# Patient Record
Sex: Female | Born: 1955 | Race: Black or African American | Hispanic: No | Marital: Single | Smoking: Current every day smoker
Health system: Southern US, Community
[De-identification: ages and names within clinical notes are randomized; demographics above are authoritative.]

## PROBLEM LIST (undated history)

## (undated) DIAGNOSIS — E669 Obesity, unspecified: Secondary | ICD-10-CM

## (undated) DIAGNOSIS — I10 Essential (primary) hypertension: Secondary | ICD-10-CM

## (undated) DIAGNOSIS — M109 Gout, unspecified: Secondary | ICD-10-CM

## (undated) DIAGNOSIS — E785 Hyperlipidemia, unspecified: Secondary | ICD-10-CM

---

## 1998-04-03 ENCOUNTER — Other Ambulatory Visit: Admission: RE | Admit: 1998-04-03 | Discharge: 1998-04-03 | Payer: Self-pay | Admitting: Family Medicine

## 2001-06-26 ENCOUNTER — Emergency Department (HOSPITAL_COMMUNITY): Admission: EM | Admit: 2001-06-26 | Discharge: 2001-06-26 | Payer: Self-pay | Admitting: Emergency Medicine

## 2001-06-26 ENCOUNTER — Encounter: Payer: Self-pay | Admitting: Emergency Medicine

## 2004-05-17 ENCOUNTER — Ambulatory Visit: Payer: Self-pay | Admitting: Family Medicine

## 2004-05-28 ENCOUNTER — Ambulatory Visit: Payer: Self-pay | Admitting: Family Medicine

## 2004-10-29 ENCOUNTER — Ambulatory Visit: Payer: Self-pay | Admitting: Nurse Practitioner

## 2004-12-30 ENCOUNTER — Ambulatory Visit: Payer: Self-pay | Admitting: Internal Medicine

## 2005-02-13 ENCOUNTER — Ambulatory Visit: Payer: Self-pay | Admitting: Family Medicine

## 2005-02-14 ENCOUNTER — Ambulatory Visit: Payer: Self-pay | Admitting: *Deleted

## 2005-02-24 ENCOUNTER — Ambulatory Visit: Payer: Self-pay | Admitting: Family Medicine

## 2005-05-21 ENCOUNTER — Ambulatory Visit: Payer: Self-pay | Admitting: Family Medicine

## 2005-09-16 ENCOUNTER — Ambulatory Visit: Payer: Self-pay | Admitting: Family Medicine

## 2006-03-23 ENCOUNTER — Ambulatory Visit: Payer: Self-pay | Admitting: Family Medicine

## 2006-07-27 ENCOUNTER — Ambulatory Visit: Payer: Self-pay | Admitting: Family Medicine

## 2006-08-18 ENCOUNTER — Ambulatory Visit: Payer: Self-pay | Admitting: Family Medicine

## 2006-09-25 ENCOUNTER — Emergency Department (HOSPITAL_COMMUNITY): Admission: EM | Admit: 2006-09-25 | Discharge: 2006-09-25 | Payer: Self-pay | Admitting: Emergency Medicine

## 2006-09-25 ENCOUNTER — Ambulatory Visit: Payer: Self-pay | Admitting: Family Medicine

## 2006-10-20 ENCOUNTER — Ambulatory Visit: Payer: Self-pay | Admitting: Family Medicine

## 2006-12-23 ENCOUNTER — Ambulatory Visit: Payer: Self-pay | Admitting: Family Medicine

## 2007-01-18 ENCOUNTER — Ambulatory Visit: Payer: Self-pay | Admitting: Family Medicine

## 2007-03-22 ENCOUNTER — Ambulatory Visit: Payer: Self-pay | Admitting: Internal Medicine

## 2007-07-14 ENCOUNTER — Ambulatory Visit: Payer: Self-pay | Admitting: Internal Medicine

## 2007-09-20 ENCOUNTER — Encounter (INDEPENDENT_AMBULATORY_CARE_PROVIDER_SITE_OTHER): Payer: Self-pay | Admitting: Family Medicine

## 2007-09-20 ENCOUNTER — Ambulatory Visit: Payer: Self-pay | Admitting: Internal Medicine

## 2007-09-20 LAB — CONVERTED CEMR LAB
ALT: 17 units/L (ref 0–35)
AST: 27 units/L (ref 0–37)
Albumin: 4 g/dL (ref 3.5–5.2)
Alkaline Phosphatase: 97 units/L (ref 39–117)
BUN: 34 mg/dL — ABNORMAL HIGH (ref 6–23)
CO2: 22 meq/L (ref 19–32)
Calcium: 9.5 mg/dL (ref 8.4–10.5)
Chloride: 106 meq/L (ref 96–112)
Cholesterol: 170 mg/dL (ref 0–200)
Creatinine, Ser: 0.98 mg/dL (ref 0.40–1.20)
Glucose, Bld: 214 mg/dL — ABNORMAL HIGH (ref 70–99)
HDL: 68 mg/dL (ref >39)
LDL Cholesterol: 75 mg/dL (ref 0–99)
Potassium: 5.2 meq/L (ref 3.5–5.3)
Sodium: 141 meq/L (ref 135–145)
Total Bilirubin: 0.4 mg/dL (ref 0.3–1.2)
Total CHOL/HDL Ratio: 2.5
Total Protein: 6.9 g/dL (ref 6.0–8.3)
Triglycerides: 136 mg/dL (ref <150)
VLDL: 27 mg/dL (ref 0–40)

## 2008-02-14 ENCOUNTER — Encounter (INDEPENDENT_AMBULATORY_CARE_PROVIDER_SITE_OTHER): Payer: Self-pay | Admitting: Family Medicine

## 2008-02-14 ENCOUNTER — Ambulatory Visit: Payer: Self-pay | Admitting: Internal Medicine

## 2008-02-14 LAB — CONVERTED CEMR LAB: Microalb, Ur: 21.7 mg/dL — ABNORMAL HIGH (ref 0.00–1.89)

## 2008-03-21 ENCOUNTER — Ambulatory Visit: Payer: Self-pay | Admitting: Internal Medicine

## 2008-04-17 ENCOUNTER — Ambulatory Visit: Payer: Self-pay | Admitting: Internal Medicine

## 2008-05-18 ENCOUNTER — Ambulatory Visit: Payer: Self-pay | Admitting: Internal Medicine

## 2008-10-16 ENCOUNTER — Emergency Department (HOSPITAL_COMMUNITY): Admission: EM | Admit: 2008-10-16 | Discharge: 2008-10-16 | Payer: Self-pay | Admitting: Emergency Medicine

## 2008-11-20 ENCOUNTER — Ambulatory Visit: Payer: Self-pay | Admitting: Internal Medicine

## 2010-10-06 ENCOUNTER — Encounter: Payer: Self-pay | Admitting: Internal Medicine

## 2010-10-07 ENCOUNTER — Encounter: Payer: Self-pay | Admitting: Internal Medicine

## 2010-10-08 ENCOUNTER — Ambulatory Visit (HOSPITAL_COMMUNITY)
Admission: RE | Admit: 2010-10-08 | Discharge: 2010-10-08 | Payer: Self-pay | Source: Home / Self Care | Attending: Internal Medicine | Admitting: Internal Medicine

## 2010-12-31 LAB — GLUCOSE, CAPILLARY
Glucose-Capillary: 194 mg/dL — ABNORMAL HIGH (ref 70–99)
Glucose-Capillary: 82 mg/dL (ref 70–99)

## 2011-08-22 ENCOUNTER — Encounter: Payer: Self-pay | Admitting: *Deleted

## 2011-08-22 ENCOUNTER — Emergency Department (HOSPITAL_COMMUNITY): Payer: Self-pay

## 2011-08-22 ENCOUNTER — Emergency Department (HOSPITAL_COMMUNITY)
Admission: EM | Admit: 2011-08-22 | Discharge: 2011-08-22 | Disposition: A | Discharge status: Home / Self Care | Payer: Self-pay | Attending: Emergency Medicine | Admitting: Emergency Medicine

## 2011-08-22 DIAGNOSIS — E785 Hyperlipidemia, unspecified: Secondary | ICD-10-CM | POA: Insufficient documentation

## 2011-08-22 DIAGNOSIS — Z794 Long term (current) use of insulin: Secondary | ICD-10-CM | POA: Insufficient documentation

## 2011-08-22 DIAGNOSIS — M25461 Effusion, right knee: Secondary | ICD-10-CM

## 2011-08-22 DIAGNOSIS — M25469 Effusion, unspecified knee: Secondary | ICD-10-CM | POA: Insufficient documentation

## 2011-08-22 DIAGNOSIS — E119 Type 2 diabetes mellitus without complications: Secondary | ICD-10-CM | POA: Insufficient documentation

## 2011-08-22 DIAGNOSIS — M25569 Pain in unspecified knee: Secondary | ICD-10-CM | POA: Insufficient documentation

## 2011-08-22 DIAGNOSIS — I10 Essential (primary) hypertension: Secondary | ICD-10-CM | POA: Insufficient documentation

## 2011-08-22 DIAGNOSIS — M7989 Other specified soft tissue disorders: Secondary | ICD-10-CM | POA: Insufficient documentation

## 2011-08-22 HISTORY — DX: Essential (primary) hypertension: I10

## 2011-08-22 HISTORY — DX: Hyperlipidemia, unspecified: E78.5

## 2011-08-22 MED ORDER — HYDROCODONE-ACETAMINOPHEN 5-325 MG PO TABS: 1.0000 | ORAL_TABLET | ORAL | Status: AC | PRN

## 2011-08-22 NOTE — ED Notes (Signed)
Patient reports she has fluid on her right knee for the past 1 mth

## 2011-08-22 NOTE — ED Provider Notes (Signed)
History     CSN: [id number] Arrival date & time: 08/22/2011 11:01 AM   First MD Initiated Contact with Patient 08/22/11 1150      Chief Complaint  Patient presents with  . Knee Pain    (Consider location/radiation/quality/duration/timing/severity/associated sxs/prior treatment) Patient is a 55 y.o. female presenting with knee pain. The history is provided by the patient.  Knee Pain This is a recurrent problem. The current episode started 1 to 4 weeks ago. The problem has been gradually worsening. Associated symptoms include joint swelling. The symptoms are aggravated by walking.   Chronic right knee swelling, no recent injury. Past Medical History  Diagnosis Date  . Diabetes mellitus   . Hypertension   . Hyperlipemia     History reviewed. No pertinent past surgical history.  No family history on file.  History  Substance Use Topics  . Smoking status: Current Everyday Smoker  . Smokeless tobacco: Not on file  . Alcohol Use: Yes    OB History    Grav Para Term Preterm Abortions TAB SAB Ect Mult Living                  Review of Systems  Musculoskeletal: Positive for joint swelling.  All other systems reviewed and are negative.    Allergies  Review of patient's allergies indicates no known allergies.  Home Medications   Current Outpatient Rx  Name Route Sig Dispense Refill  . GLIMEPIRIDE 2 MG PO TABS Oral Take 2 mg by mouth 2 (two) times daily.      . IBUPROFEN 600 MG PO TABS Oral Take 600 mg by mouth every 6 (six) hours as needed. Swelling, pain     . INSULIN GLARGINE 100 UNIT/ML [place] SOLN Subcutaneous Inject 25-30 Units into the skin at bedtime. Will use 30 units if sugar has been high all day.     Marland Kitchen NAPROXEN 500 MG PO TABS Oral Take 500 mg by mouth 2 (two) times daily as needed. For pain      . NEBIVOLOL HCL 5 MG PO TABS Oral Take 5 mg by mouth daily.      Marland Kitchen PIOGLITAZONE HCL-METFORMIN HCL 15-500 MG PO TABS Oral Take 1 tablet by mouth 2 (two) times daily  with a meal.        BP 155/86  Pulse 80  Temp(Src) 98.1 F (36.7 C) (Oral)  Resp 18  SpO2 96%  Physical Exam  Nursing note and vitals reviewed. Constitutional: She is oriented to person, place, and time. She appears well-developed and well-nourished.  HENT:  Head: Normocephalic and atraumatic.  Eyes: Conjunctivae are normal. Pupils are equal, round, and reactive to light.  Neck: Normal range of motion. Neck supple.  Pulmonary/Chest: Effort normal and breath sounds normal.  Abdominal: Soft. Bowel sounds are normal.  Musculoskeletal: Normal range of motion. She exhibits edema.  Neurological: She is alert and oriented to person, place, and time.  Skin: Skin is warm and dry.  Psychiatric: She has a normal mood and affect.    ED Course  Procedures (including critical care time)  Labs Reviewed - No data to display Dg Knee Complete 4 Views Right  08/22/2011  *RADIOLOGY REPORT*  Clinical Data: Recent fall.  Knee pain and swelling.  RIGHT KNEE - COMPLETE 4+ VIEW  Comparison: None.  Findings: No evidence of fracture or dislocation.  A large knee joint effusion is seen.  Mild degenerative spurring and joint space narrowing is seen involving the medial compartment. No other significant bone  abnormality identified.  Generalized osteopenia noted.  IMPRESSION:  1.  Large knee joint effusion. 2.  Mild medial compartment osteoarthritis. 3.  Osteopenia.  Original Report Authenticated By: Danae Orleans, M.D.     No diagnosis found.  Radiology results reviewed and discussed with patient and with Dr. Jeraldine Loots.  Referral made to orthopedics. Patient may also follow-up with her PCP as needed.  MDM          Jimmye Norman, NP 08/22/11 2351

## 2011-08-22 NOTE — ED Notes (Signed)
States she has fluid on her knee and it aches. Was given ibuprofen. States she has taken all of meds but the fluid is still there and it aches. History of rheumatoid arthritis

## 2011-08-25 NOTE — ED Provider Notes (Signed)
Medical screening examination/treatment/procedure(s) were performed by non-physician practitioner and as supervising physician I was immediately available for consultation/collaboration.   Saloma Cadena, MD 08/25/11 0749 

## 2011-10-26 ENCOUNTER — Observation Stay (HOSPITAL_COMMUNITY)
Admission: EM | Admit: 2011-10-26 | Discharge: 2011-10-27 | Disposition: A | Discharge status: Home / Self Care | Payer: PRIVATE HEALTH INSURANCE | Attending: Internal Medicine | Admitting: Internal Medicine

## 2011-10-26 ENCOUNTER — Other Ambulatory Visit: Payer: Self-pay

## 2011-10-26 ENCOUNTER — Emergency Department (HOSPITAL_COMMUNITY): Payer: PRIVATE HEALTH INSURANCE

## 2011-10-26 DIAGNOSIS — R51 Headache: Secondary | ICD-10-CM | POA: Insufficient documentation

## 2011-10-26 DIAGNOSIS — M6281 Muscle weakness (generalized): Secondary | ICD-10-CM | POA: Insufficient documentation

## 2011-10-26 DIAGNOSIS — I10 Essential (primary) hypertension: Secondary | ICD-10-CM | POA: Diagnosis present

## 2011-10-26 DIAGNOSIS — E162 Hypoglycemia, unspecified: Secondary | ICD-10-CM | POA: Diagnosis present

## 2011-10-26 DIAGNOSIS — Z794 Long term (current) use of insulin: Secondary | ICD-10-CM | POA: Insufficient documentation

## 2011-10-26 DIAGNOSIS — E875 Hyperkalemia: Secondary | ICD-10-CM | POA: Diagnosis present

## 2011-10-26 DIAGNOSIS — E119 Type 2 diabetes mellitus without complications: Principal | ICD-10-CM | POA: Diagnosis present

## 2011-10-26 LAB — URINALYSIS, ROUTINE W REFLEX MICROSCOPIC
Bilirubin Urine: NEGATIVE
Glucose, UA: NEGATIVE mg/dL
Ketones, ur: NEGATIVE mg/dL
Nitrite: NEGATIVE
Protein, ur: 30 mg/dL — AB
Specific Gravity, Urine: 1.014 (ref 1.005–1.030)
Urobilinogen, UA: 0.2 mg/dL (ref 0.0–1.0)
pH: 6 (ref 5.0–8.0)

## 2011-10-26 LAB — BASIC METABOLIC PANEL
Chloride: 105 mEq/L (ref 96–112)
GFR calc Af Amer: 68 mL/min — ABNORMAL LOW (ref >90)
Potassium: 5.6 mEq/L — ABNORMAL HIGH (ref 3.5–5.1)

## 2011-10-26 LAB — CBC
HCT: 34.9 % — ABNORMAL LOW (ref 36.0–46.0)
HCT: 33.2 % — ABNORMAL LOW (ref 36.0–46.0)
Hemoglobin: 11.5 g/dL — ABNORMAL LOW (ref 12.0–15.0)
Hemoglobin: 10.9 g/dL — ABNORMAL LOW (ref 12.0–15.0)
MCH: 30.7 pg (ref 26.0–34.0)
MCHC: 33 g/dL (ref 30.0–36.0)
MCV: 93.3 fL (ref 78.0–100.0)
Platelets: 218 10*3/uL (ref 150–400)
RBC: 3.74 MIL/uL — ABNORMAL LOW (ref 3.87–5.11)
RBC: 3.6 MIL/uL — ABNORMAL LOW (ref 3.87–5.11)
RDW: 14 % (ref 11.5–15.5)
WBC: 5.9 10*3/uL (ref 4.0–10.5)
WBC: 5.5 10*3/uL (ref 4.0–10.5)

## 2011-10-26 LAB — GLUCOSE, CAPILLARY
Glucose-Capillary: 82 mg/dL (ref 70–99)
Glucose-Capillary: 31 mg/dL — CL (ref 70–99)
Glucose-Capillary: 173 mg/dL — ABNORMAL HIGH (ref 70–99)

## 2011-10-26 LAB — DIFFERENTIAL
Basophils Absolute: 0 10*3/uL (ref 0.0–0.1)
Basophils Relative: 0 % (ref 0–1)
Eosinophils Absolute: 0.1 10*3/uL (ref 0.0–0.7)
Eosinophils Relative: 1 % (ref 0–5)
Lymphocytes Relative: 26 % (ref 12–46)
Lymphs Abs: 1.5 10*3/uL (ref 0.7–4.0)
Monocytes Absolute: 0.4 10*3/uL (ref 0.1–1.0)
Monocytes Relative: 7 % (ref 3–12)
Neutro Abs: 3.9 10*3/uL (ref 1.7–7.7)
Neutrophils Relative %: 66 % (ref 43–77)

## 2011-10-26 LAB — LIPID PANEL
Cholesterol: 205 mg/dL — ABNORMAL HIGH (ref 0–200)
HDL: 82 mg/dL (ref >39)
Total CHOL/HDL Ratio: 2.5 RATIO
VLDL: 12 mg/dL (ref 0–40)

## 2011-10-26 LAB — CREATININE, SERUM: GFR calc non Af Amer: 59 mL/min — ABNORMAL LOW (ref >90)

## 2011-10-26 LAB — URINE MICROSCOPIC-ADD ON

## 2011-10-26 MED ORDER — ACETAMINOPHEN 325 MG PO TABS: 650.0000 mg | ORAL_TABLET | Freq: Four times a day (QID) | ORAL | Status: DC | PRN

## 2011-10-26 MED ORDER — ONDANSETRON HCL 4 MG PO TABS: 4.0000 mg | ORAL_TABLET | Freq: Four times a day (QID) | ORAL | Status: DC | PRN

## 2011-10-26 MED ORDER — DEXTROSE 50 % IV SOLN
INTRAVENOUS | Status: AC
  Filled 2011-10-26: qty 50

## 2011-10-26 MED ORDER — ONDANSETRON HCL 4 MG/2ML IJ SOLN: 4.0000 mg | Freq: Three times a day (TID) | INTRAMUSCULAR | Status: AC | PRN

## 2011-10-26 MED ORDER — HYDRALAZINE HCL 20 MG/ML IJ SOLN
10.0000 mg | Freq: Four times a day (QID) | INTRAMUSCULAR | Status: DC | PRN
  Filled 2011-10-26: qty 0.5

## 2011-10-26 MED ORDER — DEXTROSE-NACL 5-0.9 % IV SOLN
INTRAVENOUS | Status: DC
  Administered 2011-10-26: 60 mL/h via INTRAVENOUS
  Administered 2011-10-27: 08:00:00 via INTRAVENOUS

## 2011-10-26 MED ORDER — ONDANSETRON HCL 4 MG/2ML IJ SOLN: 4.0000 mg | Freq: Four times a day (QID) | INTRAMUSCULAR | Status: DC | PRN

## 2011-10-26 MED ORDER — ALBUTEROL SULFATE (5 MG/ML) 0.5% IN NEBU: 2.5000 mg | INHALATION_SOLUTION | RESPIRATORY_TRACT | Status: DC | PRN

## 2011-10-26 MED ORDER — DEXTROSE 50 % IV SOLN
INTRAVENOUS | Status: AC
  Administered 2011-10-26: 16:00:00
  Filled 2011-10-26: qty 50

## 2011-10-26 MED ORDER — DEXTROSE-NACL 5-0.45 % IV SOLN: INTRAVENOUS | Status: DC

## 2011-10-26 MED ORDER — HYDROCODONE-ACETAMINOPHEN 5-325 MG PO TABS: 1.0000 | ORAL_TABLET | ORAL | Status: DC | PRN

## 2011-10-26 MED ORDER — SODIUM POLYSTYRENE SULFONATE 15 GM/60ML PO SUSP
30.0000 g | Freq: Once | ORAL | Status: AC
  Administered 2011-10-26: 30 g via ORAL
  Filled 2011-10-26: qty 120

## 2011-10-26 MED ORDER — KCL IN DEXTROSE-NACL 20-5-0.45 MEQ/L-%-% IV SOLN: Freq: Once | INTRAVENOUS | Status: DC

## 2011-10-26 MED ORDER — DEXTROSE 50 % IV SOLN: 25.0000 g | Freq: Once | INTRAVENOUS | Status: DC

## 2011-10-26 MED ORDER — ASPIRIN EC 81 MG PO TBEC
81.0000 mg | DELAYED_RELEASE_TABLET | Freq: Every day | ORAL | Status: DC
  Administered 2011-10-26 – 2011-10-27 (×2): 81 mg via ORAL
  Filled 2011-10-26 (×2): qty 1

## 2011-10-26 MED ORDER — ACETAMINOPHEN 650 MG RE SUPP: 650.0000 mg | Freq: Four times a day (QID) | RECTAL | Status: DC | PRN

## 2011-10-26 MED ORDER — ENOXAPARIN SODIUM 40 MG/0.4ML ~~LOC~~ SOLN
40.0000 mg | SUBCUTANEOUS | Status: DC
  Administered 2011-10-26: 40 mg via SUBCUTANEOUS
  Filled 2011-10-26 (×2): qty 0.4

## 2011-10-26 MED ORDER — NEBIVOLOL HCL 5 MG PO TABS
5.0000 mg | ORAL_TABLET | Freq: Every day | ORAL | Status: DC
  Administered 2011-10-26 – 2011-10-27 (×2): 5 mg via ORAL
  Filled 2011-10-26 (×2): qty 1

## 2011-10-26 NOTE — ED Notes (Signed)
Pt's CBG was 173. 4:45pm JG.

## 2011-10-26 NOTE — ED Provider Notes (Signed)
Medical screening examination/treatment/procedure(s) were conducted as a shared visit with non-physician practitioner(s) and myself.  I personally evaluated the patient during the encounter  Difficult to obtain a history. On sure the exact reason why she is in emergency department however states that her sugar was low this morning. She has a history of diabetes. She takes Lantus, glyburide, pioglitazone - all of which are long-acting agents. Her last weakness at home. EMS gave her half amp of D50 for a blood glucose of 53. On arrival to the emergency department her blood sugar was 82.  Patient exhibited some odd behavior otherwise her examination was relatively unremarkable  For study show patient was hypoglycemic. Her blood glucose continued to trend down in the emergency department. Reached a low of 30 which time she was given an amp of D50. Given the fact alterations are long-acting sure or admission to the hospital for further observation. She is placed on a D5 half-normal saline IV solution. She'll be fed. Discussed with the [facility] hospitalist  Dayton Bailiff, MD 10/26/11 1627

## 2011-10-26 NOTE — ED Notes (Signed)
According to family, "slept 2 hrs. More than normally." she is diabetic - 53 cbg. EMS gave 1/2 amp D50. ?slight, slight lt. Side weakness, tardive dyskinesia, inc. Alertness. Last seen normal @ bedtime.

## 2011-10-26 NOTE — ED Notes (Signed)
Pt's CBG was 31 I notified the RN Ed and the PA [place]. I also gave the pt 2 small containers of orange juice to drink in front of me.4:05 pm JG.

## 2011-10-26 NOTE — ED Notes (Signed)
Admitting Dr. At bedside.  Report called to [facility], RN

## 2011-10-26 NOTE — H&P (Signed)
PATIENT DETAILS Name: Gina Cox Age: 56 y.o. Sex: female Date of Birth: 1956/09/03 Admit Date: 10/26/2011 ZOX:WRUEAVW,UJWJX A, MD, MD   CHIEF COMPLAINT:  "Feeling woozy"  HPI: Patient is a 57 year old African American female with a past medical history of diabetes, hypertension, dyslipidemia, obesity who was brought to the emergency room by EMS for the above-noted complaints. Patient is a poor historian and does not exactly recollect all her symptoms this morning, however she does seem to recall that ever since he woke up she was feeling "woozy". She claims that since she was just not acting right abortion called EMS and she was brought to the emergency room for further evaluation and treatment. Patient denies any syncopal episodes. She claims that she did not eat any breakfast nor lunch today. When EMS arrived her initial blood sugar was 53 and was given one half amp of D50, upon arrival in the ED her sugar was 82. Unfortunately given other supportive care her sugar still went back down to 31, the hospitalist service was then asked to admit this patient for further continued observation and monitoring. Patient claims she has been compliant to her medications, and and claims that she does not think she mistakenly or accidentally took double dosing. Denies any complaints such as headache chest pain shortness of breath abdominal pain or nausea vomiting or diarrhea   ALLERGIES:  No Known Allergies  PAST MEDICAL HISTORY: Past Medical History  Diagnosis Date  . Diabetes mellitus   . Hypertension   . Hyperlipemia     PAST SURGICAL HISTORY: No past surgical history on file.  MEDICATIONS AT HOME: Prior to Admission medications   Medication Sig Start Date End Date Taking? Authorizing Provider  glimepiride (AMARYL) 2 MG tablet Take 2 mg by mouth 2 (two) times daily.     Yes Historical Provider, MD  ibuprofen (ADVIL,MOTRIN) 600 MG tablet Take 600 mg by mouth every 6 (six) hours as needed.  Swelling, pain    Yes Historical Provider, MD  insulin glargine (LANTUS) 100 UNIT/ML injection Inject 25-30 Units into the skin at bedtime. Will use 30 units if sugar has been high all day.    Yes Historical Provider, MD  naproxen (NAPROSYN) 500 MG tablet Take 500 mg by mouth 2 (two) times daily as needed. For pain     Yes Historical Provider, MD  nebivolol (BYSTOLIC) 5 MG tablet Take 5 mg by mouth daily.   Yes Historical Provider, MD  pioglitazone-metformin (ACTOPLUS MET) 15-500 MG per tablet Take 1 tablet by mouth 2 (two) times daily with a meal.     Yes Historical Provider, MD    FAMILY HISTORY: No family history on file.  SOCIAL HISTORY:  reports that she has been smoking.  She does not have any smokeless tobacco history on file. She reports that she drinks alcohol. She reports that she does not use illicit drugs.  REVIEW OF SYSTEMS:  Constitutional:   No  weight loss, night sweats,  Fevers, chills, fatigue.  HEENT:    No headaches, Difficulty swallowing,Tooth/dental problems,Sore throat,  No sneezing, itching, ear ache, nasal congestion, post nasal drip,   Cardio-vascular: No chest pain,  Orthopnea, PND, swelling in lower extremities, anasarca,  dizziness, palpitations  GI:  No heartburn, indigestion, abdominal pain, nausea, vomiting, diarrhea, change in  bowel habits, loss of appetite  Resp: No shortness of breath with exertion or at rest.  No excess mucus, no productive cough, No non-productive cough,  No coughing up of blood.No change in  color of mucus.No wheezing.No chest wall deformity  Skin:  no rash or lesions.  GU:  no dysuria, change in color of urine, no urgency or frequency.  No flank pain.  Musculoskeletal: No joint pain or swelling.  No decreased range of motion.  No back pain.  Psych: No change in mood or affect. No depression or anxiety.  No memory loss.   PHYSICAL EXAM: Blood pressure 171/90, pulse 76, temperature 97.4 F (36.3 C), temperature source  Oral, resp. rate 15, SpO2 99.00%.  General appearance :Awake, alert, not in any distress. Speech Clear. Not toxic Looking HEENT: Atraumatic and Normocephalic, pupils equally reactive to light and accomodation Neck: supple, no JVD. No cervical lymphadenopathy.  Chest:Good air entry bilaterally, no added sounds  CVS: S1 S2 regular, no murmurs.  Abdomen: Bowel sounds present, Non tender and not distended with no gaurding, rigidity or rebound. Extremities: B/L Lower Ext shows no edema, both legs are warm to touch, with  dorsalis pedis pulses palpable. Neurology: Awake alert, and oriented X 3, CN II-XII intact, Non focal Skin:No Rash Wounds:N/A  LABS ON ADMISSION:   Basename 10/26/11 1405  NA 137  K 5.6*  CL 105  CO2 29  GLUCOSE 69*  BUN 25*  CREATININE 1.04  CALCIUM 9.6  MG --  PHOS --   No results found for this basename: AST:2,ALT:2,ALKPHOS:2,BILITOT:2,PROT:2,ALBUMIN:2 in the last 72 hours No results found for this basename: LIPASE:2,AMYLASE:2 in the last 72 hours  Basename 10/26/11 1405  WBC 5.9  NEUTROABS 3.9  HGB 11.5*  HCT 34.9*  MCV 93.3  PLT 218   No results found for this basename: CKTOTAL:3,CKMB:3,CKMBINDEX:3,TROPONINI:3 in the last 72 hours No results found for this basename: DDIMER:2 in the last 72 hours No components found with this basename: POCBNP:3   RADIOLOGIC STUDIES ON ADMISSION: Ct Head Wo Contrast  10/26/2011  *RADIOLOGY REPORT*  Clinical Data: Weakness.  Rule out CVA  CT HEAD WITHOUT CONTRAST  Technique:  Contiguous axial images were obtained from the base of the skull through the vertex without contrast.  Comparison: None  Findings: Mild to moderate atrophy for age.  Chronic microvascular ischemic change in the white matter bilaterally.  No acute infarct. Negative for hemorrhage or mass.  Calvarium is intact.  IMPRESSION: Atrophy and chronic microvascular ischemia.  No acute abnormality.  Original Report Authenticated By: Camelia Phenes, M.D.     ASSESSMENT AND PLAN: Present on Admission:  .Hypoglycemia -This is due to the fact that the patient is on Lantus and sulfonylureas, and she apparently likes to skip breakfast and other meals. Her daughter at bedside claims she does not eat a good meal and only tends to nibble. -For now will place her on D5 normal saline and hold all her Lantus insulin and oral diabetic medication -Check CBGs q. 4 hours  -If her CBGs state persistently above 200 then perhaps we can start low-dose sliding scale insulin   .DM (diabetes mellitus) -Check HbA1c  -As above   .HTN (hypertension) -Resume bisoprolol  -As needed intravenous hydralazine   .Hyperkalemia -This is mild -Will order one dosing off Kayexalate -Recheck be made later this evening  Further plan will depend as patient's clinical course evolves and further radiologic and laboratory data become available. Patient will be monitored closely.  DVT Prophylaxis: Lovenox  Code Status: Full code  Total time spent for admission equals 45 minutes.  Jeoffrey Massed 10/26/2011, 5:16 PM

## 2011-10-26 NOTE — ED Notes (Signed)
Completed CBG it was 84. 1:08 pm JG

## 2011-10-26 NOTE — ED Notes (Signed)
Gave old and new ECG to Dr. Brooke Dare after I performed. 1:18pm JG

## 2011-10-26 NOTE — ED Provider Notes (Addendum)
History     CSN: [id number]  Arrival date & time 10/26/11  1246   First MD Initiated Contact with Patient 10/26/11 1248      Chief Complaint  Patient presents with  . Weakness    (Consider location/radiation/quality/duration/timing/severity/associated sxs/prior treatment) HPI Comments: Patient reports that she is unsure why she is here in the ED today.  Patient difficult to obtain a good history from.  PMH significant for IDDM.  She is currently on insulin, but is unsure what dose of insulin she takes or what type of insulin she is on.  She reports that she does monitor her blood sugar at home, but is unsure what the numbers have been.  She denies any symptoms at this time aside from generalized weakness and slight headache.  Review of the chart shows that according to EMS her blood sugar when they arrived at the home was 53 and was given one half amp D50.  Upon arrival in the ED her blood sugar was 82.  Patient is a 56 y.o. female presenting with weakness. The history is provided by the patient.  Weakness Primary symptoms do not include headaches, syncope, dizziness, focal weakness, fever, nausea or vomiting.  Additional symptoms include weakness. Medical issues also include diabetes.    Past Medical History  Diagnosis Date  . Diabetes mellitus   . Hypertension   . Hyperlipemia     No past surgical history on file.  No family history on file.  History  Substance Use Topics  . Smoking status: Current Everyday Smoker  . Smokeless tobacco: Not on file  . Alcohol Use: Yes    OB History    Grav Para Term Preterm Abortions TAB SAB Ect Mult Living                  Review of Systems  Constitutional: Negative for fever and chills.  Eyes: Negative for visual disturbance.  Respiratory: Negative for chest tightness and shortness of breath.   Cardiovascular: Negative for chest pain and syncope.  Gastrointestinal: Negative for nausea, vomiting and abdominal pain.  Skin:  Negative for rash.  Neurological: Positive for weakness. Negative for dizziness, focal weakness, syncope, light-headedness and headaches.    Allergies  Review of patient's allergies indicates no known allergies.  Home Medications   Current Outpatient Rx  Name Route Sig Dispense Refill  . GLIMEPIRIDE 2 MG PO TABS Oral Take 2 mg by mouth 2 (two) times daily.      . IBUPROFEN 600 MG PO TABS Oral Take 600 mg by mouth every 6 (six) hours as needed. Swelling, pain     . INSULIN GLARGINE 100 UNIT/ML [place] SOLN Subcutaneous Inject 25-30 Units into the skin at bedtime. Will use 30 units if sugar has been high all day.     Marland Kitchen NAPROXEN 500 MG PO TABS Oral Take 500 mg by mouth 2 (two) times daily as needed. For pain      . NEBIVOLOL HCL 5 MG PO TABS Oral Take 5 mg by mouth daily.      Marland Kitchen PIOGLITAZONE HCL-METFORMIN HCL 15-500 MG PO TABS Oral Take 1 tablet by mouth 2 (two) times daily with a meal.        BP 171/90  Pulse 76  Temp(Src) 97.4 F (36.3 C) (Oral)  Resp 15  SpO2 99%  Physical Exam  Nursing note and vitals reviewed. Constitutional: She is oriented to person, place, and time. She appears well-developed and well-nourished. No distress.  Neck: Normal range  of motion. Neck supple.  Cardiovascular: Normal rate, regular rhythm and normal heart sounds.   Pulmonary/Chest: Effort normal and breath sounds normal.  Abdominal: Soft. Bowel sounds are normal. She exhibits no distension and no mass. There is no tenderness. There is no rebound and no guarding.  Neurological: She is alert and oriented to person, place, and time. She has normal strength and normal reflexes. No cranial nerve deficit or sensory deficit. Coordination and gait normal.       Normal finger to nose testing. Normal Rapid alternating movements.  Skin: Skin is warm and dry. She is not diaphoretic.  Psychiatric: She has a normal mood and affect.    ED Course  Procedures (including critical care time)   Labs Reviewed    GLUCOSE, CAPILLARY  CBC  DIFFERENTIAL  BASIC METABOLIC PANEL  URINALYSIS, ROUTINE W REFLEX MICROSCOPIC   Ct Head Wo Contrast  10/26/2011  *RADIOLOGY REPORT*  Clinical Data: Weakness.  Rule out CVA  CT HEAD WITHOUT CONTRAST  Technique:  Contiguous axial images were obtained from the base of the skull through the vertex without contrast.  Comparison: None  Findings: Mild to moderate atrophy for age.  Chronic microvascular ischemic change in the white matter bilaterally.  No acute infarct. Negative for hemorrhage or mass.  Calvarium is intact.  IMPRESSION: Atrophy and chronic microvascular ischemia.  No acute abnormality.  Original Report Authenticated By: Camelia Phenes, M.D.     No diagnosis found.   Date: 10/26/2011  Rate: 75  Rhythm: normal sinus rhythm  QRS Axis: normal  Intervals: normal  ST/T Wave abnormalities: normal  Conduction Disutrbances:none  Narrative Interpretation:   Old EKG Reviewed: unchanged  3:02 PM Reassessed patient.  She is now more alert and is talking and laughing with her friend.  She denies any symptoms at this time. 4:00PM Repeat blood sugar showed a blood sugar of 32.  Patient was given juice and an AMP of D50.   Patient discussed with [facility] who will admit to Team 1. MDM  Patient was found by EMS to have a blood sugar of 53.  She was given a half AMP of D50.  Her blood sugar than rose to 82.  While in the ED her blood sugar decreased back down to 69.  She is unsure of her diabetes medication.  However, it is listed that she is on Glimepivide, Insulin Glargine, and Pioglitazone HCl-Metformin HCl daily.  Will admit patient for hypoglycemia.        Pascal Lux [employer], PA-C 10/26/11 1624  Pascal Lux [place], PA-C 10/26/11 [phone]

## 2011-10-27 LAB — HEMOGLOBIN A1C: Mean Plasma Glucose: 183 mg/dL — ABNORMAL HIGH (ref <117)

## 2011-10-27 LAB — BASIC METABOLIC PANEL
BUN: 20 mg/dL (ref 6–23)
BUN: 18 mg/dL (ref 6–23)
CO2: 24 mEq/L (ref 19–32)
Calcium: 9.1 mg/dL (ref 8.4–10.5)
Chloride: 106 mEq/L (ref 96–112)
GFR calc Af Amer: 76 mL/min — ABNORMAL LOW (ref >90)
GFR calc non Af Amer: 65 mL/min — ABNORMAL LOW (ref >90)
Glucose, Bld: 101 mg/dL — ABNORMAL HIGH (ref 70–99)
Potassium: 4.3 mEq/L (ref 3.5–5.1)
Sodium: 136 mEq/L (ref 135–145)
Sodium: 143 mEq/L (ref 135–145)

## 2011-10-27 LAB — CBC
HCT: 34.8 % — ABNORMAL LOW (ref 36.0–46.0)
Hemoglobin: 11.3 g/dL — ABNORMAL LOW (ref 12.0–15.0)
MCH: 30.4 pg (ref 26.0–34.0)
MCHC: 32.5 g/dL (ref 30.0–36.0)
RBC: 3.72 MIL/uL — ABNORMAL LOW (ref 3.87–5.11)

## 2011-10-27 LAB — GLUCOSE, CAPILLARY: Glucose-Capillary: 148 mg/dL — ABNORMAL HIGH (ref 70–99)

## 2011-10-27 NOTE — Progress Notes (Signed)
   CARE MANAGEMENT NOTE 10/27/2011  Patient:  Gina Cox, Gina Cox   Account Number:  [id number]  Date Initiated:  10/27/2011  Documentation initiated by:  Donn Pierini  Subjective/Objective Assessment:   Pt admitted with hypoglycemia     Action/Plan:   PTA pt lived at home alone, was independnet with ADLs   Anticipated DC Date:  10/27/2011   Anticipated DC Plan:  HOME/SELF CARE      DC Planning Services  CM consult      Choice offered to / List presented to:             Status of service:  Completed, signed off Medicare Important Message given?   (If response is "NO", the following Medicare IM given date fields will be blank) Date Medicare IM given:   Date Additional Medicare IM given:    Discharge Disposition:  HOME/SELF CARE  Per UR Regulation:    Comments:  PCP- [place]  10/27/11- 1125- Donn Pierini RN, BSN [phone] Spoke with pt at bedside- per conversation pt states that she is independent at home and goes to [facility]  on [address] for her PCP - Gina Cox. She uses [facility] for her medications and states that she doesnot have any difficulty getting her medications. No anticipated d/c needs. CM to follow.

## 2011-10-27 NOTE — Discharge Summary (Signed)
Discharge Summary  Gina Cox MR#: [id number]  DOB:09/24/1955  Date of Admission: 10/26/2011 Date of Discharge: 10/27/2011  Patient's PCP: Dorrene German, MD, MD  Attending Physician:Jayland Null    Discharge Diagnoses:   *Hypoglycemia  DM (diabetes mellitus)  HTN (hypertension)  Hyperkalemia   Brief Admitting History and Physical Patient is a 56 year old African American female with a past medical history of diabetes, hypertension, dyslipidemia, obesity who was brought to the emergency room by EMS for the above-noted complaints. Patient is a poor historian and does not exactly recollect all her symptoms this morning, however she does seem to recall that ever since he woke up she was feeling "woozy". She claims that since she was just not acting right abortion called EMS and she was brought to the emergency room for further evaluation and treatment. Patient denies any syncopal episodes. She claims that she did not eat any breakfast nor lunch today. When EMS arrived her initial blood sugar was 53 and was given one half amp of D50, upon arrival in the ED her sugar was 82. Unfortunately given other supportive care her sugar still went back down to 31, the hospitalist service was then asked to admit this patient for further continued observation and monitoring.  Patient claims she has been compliant to her medications, and and claims that she does not think she mistakenly or accidentally took double dosing.  Denies any complaints such as headache chest pain shortness of breath abdominal pain or nausea vomiting or diarrhea   Discharge Medications Medication List  As of 10/27/2011 12:18 PM   STOP taking these medications         glimepiride 2 MG tablet      insulin glargine 100 UNIT/ML injection         TAKE these medications         ibuprofen 600 MG tablet   Commonly known as: ADVIL,MOTRIN   Take 600 mg by mouth every 6 (six) hours as needed. Swelling, pain      naproxen 500  MG tablet   Commonly known as: NAPROSYN   Take 500 mg by mouth 2 (two) times daily as needed. For pain        nebivolol 5 MG tablet   Commonly known as: BYSTOLIC   Take 5 mg by mouth daily.      pioglitazone-metformin 15-500 MG per tablet   Commonly known as: ACTOPLUS MET   Take 1 tablet by mouth 2 (two) times daily with a meal.            Hospital Course: Hypoglycemia- patient relays that she skipped meal and thinks this is why her BS went low, I have held some of her mediciations til she returns to her PCP- I have also referred her to out patient diabetic eduation .DM (diabetes mellitus)- resume metformin combo and hold others til seen by PCP .HTN (hypertension)- stable .Hyperkalemia- resolved with K exelate   Day of Discharge BP 124/66  Pulse 76  Temp(Src) 98.1 F (36.7 C) (Oral)  Resp 16  SpO2 99%  Results for orders placed during the hospital encounter of 10/26/11 (from the past 48 hour(s))  GLUCOSE, CAPILLARY     Status: Normal   Collection Time   10/26/11  1:05 PM      Component Value Range Comment   Glucose-Capillary 82  70 - 99 (mg/dL)   CBC     Status: Abnormal   Collection Time   10/26/11  2:05 PM  Component Value Range Comment   WBC 5.9  4.0 - 10.5 (K/uL)    RBC 3.74 (*) 3.87 - 5.11 (MIL/uL)    Hemoglobin 11.5 (*) 12.0 - 15.0 (g/dL)    HCT 16.1 (*) 09.6 - 46.0 (%)    MCV 93.3  78.0 - 100.0 (fL)    MCH 30.7  26.0 - 34.0 (pg)    MCHC 33.0  30.0 - 36.0 (g/dL)    RDW 04.5  40.9 - 81.1 (%)    Platelets 218  150 - 400 (K/uL)   DIFFERENTIAL     Status: Normal   Collection Time   10/26/11  2:05 PM      Component Value Range Comment   Neutrophils Relative 66  43 - 77 (%)    Neutro Abs 3.9  1.7 - 7.7 (K/uL)    Lymphocytes Relative 26  12 - 46 (%)    Lymphs Abs 1.5  0.7 - 4.0 (K/uL)    Monocytes Relative 7  3 - 12 (%)    Monocytes Absolute 0.4  0.1 - 1.0 (K/uL)    Eosinophils Relative 1  0 - 5 (%)    Eosinophils Absolute 0.1  0.0 - 0.7 (K/uL)     Basophils Relative 0  0 - 1 (%)    Basophils Absolute 0.0  0.0 - 0.1 (K/uL)   BASIC METABOLIC PANEL     Status: Abnormal   Collection Time   10/26/11  2:05 PM      Component Value Range Comment   Sodium 137  135 - 145 (mEq/L)    Potassium 5.6 (*) 3.5 - 5.1 (mEq/L)    Chloride 105  96 - 112 (mEq/L)    CO2 29  19 - 32 (mEq/L)    Glucose, Bld 69 (*) 70 - 99 (mg/dL)    BUN 25 (*) 6 - 23 (mg/dL)    Creatinine, Ser 9.14  0.50 - 1.10 (mg/dL)    Calcium 9.6  8.4 - 10.5 (mg/dL)    GFR calc non Af Amer 58 (*) >90 (mL/min)    GFR calc Af Amer 68 (*) >90 (mL/min)   URINALYSIS, ROUTINE W REFLEX MICROSCOPIC     Status: Abnormal   Collection Time   10/26/11  2:21 PM      Component Value Range Comment   Color, Urine YELLOW  YELLOW     APPearance CLEAR  CLEAR     Specific Gravity, Urine 1.014  1.005 - 1.030     pH 6.0  5.0 - 8.0     Glucose, UA NEGATIVE  NEGATIVE (mg/dL)    Hgb urine dipstick TRACE (*) NEGATIVE     Bilirubin Urine NEGATIVE  NEGATIVE     Ketones, ur NEGATIVE  NEGATIVE (mg/dL)    Protein, ur 30 (*) NEGATIVE (mg/dL)    Urobilinogen, UA 0.2  0.0 - 1.0 (mg/dL)    Nitrite NEGATIVE  NEGATIVE     Leukocytes, UA SMALL (*) NEGATIVE    URINE MICROSCOPIC-ADD ON     Status: Abnormal   Collection Time   10/26/11  2:21 PM      Component Value Range Comment   Squamous Epithelial / LPF FEW (*) RARE     WBC, UA 3-6  <3 (WBC/hpf)    RBC / HPF 0-2  <3 (RBC/hpf)    Bacteria, UA FEW (*) RARE    GLUCOSE, CAPILLARY     Status: Abnormal   Collection Time   10/26/11  4:03 PM  Component Value Range Comment   Glucose-Capillary 31 (*) 70 - 99 (mg/dL)    Comment 1 Notify RN     GLUCOSE, CAPILLARY     Status: Abnormal   Collection Time   10/26/11  4:44 PM      Component Value Range Comment   Glucose-Capillary 173 (*) 70 - 99 (mg/dL)   GLUCOSE, CAPILLARY     Status: Abnormal   Collection Time   10/26/11  5:22 PM      Component Value Range Comment   Glucose-Capillary 142 (*) 70 - 99 (mg/dL)     CBC     Status: Abnormal   Collection Time   10/26/11  7:01 PM      Component Value Range Comment   WBC 5.5  4.0 - 10.5 (K/uL)    RBC 3.60 (*) 3.87 - 5.11 (MIL/uL)    Hemoglobin 10.9 (*) 12.0 - 15.0 (g/dL)    HCT 53.6 (*) 64.4 - 46.0 (%)    MCV 92.2  78.0 - 100.0 (fL)    MCH 30.3  26.0 - 34.0 (pg)    MCHC 32.8  30.0 - 36.0 (g/dL)    RDW 03.4  74.2 - 59.5 (%)    Platelets 214  150 - 400 (K/uL)   CREATININE, SERUM     Status: Abnormal   Collection Time   10/26/11  7:01 PM      Component Value Range Comment   Creatinine, Ser 1.03  0.50 - 1.10 (mg/dL)    GFR calc non Af Amer 59 (*) >90 (mL/min)    GFR calc Af Amer 69 (*) >90 (mL/min)   HEMOGLOBIN A1C     Status: Abnormal   Collection Time   10/26/11  7:01 PM      Component Value Range Comment   Hemoglobin A1C 8.0 (*) <5.7 (%)    Mean Plasma Glucose 183 (*) <117 (mg/dL)   LIPID PANEL     Status: Abnormal   Collection Time   10/26/11  7:01 PM      Component Value Range Comment   Cholesterol 205 (*) 0 - 200 (mg/dL)    Triglycerides 61  <638 (mg/dL)    HDL 82  >75 (mg/dL)    Total CHOL/HDL Ratio 2.5      VLDL 12  0 - 40 (mg/dL)    LDL Cholesterol 643 (*) 0 - 99 (mg/dL)   GLUCOSE, CAPILLARY     Status: Abnormal   Collection Time   10/26/11  7:57 PM      Component Value Range Comment   Glucose-Capillary 100 (*) 70 - 99 (mg/dL)   BASIC METABOLIC PANEL     Status: Abnormal   Collection Time   10/26/11 11:49 PM      Component Value Range Comment   Sodium 136  135 - 145 (mEq/L)    Potassium 4.3  3.5 - 5.1 (mEq/L) DELTA CHECK NOTED   Chloride 106  96 - 112 (mEq/L)    CO2 22  19 - 32 (mEq/L)    Glucose, Bld 121 (*) 70 - 99 (mg/dL)    BUN 20  6 - 23 (mg/dL)    Creatinine, Ser 3.29  0.50 - 1.10 (mg/dL)    Calcium 9.0  8.4 - 10.5 (mg/dL)    GFR calc non Af Amer 65 (*) >90 (mL/min)    GFR calc Af Amer 76 (*) >90 (mL/min)   GLUCOSE, CAPILLARY     Status: Abnormal   Collection Time   10/27/11  12:27 AM      Component Value Range Comment    Glucose-Capillary 109 (*) 70 - 99 (mg/dL)   CBC     Status: Abnormal   Collection Time   10/27/11  5:20 AM      Component Value Range Comment   WBC 5.5  4.0 - 10.5 (K/uL)    RBC 3.72 (*) 3.87 - 5.11 (MIL/uL)    Hemoglobin 11.3 (*) 12.0 - 15.0 (g/dL)    HCT 16.1 (*) 09.6 - 46.0 (%)    MCV 93.5  78.0 - 100.0 (fL)    MCH 30.4  26.0 - 34.0 (pg)    MCHC 32.5  30.0 - 36.0 (g/dL)    RDW 04.5  40.9 - 81.1 (%)    Platelets 200  150 - 400 (K/uL)   BASIC METABOLIC PANEL     Status: Abnormal   Collection Time   10/27/11  5:20 AM      Component Value Range Comment   Sodium 143  135 - 145 (mEq/L)    Potassium 4.3  3.5 - 5.1 (mEq/L)    Chloride 108  96 - 112 (mEq/L)    CO2 24  19 - 32 (mEq/L)    Glucose, Bld 101 (*) 70 - 99 (mg/dL)    BUN 18  6 - 23 (mg/dL)    Creatinine, Ser 9.14  0.50 - 1.10 (mg/dL)    Calcium 9.1  8.4 - 10.5 (mg/dL)    GFR calc non Af Amer 63 (*) >90 (mL/min)    GFR calc Af Amer 73 (*) >90 (mL/min)   GLUCOSE, CAPILLARY     Status: Abnormal   Collection Time   10/27/11  6:32 AM      Component Value Range Comment   Glucose-Capillary 110 (*) 70 - 99 (mg/dL)   GLUCOSE, CAPILLARY     Status: Abnormal   Collection Time   10/27/11  8:22 AM      Component Value Range Comment   Glucose-Capillary 149 (*) 70 - 99 (mg/dL)   GLUCOSE, CAPILLARY     Status: Abnormal   Collection Time   10/27/11 11:35 AM      Component Value Range Comment   Glucose-Capillary 148 (*) 70 - 99 (mg/dL)     Ct Head Wo Contrast  10/26/2011  *RADIOLOGY REPORT*  Clinical Data: Weakness.  Rule out CVA  CT HEAD WITHOUT CONTRAST  Technique:  Contiguous axial images were obtained from the base of the skull through the vertex without contrast.  Comparison: None  Findings: Mild to moderate atrophy for age.  Chronic microvascular ischemic change in the white matter bilaterally.  No acute infarct. Negative for hemorrhage or mass.  Calvarium is intact.  IMPRESSION: Atrophy and chronic microvascular ischemia.  No  acute abnormality.  Original Report Authenticated By: Camelia Phenes, M.D.     Disposition: home  Diet: diabetic/cardiac  Activity: as tolerated  Follow-up Appts: PCP in 2-3 days for BS check  Discharge Orders    Future Orders Please Complete By Expires   Diet - low sodium heart healthy      Diet Carb Modified      Increase activity slowly      Discharge instructions      Comments:   Check blood sugar at home QID and bring to PCP Hold lantus and amaryl until seen by PCP Out patient referral to diabetic teaching DO NOT SKIP MEALS!!!       Time spent on discharge, talking to the  patient, and coordinating care: 35 mins.   SignedMarlin Canary, DO 10/27/2011, 12:18 PM

## 2011-10-27 NOTE — Progress Notes (Signed)
Subjective: Patient feeling better, eating breakfast  Objective: Vital signs in last 24 hours: Filed Vitals:   10/26/11 1300 10/26/11 1730 10/26/11 2200 10/27/11 0535  BP: 171/90 166/75 157/81 124/66  Pulse: 76 82 85 76  Temp: 97.4 F (36.3 C) 98.6 F (37 C) 97.9 F (36.6 C) 98.1 F (36.7 C)  TempSrc: Oral Oral    Resp: 15 20 16 16   SpO2: 99% 100% 99% 99%   Weight change:   Intake/Output Summary (Last 24 hours) at 10/27/11 0905 Last data filed at 10/27/11 0500  Gross per 24 hour  Intake    720 ml  Output      0 ml  Net    720 ml    Physical Exam: General: Awake, Oriented, No acute distress. HEENT: EOMI. Neck: Supple CV: S1 and S2, RRR, no murmur Lungs: Clear to ascultation bilaterally, no wheezing Abdomen: Soft, Nontender, Nondistended, +bowel sounds. Ext: Good pulses. Trace edema.   Lab Results:  [facility] 10/27/11 0520 10/26/11 2349  NA 143 136  K 4.3 4.3  CL 108 106  CO2 24 22  GLUCOSE 101* 121*  BUN 18 20  CREATININE 0.98 0.95  CALCIUM 9.1 9.0  MG -- --  PHOS -- --      Basename 10/27/11 0520 10/26/11 1901 10/26/11 1405  WBC 5.5 5.5 --  NEUTROABS -- -- 3.9  HGB 11.3* 10.9* --  HCT 34.8* 33.2* --  MCV 93.5 92.2 --  PLT 200 214 --       Basename 10/26/11 1901  HGBA1C 8.0*    Basename 10/26/11 1901  CHOL 205*  HDL 82  LDLCALC 111*  TRIG 61  CHOLHDL 2.5  LDLDIRECT --      Micro Results: No results found for this or any previous visit (from the past 240 hour(s)).  Studies/Results: Ct Head Wo Contrast  10/26/2011  *RADIOLOGY REPORT*  Clinical Data: Weakness.  Rule out CVA  CT HEAD WITHOUT CONTRAST  Technique:  Contiguous axial images were obtained from the base of the skull through the vertex without contrast.  Comparison: None  Findings: Mild to moderate atrophy for age.  Chronic microvascular ischemic change in the white matter bilaterally.  No acute infarct. Negative for hemorrhage or mass.  Calvarium is intact.  IMPRESSION:  Atrophy and chronic microvascular ischemia.  No acute abnormality.  Original Report Authenticated By: Camelia Phenes, M.D.    Medications: I have reviewed the patient's current medications. Scheduled Meds:   . aspirin EC  81 mg Oral Daily  . dextrose      . dextrose      . enoxaparin  40 mg Subcutaneous Q24H  . nebivolol  5 mg Oral Daily  . sodium polystyrene  30 g Oral Once  . DISCONTD: dextrose 5 % and 0.45 % NaCl with KCl 20 mEq/L   Intravenous Once  . DISCONTD: dextrose 5 % and 0.45% NaCl   Intravenous STAT  . DISCONTD: dextrose  25 g Intravenous Once   Continuous Infusions:   . DISCONTD: dextrose 5 % and 0.45% NaCl    . DISCONTD: dextrose 5 % and 0.9% NaCl 60 mL/hr at 10/27/11 0812   PRN Meds:.acetaminophen, acetaminophen, albuterol, hydrALAZINE, HYDROcodone-acetaminophen, ondansetron (ZOFRAN) IV, ondansetron (ZOFRAN) IV, ondansetron  Assessment/Plan:   Hypoglycemia- patient was not eating meals regularly but still taking her diabetic medications, will stop d5 and watch BS this afternoon, if BS stable will plan to discharge   DM (diabetes mellitus)- will need outpatient referral to diabetic education,  per HG A1C poorly controlled   HTN (hypertension)- stable   Hyperkalemia- resolved with K exelate  Plan to D/C today if BS remains stable with outpatient referral to diabetic education     LOS: 1 day  Gina Nicastro, DO 10/27/2011, 9:05 AM

## 2012-08-01 ENCOUNTER — Emergency Department (HOSPITAL_COMMUNITY)
Admission: EM | Admit: 2012-08-01 | Discharge: 2012-08-02 | Disposition: A | Discharge status: Home / Self Care | Payer: PRIVATE HEALTH INSURANCE | Attending: Emergency Medicine | Admitting: Emergency Medicine

## 2012-08-01 ENCOUNTER — Encounter (HOSPITAL_COMMUNITY): Payer: Self-pay | Admitting: Emergency Medicine

## 2012-08-01 ENCOUNTER — Emergency Department (HOSPITAL_COMMUNITY): Payer: PRIVATE HEALTH INSURANCE

## 2012-08-01 ENCOUNTER — Other Ambulatory Visit: Payer: Self-pay

## 2012-08-01 DIAGNOSIS — E785 Hyperlipidemia, unspecified: Secondary | ICD-10-CM | POA: Insufficient documentation

## 2012-08-01 DIAGNOSIS — F1410 Cocaine abuse, uncomplicated: Secondary | ICD-10-CM | POA: Insufficient documentation

## 2012-08-01 DIAGNOSIS — E162 Hypoglycemia, unspecified: Secondary | ICD-10-CM

## 2012-08-01 DIAGNOSIS — I10 Essential (primary) hypertension: Secondary | ICD-10-CM | POA: Insufficient documentation

## 2012-08-01 DIAGNOSIS — Z794 Long term (current) use of insulin: Secondary | ICD-10-CM | POA: Insufficient documentation

## 2012-08-01 DIAGNOSIS — E875 Hyperkalemia: Secondary | ICD-10-CM | POA: Insufficient documentation

## 2012-08-01 DIAGNOSIS — Z79899 Other long term (current) drug therapy: Secondary | ICD-10-CM | POA: Insufficient documentation

## 2012-08-01 DIAGNOSIS — E1169 Type 2 diabetes mellitus with other specified complication: Secondary | ICD-10-CM | POA: Insufficient documentation

## 2012-08-01 DIAGNOSIS — F17200 Nicotine dependence, unspecified, uncomplicated: Secondary | ICD-10-CM | POA: Insufficient documentation

## 2012-08-01 LAB — CBC WITH DIFFERENTIAL/PLATELET
Basophils Relative: 0 % (ref 0–1)
Eosinophils Absolute: 0 10*3/uL (ref 0.0–0.7)
HCT: 29.8 % — ABNORMAL LOW (ref 36.0–46.0)
Hemoglobin: 9.8 g/dL — ABNORMAL LOW (ref 12.0–15.0)
Lymphs Abs: 1.1 10*3/uL (ref 0.7–4.0)
MCH: 30.2 pg (ref 26.0–34.0)
MCHC: 32.9 g/dL (ref 30.0–36.0)
Monocytes Absolute: 0.2 10*3/uL (ref 0.1–1.0)
Monocytes Relative: 4 % (ref 3–12)
Neutro Abs: 3.2 10*3/uL (ref 1.7–7.7)

## 2012-08-01 LAB — URINALYSIS, ROUTINE W REFLEX MICROSCOPIC
Ketones, ur: NEGATIVE mg/dL
Leukocytes, UA: NEGATIVE
Nitrite: NEGATIVE
Protein, ur: NEGATIVE mg/dL
Urobilinogen, UA: 0.2 mg/dL (ref 0.0–1.0)
pH: 5 (ref 5.0–8.0)

## 2012-08-01 LAB — COMPREHENSIVE METABOLIC PANEL
Albumin: 3.2 g/dL — ABNORMAL LOW (ref 3.5–5.2)
BUN: 26 mg/dL — ABNORMAL HIGH (ref 6–23)
Chloride: 108 mEq/L (ref 96–112)
Creatinine, Ser: 1.36 mg/dL — ABNORMAL HIGH (ref 0.50–1.10)
GFR calc Af Amer: 49 mL/min — ABNORMAL LOW (ref >90)
Glucose, Bld: 81 mg/dL (ref 70–99)
Total Bilirubin: 0.2 mg/dL — ABNORMAL LOW (ref 0.3–1.2)

## 2012-08-01 LAB — GLUCOSE, CAPILLARY
Glucose-Capillary: 100 mg/dL — ABNORMAL HIGH (ref 70–99)
Glucose-Capillary: 137 mg/dL — ABNORMAL HIGH (ref 70–99)
Glucose-Capillary: 165 mg/dL — ABNORMAL HIGH (ref 70–99)
Glucose-Capillary: 212 mg/dL — ABNORMAL HIGH (ref 70–99)

## 2012-08-01 LAB — RAPID URINE DRUG SCREEN, HOSP PERFORMED
Amphetamines: NOT DETECTED
Barbiturates: NOT DETECTED
Benzodiazepines: NOT DETECTED
Cocaine: POSITIVE — AB
Tetrahydrocannabinol: NOT DETECTED

## 2012-08-01 LAB — ACETAMINOPHEN LEVEL: Acetaminophen (Tylenol), Serum: <15 ug/mL (ref 10–30)

## 2012-08-01 LAB — LIPASE, BLOOD: Lipase: 17 U/L (ref 11–59)

## 2012-08-01 MED ORDER — SODIUM CHLORIDE 0.9 % IV BOLUS (SEPSIS)
1000.0000 mL | Freq: Once | INTRAVENOUS | Status: AC
  Administered 2012-08-01 (×2): 500 mL via INTRAVENOUS

## 2012-08-01 MED ORDER — SODIUM POLYSTYRENE SULFONATE 15 GM/60ML PO SUSP
30.0000 g | Freq: Once | ORAL | Status: AC
  Administered 2012-08-01: 30 g via ORAL
  Filled 2012-08-01 (×2): qty 60

## 2012-08-01 MED ORDER — DEXTROSE-NACL 5-0.45 % IV SOLN
INTRAVENOUS | Status: DC
  Administered 2012-08-01: 20:00:00 via INTRAVENOUS

## 2012-08-01 NOTE — ED Notes (Signed)
Pt alert and oriented x 3. Does not recall event(s) that took place at home. States she has been under a lot of stress and worries a lot.  Skin is cool to touch.

## 2012-08-01 NOTE — ED Notes (Signed)
ZOX:[id number]<AV> Expected date:08/01/12<BR> Expected time: 6:27 PM<BR> Means of arrival:<BR> Comments:<BR> hypoglycemia

## 2012-08-01 NOTE — ED Provider Notes (Signed)
History     CSN: [id number]  Arrival date & time 08/01/12  4098   First MD Initiated Contact with Patient 08/01/12 1844      Chief Complaint  Patient presents with  . Hypoglycemia    (Consider location/radiation/quality/duration/timing/severity/associated sxs/prior treatment) HPI Comments: Patient presents with decreased mental status for the past couple days according to her family. IMS arrival the family said she was eating or drinking or taking her medications over the past few days. She is not have a blood sugar 25 which came up to 109 with dextrose. Patient is awake and alert and oriented x2. She denies any pain or recent illness. No chest pain, abdominal pain, shortness of breath. No reported fevers at home be hypothermic on arrival.  The history is provided by the patient.    Past Medical History  Diagnosis Date  . Diabetes mellitus   . Hypertension   . Hyperlipemia     History reviewed. No pertinent past surgical history.  No family history on file.  History  Substance Use Topics  . Smoking status: Current Every Day Smoker  . Smokeless tobacco: Not on file  . Alcohol Use: Yes     Comment: occasional;     OB History    Grav Para Term Preterm Abortions TAB SAB Ect Mult Living                  Review of Systems  Unable to perform ROS: Mental status change    Allergies  Review of patient's allergies indicates no known allergies.  Home Medications   Current Outpatient Rx  Name  Route  Sig  Dispense  Refill  . IBUPROFEN 600 MG PO TABS   Oral   Take 600 mg by mouth every 6 (six) hours as needed. Swelling, pain          . INSULIN GLARGINE 100 UNIT/ML [place] SOLN   Subcutaneous   Inject 40 Units into the skin at bedtime.         Marland Kitchen NAPROXEN 500 MG PO TABS   Oral   Take 500 mg by mouth 2 (two) times daily as needed. For pain           . NEBIVOLOL HCL 5 MG PO TABS   Oral   Take 5 mg by mouth daily.         Marland Kitchen PIOGLITAZONE HCL-METFORMIN HCL  15-500 MG PO TABS   Oral   Take 1 tablet by mouth 2 (two) times daily with a meal.             BP 176/90  Pulse 85  Temp 98.4 F (36.9 C) (Oral)  Resp 21  SpO2 98%  Physical Exam  Constitutional: She appears well-developed and well-nourished. No distress.  HENT:  Head: Normocephalic and atraumatic.  Mouth/Throat: Oropharynx is clear and moist. No oropharyngeal exudate.  Eyes: Conjunctivae normal and EOM are normal. Pupils are equal, round, and reactive to light.  Neck: Normal range of motion. Neck supple.       No meningismus  Cardiovascular: Normal rate, regular rhythm and normal heart sounds.   No murmur heard. Pulmonary/Chest: Effort normal and breath sounds normal. No respiratory distress.  Abdominal: Soft. There is no tenderness. There is no rebound and no guarding.  Musculoskeletal: Normal range of motion. She exhibits no edema and no tenderness.  Neurological: She is alert. No cranial nerve deficit. She exhibits normal muscle tone.       Moves all extremities, follows  commands, no appreciable deficits  Skin: Skin is warm.    ED Course  Procedures (including critical care time)  Labs Reviewed  CBC WITH DIFFERENTIAL - Abnormal; Notable for the following:    RBC 3.25 (*)     Hemoglobin 9.8 (*)     HCT 29.8 (*)     All other components within normal limits  COMPREHENSIVE METABOLIC PANEL - Abnormal; Notable for the following:    Potassium 5.6 (*)     BUN 26 (*)     Creatinine, Ser 1.36 (*)     Albumin 3.2 (*)     Total Bilirubin 0.2 (*)     GFR calc non Af Amer 42 (*)     GFR calc Af Amer 49 (*)     All other components within normal limits  URINALYSIS, ROUTINE W REFLEX MICROSCOPIC - Abnormal; Notable for the following:    APPearance CLOUDY (*)     All other components within normal limits  ETHANOL - Abnormal; Notable for the following:    Alcohol, Ethyl (B) 77 (*)     All other components within normal limits  SALICYLATE LEVEL - Abnormal; Notable for the  following:    Salicylate Lvl <2.0 (*)     All other components within normal limits  URINE RAPID DRUG SCREEN (HOSP PERFORMED) - Abnormal; Notable for the following:    Cocaine POSITIVE (*)     All other components within normal limits  GLUCOSE, CAPILLARY - Abnormal; Notable for the following:    Glucose-Capillary 100 (*)     All other components within normal limits  GLUCOSE, CAPILLARY - Abnormal; Notable for the following:    Glucose-Capillary 161 (*)     All other components within normal limits  GLUCOSE, CAPILLARY - Abnormal; Notable for the following:    Glucose-Capillary 212 (*)     All other components within normal limits  LIPASE, BLOOD  LACTIC ACID, PLASMA  ACETAMINOPHEN LEVEL  GLUCOSE, CAPILLARY  GLUCOSE, CAPILLARY  CULTURE, BLOOD (ROUTINE X 2)  CULTURE, BLOOD (ROUTINE X 2)   Dg Chest 2 View  08/01/2012  *RADIOLOGY REPORT*  Clinical Data: Altered mental status, hypertension, diabetes  CHEST - 2 VIEW  Comparison: None  Findings: Normal heart size. Mediastinal contours and pulmonary vascularity normal. Minimal peribronchial thickening. No pulmonary infiltrate, pleural effusion or pneumothorax. Old appearing anterior height loss of a lower thoracic vertebra on lateral view.  IMPRESSION: Minimal bronchitic changes.   Original Report Authenticated By: Ulyses Southward, M.D.    Ct Head Wo Contrast  08/01/2012  *RADIOLOGY REPORT*  Clinical Data: Larey Seat out of bed, history diabetes, hypertension  CT HEAD WITHOUT CONTRAST  Technique:  Contiguous axial images were obtained from the base of the skull through the vertex without contrast.  Comparison: 10/26/2011  Findings: Generalized atrophy. Normal ventricular morphology. No midline shift or mass effect. Small vessel chronic ischemic changes of deep cerebral white matter. No intracranial hemorrhage, mass lesion, or acute infarction. Small amount fluid within the sphenoid sinus. Visualized paranasal sinuses and mastoid air cells otherwise clear.  Bones unremarkable. Atherosclerotic calcification of internal carotid arteries at skull base.  IMPRESSION: Atrophy with small vessel chronic ischemic changes of deep cerebral white matter. No acute intracranial abnormalities.   Original Report Authenticated By: Ulyses Southward, M.D.      No diagnosis found.    MDM  Altered mental status without evidence of trauma. No history of fevers. Found to have hypoglycemia and hypothermia.  Head CT negative. Lab work remarkable  for mild renal insufficiency, cocaine in urine. Mild hyperkalemia without EKG changes. UA negative.CXR clear.  Temperature improved with warm blankets and fluids. No evidence of infection and workup.. Patient's mentation has improved she is alert and oriented x3. She denies any pain. She has no focal neurological deficits. She is at her baseline mentation per family. Her sugars have been consistently elevated above 100. She is tolerating by mouth and ambulatory in the ED..   Given kayexalate for mild hyperkalemia without EKG changes. Repeat chemistry pending at time of sign out to Dr. Silverio Lay, pending improvement in K and Cr.   Date: 08/01/2012  Rate: 71  Rhythm: normal sinus rhythm  QRS Axis: normal  Intervals: normal  ST/T Wave abnormalities: normal  Conduction Disutrbances:none  Narrative Interpretation:   Old EKG Reviewed: unchanged      Glynn Octave, MD 08/02/12 1133

## 2012-08-01 NOTE — ED Notes (Signed)
CBG 72  

## 2012-08-01 NOTE — ED Notes (Signed)
Pt ambulated well with standby assistance, denies dizziness.Gait was WNL. Tolerating fluid challenge with water, asking to eat sandwich.

## 2012-08-01 NOTE — ED Notes (Signed)
Per EMS pt's family states "pt hasn't been herself past couple of days", not eating and taking her meds.  They thought she was just sleeping and when she fell out of bed they realized something was wrong. Per EMS blood sugar was 25 when they arrived on scene after given her 25gm Dextrose her sugar came up to 109. Pt lethargic, confused at times.

## 2012-08-02 LAB — BASIC METABOLIC PANEL
CO2: 21 mEq/L (ref 19–32)
Chloride: 107 mEq/L (ref 96–112)
GFR calc Af Amer: 54 mL/min — ABNORMAL LOW (ref >90)
Sodium: 139 mEq/L (ref 135–145)

## 2012-08-02 NOTE — ED Notes (Signed)
Pt left AMA. Reported that she was arguing with her boyfriend and left out the hospital.

## 2012-08-02 NOTE — ED Notes (Signed)
Pt left hospital AMA.

## 2012-08-02 NOTE — ED Provider Notes (Signed)
I assumed care at sign out. Gina Cox is a 56 y.o. female hx of DM here with hypoglycemia and feeling weak. She has been observed in the ED for the last 6 hours. She was tolerating PO. FS has been stable. She has mild hyperkalemia without EKG changes and was given kayexelate. Repeat BMP stable. Will d/c home with outpatient f/u.   Results for orders placed during the hospital encounter of 08/01/12  CBC WITH DIFFERENTIAL      Component Value Range   WBC 4.5  4.0 - 10.5 K/uL   RBC 3.25 (*) 3.87 - 5.11 MIL/uL   Hemoglobin 9.8 (*) 12.0 - 15.0 g/dL   HCT 16.1 (*) 09.6 - 04.5 %   MCV 91.7  78.0 - 100.0 fL   MCH 30.2  26.0 - 34.0 pg   MCHC 32.9  30.0 - 36.0 g/dL   RDW 40.9  81.1 - 91.4 %   Platelets 213  150 - 400 K/uL   Neutrophils Relative 71  43 - 77 %   Neutro Abs 3.2  1.7 - 7.7 K/uL   Lymphocytes Relative 25  12 - 46 %   Lymphs Abs 1.1  0.7 - 4.0 K/uL   Monocytes Relative 4  3 - 12 %   Monocytes Absolute 0.2  0.1 - 1.0 K/uL   Eosinophils Relative 0  0 - 5 %   Eosinophils Absolute 0.0  0.0 - 0.7 K/uL   Basophils Relative 0  0 - 1 %   Basophils Absolute 0.0  0.0 - 0.1 K/uL  COMPREHENSIVE METABOLIC PANEL      Component Value Range   Sodium 138  135 - 145 mEq/L   Potassium 5.6 (*) 3.5 - 5.1 mEq/L   Chloride 108  96 - 112 mEq/L   CO2 20  19 - 32 mEq/L   Glucose, Bld 81  70 - 99 mg/dL   BUN 26 (*) 6 - 23 mg/dL   Creatinine, Ser 7.82 (*) 0.50 - 1.10 mg/dL   Calcium 8.8  8.4 - 95.6 mg/dL   Total Protein 7.0  6.0 - 8.3 g/dL   Albumin 3.2 (*) 3.5 - 5.2 g/dL   AST 18  0 - 37 U/L   ALT 9  0 - 35 U/L   Alkaline Phosphatase 57  39 - 117 U/L   Total Bilirubin 0.2 (*) 0.3 - 1.2 mg/dL   GFR calc non Af Amer 42 (*) >90 mL/min   GFR calc Af Amer 49 (*) >90 mL/min  LIPASE, BLOOD      Component Value Range   Lipase 17  11 - 59 U/L  URINALYSIS, ROUTINE W REFLEX MICROSCOPIC      Component Value Range   Color, Urine YELLOW  YELLOW   APPearance CLOUDY (*) CLEAR   Specific Gravity, Urine  1.010  1.005 - 1.030   pH 5.0  5.0 - 8.0   Glucose, UA NEGATIVE  NEGATIVE mg/dL   Hgb urine dipstick NEGATIVE  NEGATIVE   Bilirubin Urine NEGATIVE  NEGATIVE   Ketones, ur NEGATIVE  NEGATIVE mg/dL   Protein, ur NEGATIVE  NEGATIVE mg/dL   Urobilinogen, UA 0.2  0.0 - 1.0 mg/dL   Nitrite NEGATIVE  NEGATIVE   Leukocytes, UA NEGATIVE  NEGATIVE  LACTIC ACID, PLASMA      Component Value Range   Lactic Acid, Venous 1.7  0.5 - 2.2 mmol/L  ETHANOL      Component Value Range   Alcohol, Ethyl (B) 77 (*)  0 - 11 mg/dL  ACETAMINOPHEN LEVEL      Component Value Range   Acetaminophen (Tylenol), Serum <15.0  10 - 30 ug/mL  SALICYLATE LEVEL      Component Value Range   Salicylate Lvl <2.0 (*) 2.8 - 20.0 mg/dL  URINE RAPID DRUG SCREEN (HOSP PERFORMED)      Component Value Range   Opiates NONE DETECTED  NONE DETECTED   Cocaine POSITIVE (*) NONE DETECTED   Benzodiazepines NONE DETECTED  NONE DETECTED   Amphetamines NONE DETECTED  NONE DETECTED   Tetrahydrocannabinol NONE DETECTED  NONE DETECTED   Barbiturates NONE DETECTED  NONE DETECTED  GLUCOSE, CAPILLARY      Component Value Range   Glucose-Capillary 100 (*) 70 - 99 mg/dL   Comment 1 Documented in Chart     Comment 2 Notify RN    GLUCOSE, CAPILLARY      Component Value Range   Glucose-Capillary 80  70 - 99 mg/dL  GLUCOSE, CAPILLARY      Component Value Range   Glucose-Capillary 72  70 - 99 mg/dL  GLUCOSE, CAPILLARY      Component Value Range   Glucose-Capillary 161 (*) 70 - 99 mg/dL  GLUCOSE, CAPILLARY      Component Value Range   Glucose-Capillary 212 (*) 70 - 99 mg/dL   Comment 1 Notify RN    BASIC METABOLIC PANEL      Component Value Range   Sodium 139  135 - 145 mEq/L   Potassium 5.4 (*) 3.5 - 5.1 mEq/L   Chloride 107  96 - 112 mEq/L   CO2 21  19 - 32 mEq/L   Glucose, Bld 180 (*) 70 - 99 mg/dL   BUN 23  6 - 23 mg/dL   Creatinine, Ser 1.61 (*) 0.50 - 1.10 mg/dL   Calcium 9.2  8.4 - 09.6 mg/dL   GFR calc non Af Amer 46 (*) >90  mL/min   GFR calc Af Amer 54 (*) >90 mL/min  GLUCOSE, CAPILLARY      Component Value Range   Glucose-Capillary 165 (*) 70 - 99 mg/dL  GLUCOSE, CAPILLARY      Component Value Range   Glucose-Capillary 137 (*) 70 - 99 mg/dL   Comment 1 Documented in Chart     Comment 2 Notify RN     Dg Chest 2 View  08/01/2012  *RADIOLOGY REPORT*  Clinical Data: Altered mental status, hypertension, diabetes  CHEST - 2 VIEW  Comparison: None  Findings: Normal heart size. Mediastinal contours and pulmonary vascularity normal. Minimal peribronchial thickening. No pulmonary infiltrate, pleural effusion or pneumothorax. Old appearing anterior height loss of a lower thoracic vertebra on lateral view.  IMPRESSION: Minimal bronchitic changes.   Original Report Authenticated By: Ulyses Southward, M.D.    Ct Head Wo Contrast  08/01/2012  *RADIOLOGY REPORT*  Clinical Data: Larey Seat out of bed, history diabetes, hypertension  CT HEAD WITHOUT CONTRAST  Technique:  Contiguous axial images were obtained from the base of the skull through the vertex without contrast.  Comparison: 10/26/2011  Findings: Generalized atrophy. Normal ventricular morphology. No midline shift or mass effect. Small vessel chronic ischemic changes of deep cerebral white matter. No intracranial hemorrhage, mass lesion, or acute infarction. Small amount fluid within the sphenoid sinus. Visualized paranasal sinuses and mastoid air cells otherwise clear. Bones unremarkable. Atherosclerotic calcification of internal carotid arteries at skull base.  IMPRESSION: Atrophy with small vessel chronic ischemic changes of deep cerebral white matter. No acute intracranial abnormalities.  Original Report Authenticated By: Ulyses Southward, M.D.        Richardean Canal, MD 08/02/12 [phone]

## 2012-08-08 LAB — CULTURE, BLOOD (ROUTINE X 2)

## 2012-08-23 ENCOUNTER — Encounter (HOSPITAL_COMMUNITY): Payer: Self-pay | Admitting: Emergency Medicine

## 2012-08-23 ENCOUNTER — Emergency Department (HOSPITAL_COMMUNITY)
Admission: EM | Admit: 2012-08-23 | Discharge: 2012-08-23 | Disposition: A | Discharge status: Home / Self Care | Payer: PRIVATE HEALTH INSURANCE | Attending: Emergency Medicine | Admitting: Emergency Medicine

## 2012-08-23 ENCOUNTER — Emergency Department (HOSPITAL_COMMUNITY): Payer: PRIVATE HEALTH INSURANCE

## 2012-08-23 DIAGNOSIS — E1169 Type 2 diabetes mellitus with other specified complication: Secondary | ICD-10-CM | POA: Insufficient documentation

## 2012-08-23 DIAGNOSIS — I10 Essential (primary) hypertension: Secondary | ICD-10-CM | POA: Insufficient documentation

## 2012-08-23 DIAGNOSIS — F1210 Cannabis abuse, uncomplicated: Secondary | ICD-10-CM | POA: Insufficient documentation

## 2012-08-23 DIAGNOSIS — Z794 Long term (current) use of insulin: Secondary | ICD-10-CM | POA: Insufficient documentation

## 2012-08-23 DIAGNOSIS — F17200 Nicotine dependence, unspecified, uncomplicated: Secondary | ICD-10-CM | POA: Insufficient documentation

## 2012-08-23 DIAGNOSIS — F1410 Cocaine abuse, uncomplicated: Secondary | ICD-10-CM | POA: Insufficient documentation

## 2012-08-23 DIAGNOSIS — E162 Hypoglycemia, unspecified: Secondary | ICD-10-CM

## 2012-08-23 DIAGNOSIS — Z79899 Other long term (current) drug therapy: Secondary | ICD-10-CM | POA: Insufficient documentation

## 2012-08-23 DIAGNOSIS — E785 Hyperlipidemia, unspecified: Secondary | ICD-10-CM | POA: Insufficient documentation

## 2012-08-23 LAB — CBC WITH DIFFERENTIAL/PLATELET
Basophils Absolute: 0 10*3/uL (ref 0.0–0.1)
Basophils Relative: 0 % (ref 0–1)
Hemoglobin: 11.4 g/dL — ABNORMAL LOW (ref 12.0–15.0)
MCHC: 33.2 g/dL (ref 30.0–36.0)
Monocytes Relative: 7 % (ref 3–12)
Neutro Abs: 3.8 10*3/uL (ref 1.7–7.7)
Neutrophils Relative %: 74 % (ref 43–77)
WBC: 5.2 10*3/uL (ref 4.0–10.5)

## 2012-08-23 LAB — COMPREHENSIVE METABOLIC PANEL
AST: 20 U/L (ref 0–37)
Albumin: 3.5 g/dL (ref 3.5–5.2)
Alkaline Phosphatase: 75 U/L (ref 39–117)
BUN: 29 mg/dL — ABNORMAL HIGH (ref 6–23)
Chloride: 100 mEq/L (ref 96–112)
Potassium: 4 mEq/L (ref 3.5–5.1)
Total Bilirubin: 0.2 mg/dL — ABNORMAL LOW (ref 0.3–1.2)

## 2012-08-23 LAB — ETHANOL: Alcohol, Ethyl (B): <11 mg/dL (ref 0–11)

## 2012-08-23 LAB — URINALYSIS, ROUTINE W REFLEX MICROSCOPIC
Protein, ur: 30 mg/dL — AB
Urobilinogen, UA: 0.2 mg/dL (ref 0.0–1.0)

## 2012-08-23 LAB — URINE MICROSCOPIC-ADD ON

## 2012-08-23 LAB — RAPID URINE DRUG SCREEN, HOSP PERFORMED
Barbiturates: NOT DETECTED
Tetrahydrocannabinol: NOT DETECTED

## 2012-08-23 LAB — GLUCOSE, CAPILLARY: Glucose-Capillary: 165 mg/dL — ABNORMAL HIGH (ref 70–99)

## 2012-08-23 NOTE — ED Provider Notes (Signed)
History     CSN: [id number]  Arrival date & time 08/23/12  1519   First MD Initiated Contact with Patient 08/23/12 1549      Chief Complaint  Patient presents with  . Hypoglycemia    (Consider location/radiation/quality/duration/timing/severity/associated sxs/prior treatment) HPI 56 y.o. Female with niddm presenting with hypoglycemia.  Patient states she has not been eating like she is supposed to.   Patient states she isn't eating like she is supposed to because she is high on cocaine.  States she began using cocaine two years ago.  States she also smokes marijuana and drinks alcohol.  States she has drank alcohol all her life.  States last used cocaine Saturday.  Took medicine today but only ate a little bit.  Daughter told her she was repeating herself and rn states ems stated bs was 27 and given cranberry juice and d10.  Lives with daughter and grandson.  PMD  Primary care on [employer].   Past Medical History  Diagnosis Date  . Diabetes mellitus   . Hypertension   . Hyperlipemia     History reviewed. No pertinent past surgical history.  No family history on file.  History  Substance Use Topics  . Smoking status: Current Every Day Smoker -- 2.0 packs/day    Types: Cigarettes  . Smokeless tobacco: Not on file  . Alcohol Use: Yes     Comment: monthly    OB History    Grav Para Term Preterm Abortions TAB SAB Ect Mult Living                  Review of Systems  Allergies  Review of patient's allergies indicates no known allergies.  Home Medications   Current Outpatient Rx  Name  Route  Sig  Dispense  Refill  . IBUPROFEN 600 MG PO TABS   Oral   Take 600 mg by mouth every 6 (six) hours as needed. Swelling, pain          . INSULIN GLARGINE 100 UNIT/ML [place] SOLN   Subcutaneous   Inject 40 Units into the skin at bedtime.         Marland Kitchen NAPROXEN 500 MG PO TABS   Oral   Take 500 mg by mouth 2 (two) times daily as needed. For pain           . NEBIVOLOL HCL 5  MG PO TABS   Oral   Take 5 mg by mouth daily.         Marland Kitchen PIOGLITAZONE HCL-METFORMIN HCL 15-500 MG PO TABS   Oral   Take 1 tablet by mouth 2 (two) times daily with a meal.             BP 167/79  Pulse 79  Temp 97.1 F (36.2 C) (Oral)  Resp 18  SpO2 100%  Physical Exam  Nursing note and vitals reviewed. Constitutional: She is oriented to person, place, and time. She appears well-developed and well-nourished.  HENT:  Head: Normocephalic and atraumatic.  Right Ear: External ear normal.  Left Ear: External ear normal.  Nose: Nose normal.  Mouth/Throat: Oropharynx is clear and moist.  Eyes: Conjunctivae normal and EOM are normal. Pupils are equal, round, and reactive to light.  Neck: Normal range of motion. Neck supple.  Cardiovascular: Normal rate and regular rhythm.   Pulmonary/Chest: Effort normal and breath sounds normal.  Abdominal: Soft. Bowel sounds are normal.  Musculoskeletal: Normal range of motion.  Neurological: She is alert and oriented  to person, place, and time.  Skin: Skin is warm and dry.  Psychiatric: She has a normal mood and affect. Her behavior is normal.    ED Course  Procedures (including critical care time)  Labs Reviewed - No data to display No results found.   No diagnosis found.    MDM   Patient takes by mouth well here. Her blood sugars have remained 150-200. She apparently are advised regarding no cocaine use and ongoing oral sugar and frequent blood sugar checks.    Hilario Quarry, MD 08/23/12 [phone]

## 2012-08-23 NOTE — ED Notes (Signed)
Pt presenting to ed with c/o "my blood sugar dropped" pt states she hasn't had anything to eat because she wasn't hungry. Pt is alert and oriented at this time

## 2012-08-23 NOTE — ED Notes (Signed)
Pt. Was given a [place] sandwich and a diet sprite.  Nurse was notified.

## 2012-08-26 LAB — URINE CULTURE: Colony Count: >=100000

## 2012-08-27 NOTE — ED Notes (Signed)
+   urine Chart sent to EDP office for review. 

## 2012-09-04 ENCOUNTER — Telehealth (HOSPITAL_COMMUNITY): Payer: Self-pay | Admitting: Emergency Medicine

## 2012-09-05 NOTE — ED Notes (Signed)
Unable to contact patient via phone. Sent letter. °

## 2012-09-05 NOTE — ED Notes (Signed)
Chart returned from EDP office. Prescribed Bactrim DS 1 tab daily x 7 days. Prescribed by Roxy Horseman PA-C.

## 2012-10-24 ENCOUNTER — Telehealth (HOSPITAL_COMMUNITY): Payer: Self-pay | Admitting: Emergency Medicine

## 2012-10-24 NOTE — ED Notes (Signed)
No response to letter sent after 30 days. Chart sent to Medical Records. °

## 2014-07-08 IMAGING — CT CT HEAD W/O CM
2 series · 17 of 30 positions shown, 20 images · non-contrast
Comparison: 10/26/2011

CLINICAL DATA: Fell out of bed, history diabetes, hypertension

CT HEAD WITHOUT CONTRAST
TECHNIQUE: Contiguous axial images were obtained from the base of
the skull through the vertex without contrast.

[Series 2: head w/o · axial · non-contrast · 0.51mm/px · z∈[-142,-27]mm · 9 of 29 slices shown, 12 images]
[im 3/29  brain]
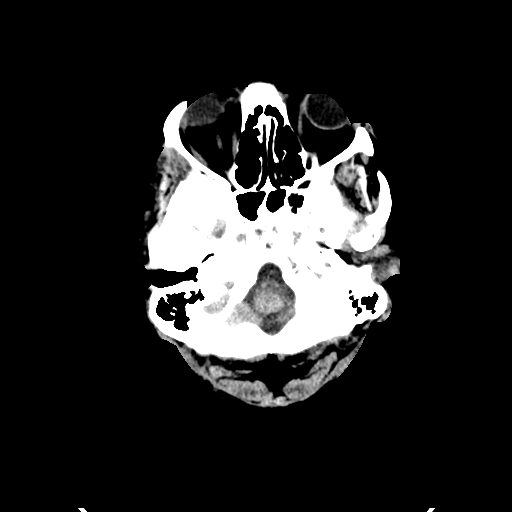
[im 3/29  bone]
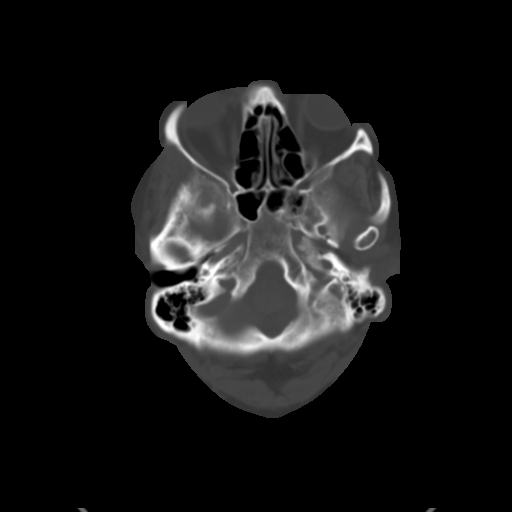
[im 6/29  brain]
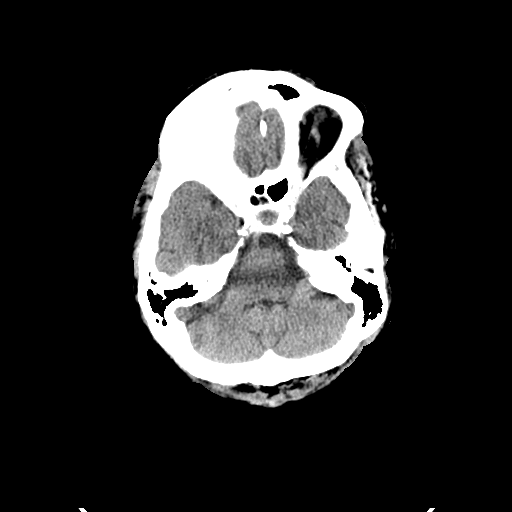
[im 9/29  brain]
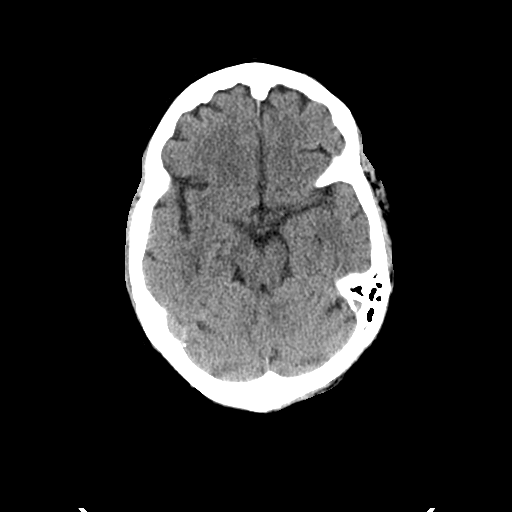
[im 12/29  brain]
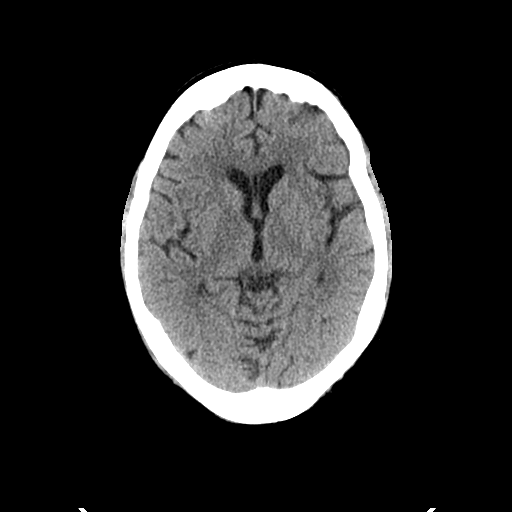
[im 15/29  brain]
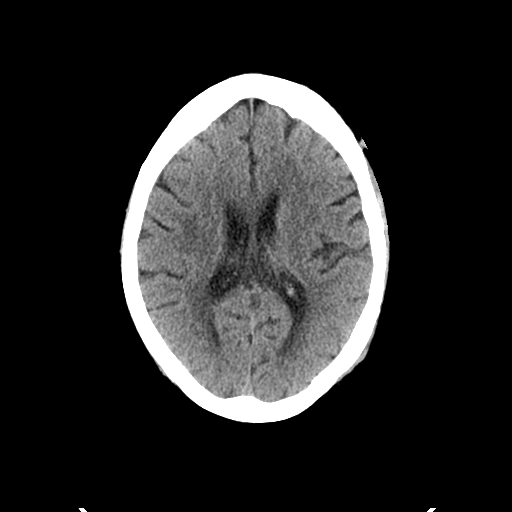
[im 15/29  bone]
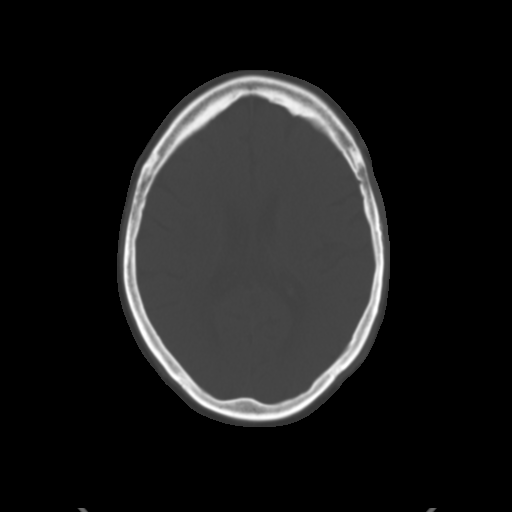
[im 17/29  brain]
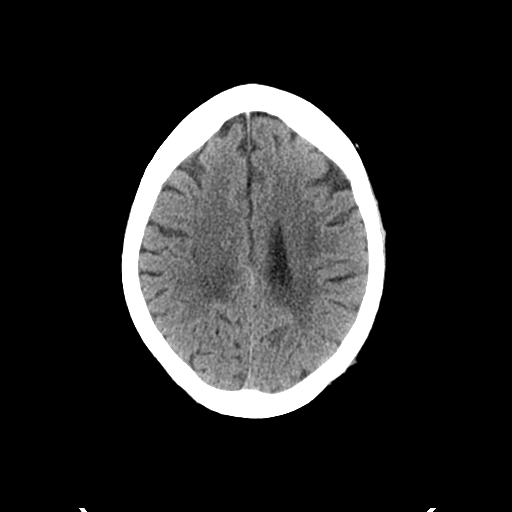
[im 20/29  brain]
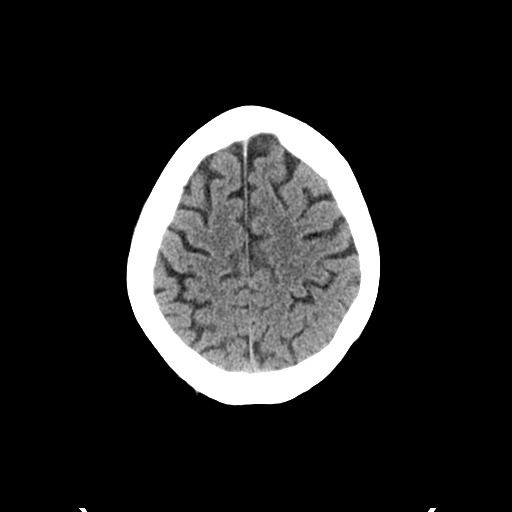
[im 23/29  brain]
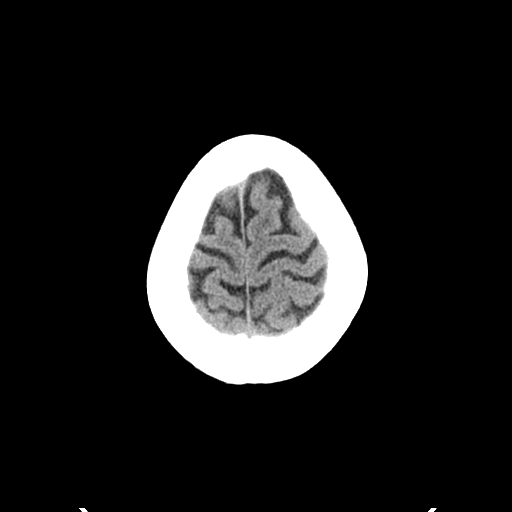
[im 26/29  brain]
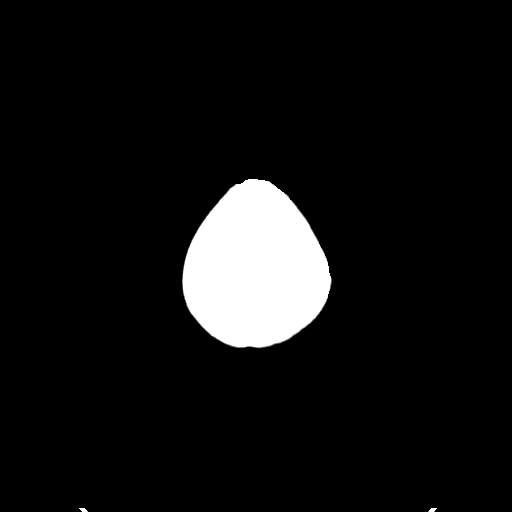
[im 26/29  bone]
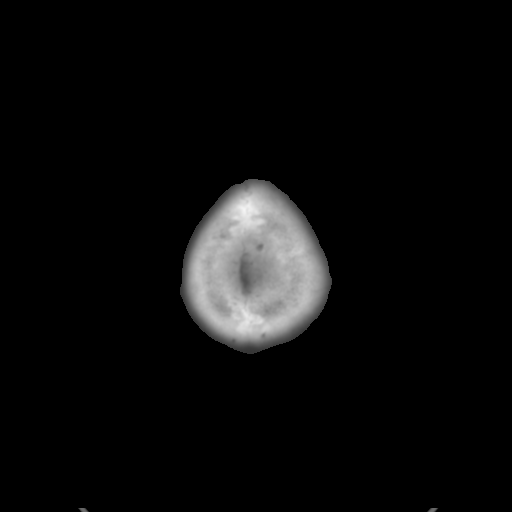

[Series 3: bone windows · axial · 0.51mm/px · z∈[-137,-29]mm · 8 of 48 slices shown]
[im 6/48  bone]
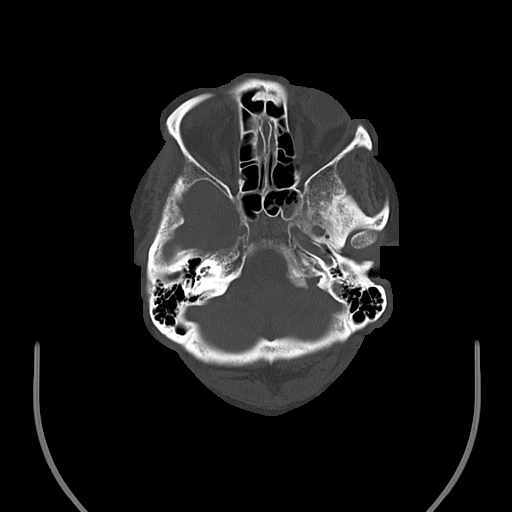
[im 11/48  bone]
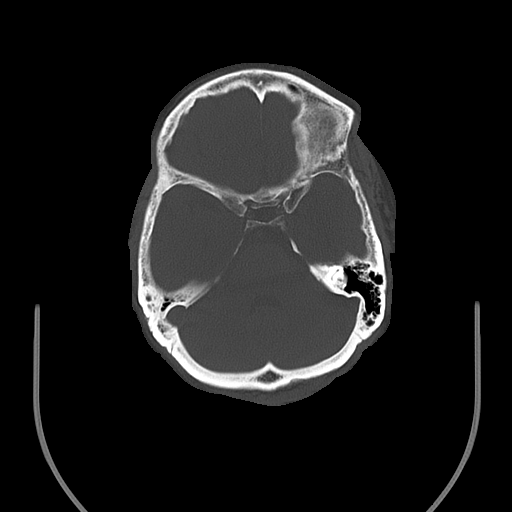
[im 16/48  bone]
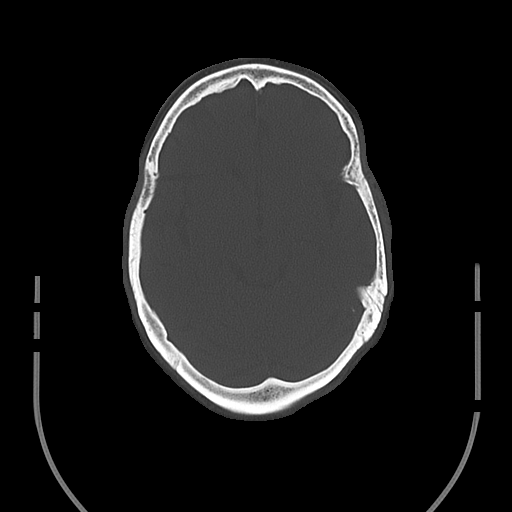
[im 21/48  bone]
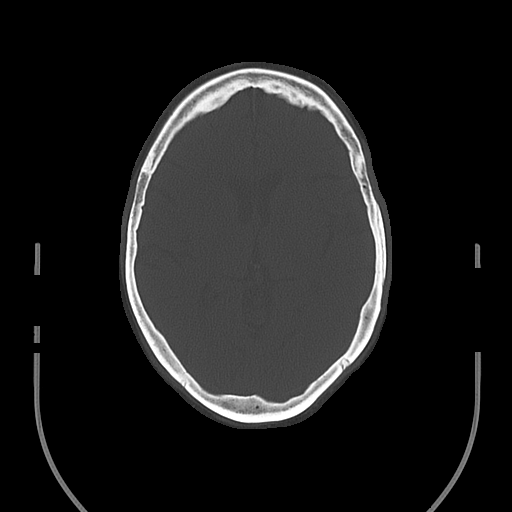
[im 27/48  bone]
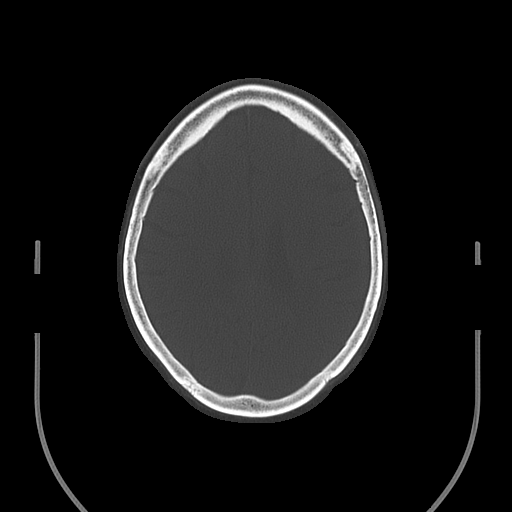
[im 32/48  bone]
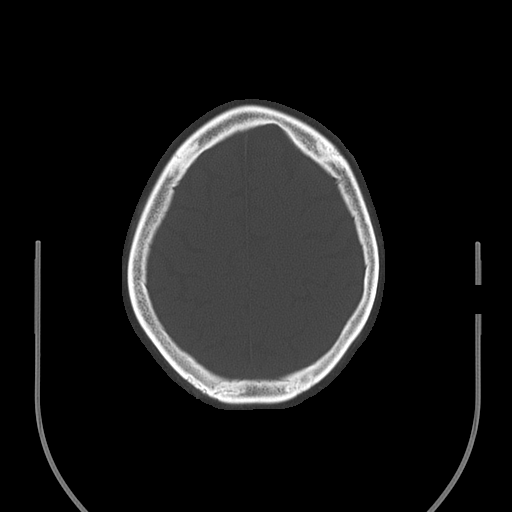
[im 37/48  bone]
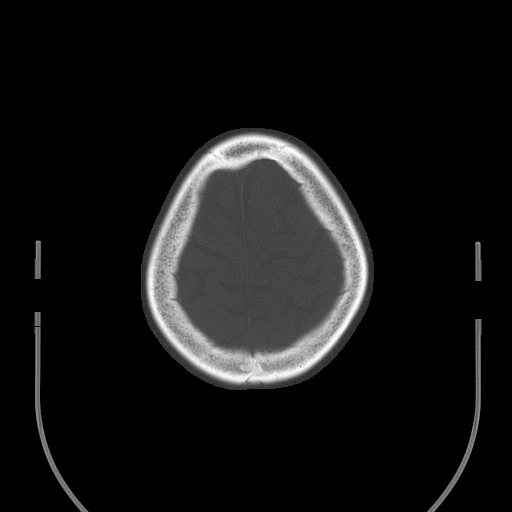
[im 42/48  bone]
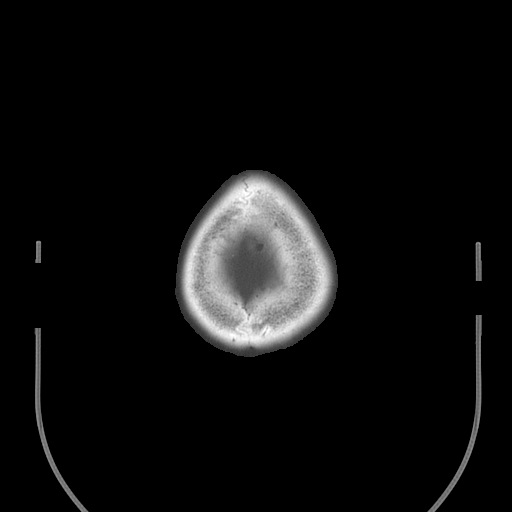

[17 of 30 positions shown; findings below may reference images not displayed]

FINDINGS: Generalized atrophy.
Normal ventricular morphology.
No midline shift or mass effect.
Small vessel chronic ischemic changes of deep cerebral white
matter.
No intracranial hemorrhage, mass lesion, or acute infarction.
Small amount fluid within the sphenoid sinus.
Visualized paranasal sinuses and mastoid air cells otherwise clear.
Bones unremarkable.
Atherosclerotic calcification of internal carotid arteries at skull
base.
IMPRESSION: Atrophy with small vessel chronic ischemic changes of deep cerebral
white matter.
No acute intracranial abnormalities.

## 2014-12-04 ENCOUNTER — Encounter (HOSPITAL_COMMUNITY): Payer: Self-pay | Admitting: Emergency Medicine

## 2014-12-04 ENCOUNTER — Inpatient Hospital Stay (HOSPITAL_COMMUNITY)
Admission: EM | Admit: 2014-12-04 | Discharge: 2014-12-27 | DRG: 004 | Disposition: A | Discharge status: Skilled Nursing Facility | Payer: Medicare Other | Attending: Internal Medicine | Admitting: Internal Medicine

## 2014-12-04 ENCOUNTER — Emergency Department (HOSPITAL_COMMUNITY): Payer: Medicare Other

## 2014-12-04 DIAGNOSIS — M109 Gout, unspecified: Secondary | ICD-10-CM | POA: Diagnosis present

## 2014-12-04 DIAGNOSIS — I129 Hypertensive chronic kidney disease with stage 1 through stage 4 chronic kidney disease, or unspecified chronic kidney disease: Secondary | ICD-10-CM | POA: Diagnosis present

## 2014-12-04 DIAGNOSIS — J9621 Acute and chronic respiratory failure with hypoxia: Secondary | ICD-10-CM | POA: Diagnosis present

## 2014-12-04 DIAGNOSIS — R1319 Other dysphagia: Secondary | ICD-10-CM | POA: Diagnosis present

## 2014-12-04 DIAGNOSIS — J9620 Acute and chronic respiratory failure, unspecified whether with hypoxia or hypercapnia: Secondary | ICD-10-CM | POA: Diagnosis not present

## 2014-12-04 DIAGNOSIS — E785 Hyperlipidemia, unspecified: Secondary | ICD-10-CM | POA: Diagnosis present

## 2014-12-04 DIAGNOSIS — Z6836 Body mass index (BMI) 36.0-36.9, adult: Secondary | ICD-10-CM | POA: Diagnosis not present

## 2014-12-04 DIAGNOSIS — D649 Anemia, unspecified: Secondary | ICD-10-CM | POA: Diagnosis present

## 2014-12-04 DIAGNOSIS — N189 Chronic kidney disease, unspecified: Secondary | ICD-10-CM | POA: Diagnosis present

## 2014-12-04 DIAGNOSIS — F17210 Nicotine dependence, cigarettes, uncomplicated: Secondary | ICD-10-CM | POA: Diagnosis present

## 2014-12-04 DIAGNOSIS — E1122 Type 2 diabetes mellitus with diabetic chronic kidney disease: Secondary | ICD-10-CM | POA: Diagnosis present

## 2014-12-04 DIAGNOSIS — G0490 Encephalitis and encephalomyelitis, unspecified: Secondary | ICD-10-CM | POA: Diagnosis present

## 2014-12-04 DIAGNOSIS — Y848 Other medical procedures as the cause of abnormal reaction of the patient, or of later complication, without mention of misadventure at the time of the procedure: Secondary | ICD-10-CM | POA: Diagnosis present

## 2014-12-04 DIAGNOSIS — E875 Hyperkalemia: Secondary | ICD-10-CM

## 2014-12-04 DIAGNOSIS — T80212A Local infection due to central venous catheter, initial encounter: Secondary | ICD-10-CM

## 2014-12-04 DIAGNOSIS — Z430 Encounter for attention to tracheostomy: Secondary | ICD-10-CM | POA: Diagnosis not present

## 2014-12-04 DIAGNOSIS — E11649 Type 2 diabetes mellitus with hypoglycemia without coma: Secondary | ICD-10-CM | POA: Diagnosis present

## 2014-12-04 DIAGNOSIS — E872 Acidosis: Secondary | ICD-10-CM | POA: Diagnosis present

## 2014-12-04 DIAGNOSIS — Z79899 Other long term (current) drug therapy: Secondary | ICD-10-CM | POA: Diagnosis not present

## 2014-12-04 DIAGNOSIS — R4020 Unspecified coma: Secondary | ICD-10-CM

## 2014-12-04 DIAGNOSIS — R400 Somnolence: Secondary | ICD-10-CM | POA: Diagnosis not present

## 2014-12-04 DIAGNOSIS — E46 Unspecified protein-calorie malnutrition: Secondary | ICD-10-CM | POA: Diagnosis present

## 2014-12-04 DIAGNOSIS — N390 Urinary tract infection, site not specified: Secondary | ICD-10-CM | POA: Diagnosis present

## 2014-12-04 DIAGNOSIS — A412 Sepsis due to unspecified staphylococcus: Secondary | ICD-10-CM | POA: Diagnosis present

## 2014-12-04 DIAGNOSIS — B957 Other staphylococcus as the cause of diseases classified elsewhere: Secondary | ICD-10-CM | POA: Diagnosis present

## 2014-12-04 DIAGNOSIS — Z66 Do not resuscitate: Secondary | ICD-10-CM | POA: Diagnosis present

## 2014-12-04 DIAGNOSIS — G931 Anoxic brain damage, not elsewhere classified: Secondary | ICD-10-CM

## 2014-12-04 DIAGNOSIS — E1165 Type 2 diabetes mellitus with hyperglycemia: Secondary | ICD-10-CM | POA: Diagnosis present

## 2014-12-04 DIAGNOSIS — R403 Persistent vegetative state: Secondary | ICD-10-CM | POA: Diagnosis present

## 2014-12-04 DIAGNOSIS — G92 Toxic encephalopathy: Principal | ICD-10-CM | POA: Diagnosis present

## 2014-12-04 DIAGNOSIS — N170 Acute kidney failure with tubular necrosis: Secondary | ICD-10-CM | POA: Diagnosis present

## 2014-12-04 DIAGNOSIS — G35 Multiple sclerosis: Secondary | ICD-10-CM | POA: Diagnosis present

## 2014-12-04 DIAGNOSIS — G039 Meningitis, unspecified: Secondary | ICD-10-CM | POA: Diagnosis not present

## 2014-12-04 DIAGNOSIS — J9601 Acute respiratory failure with hypoxia: Secondary | ICD-10-CM | POA: Insufficient documentation

## 2014-12-04 DIAGNOSIS — E870 Hyperosmolality and hypernatremia: Secondary | ICD-10-CM | POA: Diagnosis not present

## 2014-12-04 DIAGNOSIS — G009 Bacterial meningitis, unspecified: Secondary | ICD-10-CM | POA: Insufficient documentation

## 2014-12-04 DIAGNOSIS — Z931 Gastrostomy status: Secondary | ICD-10-CM

## 2014-12-04 DIAGNOSIS — R4182 Altered mental status, unspecified: Secondary | ICD-10-CM | POA: Insufficient documentation

## 2014-12-04 DIAGNOSIS — E162 Hypoglycemia, unspecified: Secondary | ICD-10-CM

## 2014-12-04 DIAGNOSIS — R1310 Dysphagia, unspecified: Secondary | ICD-10-CM | POA: Insufficient documentation

## 2014-12-04 DIAGNOSIS — E876 Hypokalemia: Secondary | ICD-10-CM | POA: Diagnosis not present

## 2014-12-04 DIAGNOSIS — R4189 Other symptoms and signs involving cognitive functions and awareness: Secondary | ICD-10-CM | POA: Insufficient documentation

## 2014-12-04 DIAGNOSIS — Z781 Physical restraint status: Secondary | ICD-10-CM | POA: Diagnosis not present

## 2014-12-04 DIAGNOSIS — T80211A Bloodstream infection due to central venous catheter, initial encounter: Secondary | ICD-10-CM | POA: Diagnosis present

## 2014-12-04 DIAGNOSIS — J9690 Respiratory failure, unspecified, unspecified whether with hypoxia or hypercapnia: Secondary | ICD-10-CM | POA: Insufficient documentation

## 2014-12-04 DIAGNOSIS — J9600 Acute respiratory failure, unspecified whether with hypoxia or hypercapnia: Secondary | ICD-10-CM | POA: Diagnosis not present

## 2014-12-04 DIAGNOSIS — Z791 Long term (current) use of non-steroidal anti-inflammatories (NSAID): Secondary | ICD-10-CM | POA: Diagnosis not present

## 2014-12-04 DIAGNOSIS — Z9289 Personal history of other medical treatment: Secondary | ICD-10-CM

## 2014-12-04 DIAGNOSIS — Z794 Long term (current) use of insulin: Secondary | ICD-10-CM | POA: Diagnosis not present

## 2014-12-04 DIAGNOSIS — R404 Transient alteration of awareness: Secondary | ICD-10-CM | POA: Diagnosis not present

## 2014-12-04 DIAGNOSIS — R0600 Dyspnea, unspecified: Secondary | ICD-10-CM | POA: Diagnosis not present

## 2014-12-04 DIAGNOSIS — G9340 Encephalopathy, unspecified: Secondary | ICD-10-CM | POA: Diagnosis not present

## 2014-12-04 DIAGNOSIS — R509 Fever, unspecified: Secondary | ICD-10-CM

## 2014-12-04 DIAGNOSIS — N179 Acute kidney failure, unspecified: Secondary | ICD-10-CM | POA: Diagnosis not present

## 2014-12-04 HISTORY — DX: Obesity, unspecified: E66.9

## 2014-12-04 HISTORY — DX: Gout, unspecified: M10.9

## 2014-12-04 LAB — COMPREHENSIVE METABOLIC PANEL
ALBUMIN: 3.8 g/dL (ref 3.5–5.2)
ALT: 13 U/L (ref 0–35)
AST: 25 U/L (ref 0–37)
Alkaline Phosphatase: 73 U/L (ref 39–117)
Anion gap: 8 (ref 5–15)
BUN: 27 mg/dL — ABNORMAL HIGH (ref 6–23)
CALCIUM: 9.1 mg/dL (ref 8.4–10.5)
CO2: 21 mmol/L (ref 19–32)
CREATININE: 1.14 mg/dL — AB (ref 0.50–1.10)
Chloride: 109 mmol/L (ref 96–112)
GFR calc Af Amer: 59 mL/min — ABNORMAL LOW (ref >90)
GFR, EST NON AFRICAN AMERICAN: 51 mL/min — AB (ref >90)
Glucose, Bld: 95 mg/dL (ref 70–99)
Potassium: 5.9 mmol/L — ABNORMAL HIGH (ref 3.5–5.1)
Sodium: 138 mmol/L (ref 135–145)
Total Bilirubin: 0.4 mg/dL (ref 0.3–1.2)
Total Protein: 7.5 g/dL (ref 6.0–8.3)

## 2014-12-04 LAB — BLOOD GAS, ARTERIAL
Acid-base deficit: 4.5 mmol/L — ABNORMAL HIGH (ref 0.0–2.0)
BICARBONATE: 18.4 meq/L — AB (ref 20.0–24.0)
DRAWN BY: 112491
FIO2: 1 %
MECHVT: 500 mL
O2 Saturation: 99.9 %
PATIENT TEMPERATURE: 100.6
PCO2 ART: 28.7 mmHg — AB (ref 35.0–45.0)
PEEP: 5 cmH2O
RATE: 15 resp/min
TCO2: 17.2 mmol/L (ref 0–100)
pH, Arterial: 7.428 (ref 7.350–7.450)
pO2, Arterial: 445 mmHg — ABNORMAL HIGH (ref 80.0–100.0)

## 2014-12-04 LAB — I-STAT TROPONIN, ED: TROPONIN I, POC: 0 ng/mL (ref 0.00–0.08)

## 2014-12-04 LAB — I-STAT CHEM 8, ED
BUN: 30 mg/dL — ABNORMAL HIGH (ref 6–23)
CALCIUM ION: 1.17 mmol/L (ref 1.13–1.30)
Chloride: 107 mmol/L (ref 96–112)
Creatinine, Ser: 1 mg/dL (ref 0.50–1.10)
GLUCOSE: 92 mg/dL (ref 70–99)
HEMATOCRIT: 33 % — AB (ref 36.0–46.0)
HEMOGLOBIN: 11.2 g/dL — AB (ref 12.0–15.0)
Potassium: 5.8 mmol/L — ABNORMAL HIGH (ref 3.5–5.1)
Sodium: 139 mmol/L (ref 135–145)
TCO2: 20 mmol/L (ref 0–100)

## 2014-12-04 LAB — URINALYSIS, ROUTINE W REFLEX MICROSCOPIC
Bilirubin Urine: NEGATIVE
GLUCOSE, UA: 100 mg/dL — AB
Hgb urine dipstick: NEGATIVE
Ketones, ur: NEGATIVE mg/dL
Nitrite: NEGATIVE
PH: 6 (ref 5.0–8.0)
Protein, ur: NEGATIVE mg/dL
Specific Gravity, Urine: 1.017 (ref 1.005–1.030)
Urobilinogen, UA: 0.2 mg/dL (ref 0.0–1.0)

## 2014-12-04 LAB — CBG MONITORING, ED
GLUCOSE-CAPILLARY: 61 mg/dL — AB (ref 70–99)
Glucose-Capillary: 84 mg/dL (ref 70–99)
Glucose-Capillary: 67 mg/dL — ABNORMAL LOW (ref 70–99)

## 2014-12-04 LAB — CBC WITH DIFFERENTIAL/PLATELET
BASOS ABS: 0 10*3/uL (ref 0.0–0.1)
Basophils Relative: 0 % (ref 0–1)
Eosinophils Absolute: 0 10*3/uL (ref 0.0–0.7)
Eosinophils Relative: 0 % (ref 0–5)
HCT: 31.2 % — ABNORMAL LOW (ref 36.0–46.0)
HEMOGLOBIN: 10.2 g/dL — AB (ref 12.0–15.0)
LYMPHS PCT: 12 % (ref 12–46)
Lymphs Abs: 1 10*3/uL (ref 0.7–4.0)
MCH: 30.8 pg (ref 26.0–34.0)
MCHC: 32.7 g/dL (ref 30.0–36.0)
MCV: 94.3 fL (ref 78.0–100.0)
Monocytes Absolute: 0.6 10*3/uL (ref 0.1–1.0)
Monocytes Relative: 7 % (ref 3–12)
NEUTROS ABS: 7.1 10*3/uL (ref 1.7–7.7)
Neutrophils Relative %: 81 % — ABNORMAL HIGH (ref 43–77)
Platelets: 275 10*3/uL (ref 150–400)
RBC: 3.31 MIL/uL — AB (ref 3.87–5.11)
RDW: 14.4 % (ref 11.5–15.5)
WBC: 8.7 10*3/uL (ref 4.0–10.5)

## 2014-12-04 LAB — URINE MICROSCOPIC-ADD ON

## 2014-12-04 LAB — PROTIME-INR
INR: 0.97 (ref 0.00–1.49)
Prothrombin Time: 13 seconds (ref 11.6–15.2)

## 2014-12-04 LAB — ETHANOL

## 2014-12-04 LAB — I-STAT CG4 LACTIC ACID, ED: Lactic Acid, Venous: 1.34 mmol/L (ref 0.5–2.0)

## 2014-12-04 MED ORDER — SODIUM CHLORIDE 0.9 % IV BOLUS (SEPSIS)
1000.0000 mL | Freq: Once | INTRAVENOUS | Status: AC
  Administered 2014-12-04: 1000 mL via INTRAVENOUS

## 2014-12-04 MED ORDER — SODIUM CHLORIDE 0.9 % IV SOLN: INTRAVENOUS | Status: DC

## 2014-12-04 MED ORDER — DEXTROSE 50 % IV SOLN
50.0000 mL | Freq: Once | INTRAVENOUS | Status: DC
  Administered 2014-12-04: 50 mL via INTRAVENOUS
  Filled 2014-12-04: qty 50

## 2014-12-04 MED ORDER — LIDOCAINE HCL (CARDIAC) 20 MG/ML IV SOLN
INTRAVENOUS | Status: AC
  Filled 2014-12-04: qty 5

## 2014-12-04 MED ORDER — ROCURONIUM BROMIDE 50 MG/5ML IV SOLN
INTRAVENOUS | Status: AC
  Filled 2014-12-04: qty 2

## 2014-12-04 MED ORDER — DEXTROSE 5 % IV SOLN
2.0000 g | INTRAVENOUS | Status: DC
  Filled 2014-12-04: qty 2

## 2014-12-04 MED ORDER — ACETAMINOPHEN 650 MG RE SUPP
650.0000 mg | Freq: Once | RECTAL | Status: AC
  Administered 2014-12-04: 650 mg via RECTAL
  Filled 2014-12-04: qty 1

## 2014-12-04 MED ORDER — M.V.I. ADULT IV INJ
INJECTION | Freq: Once | INTRAVENOUS | Status: AC
  Administered 2014-12-04: 22:00:00 via INTRAVENOUS
  Filled 2014-12-04: qty 1000

## 2014-12-04 MED ORDER — SODIUM CHLORIDE 0.9 % IV SOLN
1.0000 g | Freq: Once | INTRAVENOUS | Status: AC
  Administered 2014-12-05: 1 g via INTRAVENOUS
  Filled 2014-12-04: qty 10

## 2014-12-04 MED ORDER — PROPOFOL 10 MG/ML IV EMUL
5.0000 ug/kg/min | INTRAVENOUS | Status: DC
  Administered 2014-12-04: 5 ug/kg/min via INTRAVENOUS
  Filled 2014-12-04: qty 100

## 2014-12-04 MED ORDER — DEXTROSE 5 % IV SOLN
2.0000 g | Freq: Once | INTRAVENOUS | Status: AC
  Administered 2014-12-04: 2 g via INTRAVENOUS

## 2014-12-04 MED ORDER — INSULIN ASPART 100 UNIT/ML ~~LOC~~ SOLN: 5.0000 [IU] | Freq: Once | SUBCUTANEOUS | Status: DC

## 2014-12-04 MED ORDER — ETOMIDATE 2 MG/ML IV SOLN
INTRAVENOUS | Status: AC
  Administered 2014-12-04: 15 mg
  Filled 2014-12-04: qty 20

## 2014-12-04 MED ORDER — DEXTROSE 50 % IV SOLN
25.0000 mL | Freq: Once | INTRAVENOUS | Status: AC
Start: 2014-12-04 — End: 2014-12-04
  Administered 2014-12-04: 25 mL via INTRAVENOUS
  Filled 2014-12-04: qty 50

## 2014-12-04 MED ORDER — VANCOMYCIN HCL 10 G IV SOLR
2000.0000 mg | Freq: Once | INTRAVENOUS | Status: DC
  Administered 2014-12-04: 2000 mg via INTRAVENOUS
  Filled 2014-12-04: qty 2000

## 2014-12-04 MED ORDER — SUCCINYLCHOLINE CHLORIDE 20 MG/ML IJ SOLN
INTRAMUSCULAR | Status: AC
  Administered 2014-12-04: 100 mg
  Filled 2014-12-04: qty 1

## 2014-12-04 NOTE — ED Notes (Signed)
Bed: WA20 Expected date:  Expected time:  Means of arrival:  Comments: Ems- low glucose

## 2014-12-04 NOTE — ED Notes (Signed)
Dr.Desai at bedside

## 2014-12-04 NOTE — Progress Notes (Signed)
ANTIBIOTIC CONSULT NOTE - INITIAL  Pharmacy Consult for Ceftriaxone Indication: Urosepsis  No Known Allergies  Patient Measurements: Height: 5\' 4"  (162.6 cm) Weight: 240 lb (108.863 kg) IBW/kg (Calculated) : 54.7  Vital Signs: Temp: 100.4 F (38 C) (03/21 1950) Temp Source: Rectal (03/21 1950) BP: 147/108 mmHg (03/21 1950) Pulse Rate: 121 (03/21 1950) Intake/Output from previous day:   Intake/Output from this shift:    Labs: No results for input(s): WBC, HGB, PLT, LABCREA, CREATININE in the last 72 hours. CrCl cannot be calculated (Patient has no serum creatinine result on file.). No results for input(s): VANCOTROUGH, VANCOPEAK, VANCORANDOM, GENTTROUGH, GENTPEAK, GENTRANDOM, TOBRATROUGH, TOBRAPEAK, TOBRARND, AMIKACINPEAK, AMIKACINTROU, AMIKACIN in the last 72 hours.   Microbiology: No results found for this or any previous visit (from the past 720 hour(s)).  Medical History: Past Medical History  Diagnosis Date  . Diabetes mellitus   . Hypertension   . Hyperlipemia     Assessment: 42 y/oF with PMH of DM, HTN, HLD who presents with hypoglycemia and AMS after being found unresponsive at home.  Patient arrived incontinent of urine with strong foul odor noted. UA shows large leukocytes, many bacteria. Pharmacy consulted to assist with dosing of Ceftriaxone for urosepsis.  3/21 >> Ceftriaxone >>  Tmax: 100.22F WBCs: IP Renal: IP  3/21 blood x 2: sent 3/21 urine: sent   Goal of Therapy:  Appropriate antibiotic dosing for renal function and indication Eradication of infection  Plan:   Ceftriaxone 2g IV q24h due to patient size.  No further dose adjustment needed for renal function. Pharmacy will continue to follow cultures and clinical course peripherally.   Greer Pickerel, PharmD, BCPS Pager: [phone] 12/04/2014 8:21 PM

## 2014-12-04 NOTE — ED Notes (Signed)
Pt to CT scan and returned to room on monitor with nurse, RT and tech without distress noted.

## 2014-12-04 NOTE — ED Notes (Signed)
Patient transported to CT 

## 2014-12-04 NOTE — ED Notes (Signed)
At 2100 Dr. Madilyn Hook at bedside to intubate pt. Inserted 7.5cm ET tube and 25cm tied at lips with confirmation of color changes and bil breath sounds upon auscultation via Dr. Madilyn Hook.

## 2014-12-04 NOTE — ED Provider Notes (Signed)
CSN: [id number]     Arrival date & time 12/04/14  1925 History   First MD Initiated Contact with Patient 12/04/14 1954     Chief Complaint  Patient presents with  . Hypoglycemia     Patient is a 59 y.o. female presenting with hypoglycemia. The history is provided by the EMS personnel. No language interpreter was used.  Hypoglycemia  Gina Cox presents by EMS for being found unresponsive. Initial glucose was 54. Level V caveat due to altered mental status. Patient was given dextrose by EMS and she had an improvement in her blood sugar.  Past Medical History  Diagnosis Date  . Diabetes mellitus   . Hypertension   . Hyperlipemia    History reviewed. No pertinent past surgical history. History reviewed. No pertinent family history. History  Substance Use Topics  . Smoking status: Current Every Day Smoker -- 2.00 packs/day    Types: Cigarettes  . Smokeless tobacco: Not on file  . Alcohol Use: Yes     Comment: monthly   OB History    No data available     Review of Systems  Unable to perform ROS     Allergies  Review of patient's allergies indicates no known allergies.  Home Medications   Prior to Admission medications   Medication Sig Start Date End Date Taking? Authorizing Provider  glimepiride (AMARYL) 2 MG tablet Take 4 mg by mouth daily.     Historical Provider, MD  ibuprofen (ADVIL,MOTRIN) 600 MG tablet Take 600 mg by mouth every 6 (six) hours as needed. Swelling, pain     Historical Provider, MD  insulin glargine (LANTUS) 100 UNIT/ML injection Inject 30 Units into the skin at bedtime.     Historical Provider, MD  lisinopril (PRINIVIL,ZESTRIL) 40 MG tablet Take 40 mg by mouth daily.    Historical Provider, MD  naproxen (NAPROSYN) 500 MG tablet Take 500 mg by mouth 2 (two) times daily as needed. For pain      Historical Provider, MD  nebivolol (BYSTOLIC) 5 MG tablet Take 5 mg by mouth daily.    Historical Provider, MD  pioglitazone-metformin (ACTOPLUS MET) 15-500  MG per tablet Take 2 tablets by mouth daily.     Historical Provider, MD  simvastatin (ZOCOR) 20 MG tablet Take 20 mg by mouth daily.    Historical Provider, MD   BP 147/108 mmHg  Pulse 121  Temp(Src) 100.4 F (38 C) (Rectal)  Resp 28  Ht '5\' 4"'  (1.626 m)  Wt 240 lb (108.863 kg)  BMI 41.18 kg/m2  SpO2 100% Physical Exam  Constitutional: She appears well-developed and well-nourished.  HENT:  Head: Normocephalic and atraumatic.  Eyes: Pupils are equal, round, and reactive to light.  Cardiovascular: Regular rhythm.   No murmur heard. Tachycardic  Pulmonary/Chest: Breath sounds normal.  Tachypneic, occasional rhonchi  Abdominal: Soft. There is no tenderness. There is no rebound and no guarding.  Musculoskeletal: She exhibits no edema or tenderness.  Neurological: She is alert.  Lethargic and intermittently withdraws to painful stimuli,  moans to painful stimuli   Skin: Skin is warm and dry.  Psychiatric: She has a normal mood and affect. Her behavior is normal.  Nursing note and vitals reviewed.   ED Course  Procedures (including critical care time) INTUBATION Performed by: Quintella Reichert  Required items: required blood products, implants, devices, and special equipment available Patient identity confirmed: provided demographic data and hospital-assigned identification number Time out: Immediately prior to procedure a "time out" was called to  verify the correct patient, procedure, equipment, support staff and site/side marked as required.  Indications: airway protection  Intubation method: Glidescope Laryngoscopy   Preoxygenation: BVM  Sedatives: Etomidate Paralytic: Succinylcholine  Tube Size: 7.5cuffed  Post-procedure assessment: chest rise and ETCO2 monitor Breath sounds: equal and absent over the epigastrium Tube secured with: ETT holder Chest x-ray interpreted by radiologist and me.  Chest x-ray findings: endotracheal tube in appropriate position  Patient  tolerated the procedure well with no immediate complications.  CRITICAL CARE Performed by: Quintella Reichert   Total critical care time: 30 minutes  Critical care time was exclusive of separately billable procedures and treating other patients.  Critical care was necessary to treat or prevent imminent or life-threatening deterioration.  Critical care was time spent personally by me on the following activities: development of treatment plan with patient and/or surrogate as well as nursing, discussions with consultants, evaluation of patient's response to treatment, examination of patient, obtaining history from patient or surrogate, ordering and performing treatments and interventions, ordering and review of laboratory studies, ordering and review of radiographic studies, pulse oximetry and re-evaluation of patient's condition.   Labs Review Labs Reviewed  CBC WITH DIFFERENTIAL/PLATELET - Abnormal; Notable for the following:    RBC 3.31 (*)    Hemoglobin 10.2 (*)    HCT 31.2 (*)    Neutrophils Relative % 81 (*)    All other components within normal limits  COMPREHENSIVE METABOLIC PANEL - Abnormal; Notable for the following:    Potassium 5.9 (*)    BUN 27 (*)    Creatinine, Ser 1.14 (*)    GFR calc non Af Amer 51 (*)    GFR calc Af Amer 59 (*)    All other components within normal limits  URINALYSIS, ROUTINE W REFLEX MICROSCOPIC - Abnormal; Notable for the following:    APPearance CLOUDY (*)    Glucose, UA 100 (*)    Leukocytes, UA SMALL (*)    All other components within normal limits  BLOOD GAS, ARTERIAL - Abnormal; Notable for the following:    pCO2 arterial 28.7 (*)    pO2, Arterial 445.0 (*)    Bicarbonate 18.4 (*)    Acid-base deficit 4.5 (*)    All other components within normal limits  I-STAT CHEM 8, ED - Abnormal; Notable for the following:    Potassium 5.8 (*)    BUN 30 (*)    Hemoglobin 11.2 (*)    HCT 33.0 (*)    All other components within normal limits  CBG  MONITORING, ED - Abnormal; Notable for the following:    Glucose-Capillary 67 (*)    All other components within normal limits  CULTURE, BLOOD (ROUTINE X 2)  CULTURE, BLOOD (ROUTINE X 2)  URINE CULTURE  URINE MICROSCOPIC-ADD ON  PROTIME-INR  BLOOD GAS, VENOUS  ETHANOL  URINE RAPID DRUG SCREEN (HOSP PERFORMED)  CBG MONITORING, ED  I-STAT CG4 LACTIC ACID, ED  I-STAT VENOUS BLOOD GAS, ED  I-STAT TROPOININ, ED  I-STAT CHEM 8, ED  I-STAT CG4 LACTIC ACID, ED    Imaging Review Ct Head Wo Contrast  12/04/2014   CLINICAL DATA:  Found unresponsive.  Hypoglycemia  EXAM: CT HEAD WITHOUT CONTRAST  TECHNIQUE: Contiguous axial images were obtained from the base of the skull through the vertex without intravenous contrast.  COMPARISON:  08/01/2012  FINDINGS: There is no intracranial hemorrhage, mass or evidence of acute infarction. There is mild generalized atrophy. There is mild chronic microvascular ischemic change. There is no  significant extra-axial fluid collection.  No acute intracranial findings are evident.  There is mild foamy secretions in the left sphenoid sinus.  IMPRESSION: No acute intracranial findings. There is mild generalized atrophy and chronic microvascular ischemic change.   Electronically Signed   By: Andreas Newport M.D.   On: 12/04/2014 22:22   Dg Chest Port 1 View  12/04/2014   CLINICAL DATA:  Post intubation  EXAM: PORTABLE CHEST - 1 VIEW  COMPARISON:  12/04/2014 at 20:24  FINDINGS: The endotracheal tube is 1.3 cm above the carina. The lungs are clear. There is no pneumothorax. No large effusions. Heart size is normal and unchanged.  IMPRESSION: ET tube is a short distance above the carina.   Electronically Signed   By: Andreas Newport M.D.   On: 12/04/2014 22:12   Dg Chest Port 1 View  12/04/2014   CLINICAL DATA:  Patient is unresponsive, respiratory distress.  EXAM: PORTABLE CHEST - 1 VIEW  COMPARISON:  August 23, 2012, August 01, 2012  FINDINGS: There is prominence  opacity of right suprahilar space. The heart size is normal. There is no focal infiltrate, pulmonary edema, or pleural effusion. The visualized skeletal structures are unremarkable.  IMPRESSION: Prominent opacity of the right suprahilar space. Further evaluation with chest CT is recommended to exclude underlying mass.   Electronically Signed   By: Abelardo Diesel M.D.   On: 12/04/2014 21:12     EKG Interpretation None      MDM   Final diagnoses:  Altered mental status, unspecified altered mental status type  Hypoglycemia  Hyperkalemia   Patient brought in for episode of altered mental status. After ED arrival family provides additional history that patient was found unresponsive this evening. Last seen normal was yesterday. Daughter states that she's been having decreased appetite lately and has intermittent episodes of unresponsiveness. She had an episode yesterday where she was found on the floor and would not arouse for about 30 minutes and then she has to go to the grocery. Daughter states that the patient does drugs and likes to drink alcohol.  Patient intubated for airway protection in the emergency department given depressed level of consciousness. CT scan without any acute intracranial abnormality. Patient started on empiric antibiotics for possible urinary tract infection on ED arrival. When given additional history from family patient's antibodies were escalated to cover for possible meningitis. Consulted with critical care admission for management of altered mental status. Seizure activity thought to be unlikely given the patient will withdraw to painful stimuli.    Quintella Reichert, MD 12/04/14 2308

## 2014-12-04 NOTE — H&P (Signed)
PULMONARY / CRITICAL CARE MEDICINE   Name: Gina Cox MRN: [id number] DOB: 1956/02/12    ADMISSION DATE:  12/04/2014 CONSULTATION DATE:  12/04/2014  REFERRING MD :  Ralene Bathe EDP  CHIEF COMPLAINT:  Found down  INITIAL PRESENTATION: 59 year old F brought to the [facility] ED on 12/04/2014 after being found down at home by daughter.  Last seen normal 1 day prior. Required intubation in the emergency department for airway protection.  PCCM called to admit.  NOTE:  PER EDP, PT WAS A DIFFICULT AIRWAY!!!  STUDIES:  3/21 CT head >>> no acute intracranial process  SIGNIFICANT EVENTS: 3/21 - admit  HISTORY OF PRESENT ILLNESS:  This is a 59 year old female who has a past medical history significant for hypertension, diabetes, and polysubstance abuse who was brought to the [facility] ED 3/21 after being found down unresponsive at home by her daughter.  She was last seen normal 1 day prior around 1PM.  Daughter states that this has happened before whenever pt does not eat and then goes to sleep.  Apparently this has been occuring more over the past 4 - 5 days.  When daughter went to visit her one day earlier, pt was on the ground but refused that she be brought to ED for evaluation as she was adamant that nothing was wrong with her.  Pt asked daughter to take her to the grocery store and per daughter, pt acted her normal self while shopping and on return back home.  When she left, pt was in her USOH.  On 3/21 when daughter got off work, she returned to pt's house and this is when she found her down and unresponsive.  From her description, it seems that pt was agonal at the time with very shallow respirations as well as "gurgling / snoring" per daughter. On EMS arrival, initial CG was 54 (improved to 197 after 25g D50).  Daughter reports that pt drinks alcohol and does do drugs (marijuana and crack cocaine is what she knows of).  Last UDS in 2013 positive for cocaine.  PAST MEDICAL HISTORY :   has a past medical history  of Diabetes mellitus; Hypertension; and Hyperlipemia.  has no past surgical history on file. Prior to Admission medications   Medication Sig Start Date End Date Taking? Authorizing Provider  amLODipine (NORVASC) 5 MG tablet Take 5 mg by mouth daily.   Yes Historical Provider, MD  glimepiride (AMARYL) 2 MG tablet Take 4 mg by mouth daily.     Historical Provider, MD  ibuprofen (ADVIL,MOTRIN) 600 MG tablet Take 600 mg by mouth every 6 (six) hours as needed. Swelling, pain     Historical Provider, MD  insulin glargine (LANTUS) 100 UNIT/ML injection Inject 30 Units into the skin at bedtime.     Historical Provider, MD  lisinopril (PRINIVIL,ZESTRIL) 40 MG tablet Take 40 mg by mouth daily.    Historical Provider, MD  naproxen (NAPROSYN) 500 MG tablet Take 500 mg by mouth 2 (two) times daily as needed. For pain      Historical Provider, MD  nebivolol (BYSTOLIC) 5 MG tablet Take 5 mg by mouth daily.    Historical Provider, MD  pioglitazone-metformin (ACTOPLUS MET) 15-500 MG per tablet Take 2 tablets by mouth daily.     Historical Provider, MD  simvastatin (ZOCOR) 20 MG tablet Take 20 mg by mouth daily.    Historical Provider, MD   No Known Allergies  FAMILY HISTORY:  has no family status information on file.  SOCIAL HISTORY:  reports that she has been smoking Cigarettes.  She has been smoking about 2.00 packs per day. She does not have any smokeless tobacco history on file. She reports that she drinks alcohol. She reports that she uses illicit drugs (Cocaine and Marijuana).  REVIEW OF SYSTEMS:  Cannot obtain secondary to acute encephalopathy  SUBJECTIVE:   VITAL SIGNS: Temp:  [100.4 F (38 C)-100.6 F (38.1 C)] 100.6 F (38.1 C) (03/21 2152) Pulse Rate:  [93-122] 98 (03/21 2230) Resp:  [17-31] 18 (03/21 2230) BP: (129-208)/(45-108) 149/45 mmHg (03/21 2230) SpO2:  [99 %-100 %] 100 % (03/21 2230) FiO2 (%):  [100 %] 100 % (03/21 2230) Weight:  [108.863 kg (240 lb)] 108.863 kg (240 lb) (03/21  1950) HEMODYNAMICS:   VENTILATOR SETTINGS: Vent Mode:  [-] PRVC FiO2 (%):  [100 %] 100 % Set Rate:  [15 bmp] 15 bmp Vt Set:  [500 mL] 500 mL PEEP:  [5 cmH20] 5 cmH20 INTAKE / OUTPUT: No intake or output data in the 24 hours ending 12/04/14 2237  PHYSICAL EXAMINATION: General:  AA female, appears older than stated age, in NAD. Neuro: Sedated on vent.  Does not follow any commands.  Withdraws to pain. No meningismus. HEENT: [place]/AT. PERRL, sclerae anicteric. Cardiovascular: RRR, no M/R/G.  Lungs: Respirations even and unlabored.  CTA bilaterally, No W/R/R Abdomen: Oese, BS hypoactive, soft, NT/ND.  Musculoskeletal: No gross deformities, no edema.  Skin: Intact, warm, no rashes.   LABS:  CBC  Recent Labs Lab 12/04/14 2028 12/04/14 2031  WBC  --  8.7  HGB 11.2* 10.2*  HCT 33.0* 31.2*  PLT  --  275   Coag's No results for input(s): APTT, INR in the last 168 hours. BMET  Recent Labs Lab 12/04/14 2028 12/04/14 2031  NA 139 138  K 5.8* 5.9*  CL 107 109  CO2  --  21  BUN 30* 27*  CREATININE 1.00 1.14*  GLUCOSE 92 95   Electrolytes  Recent Labs Lab 12/04/14 2031  CALCIUM 9.1   Sepsis Markers  Recent Labs Lab 12/04/14 2039  LATICACIDVEN 1.34   ABG  Recent Labs Lab 12/04/14 2200  PHART 7.428  PCO2ART 28.7*  PO2ART 445.0*   Liver Enzymes  Recent Labs Lab 12/04/14 2031  AST 25  ALT 13  ALKPHOS 73  BILITOT 0.4  ALBUMIN 3.8   Cardiac Enzymes No results for input(s): TROPONINI, PROBNP in the last 168 hours. Glucose  Recent Labs Lab 12/04/14 1955 12/04/14 2102  GLUCAP 84 67*   Imaging No results found.  ASSESSMENT / PLAN:  NEUROLOGIC A:   Acute metabolic encephalopathy ETOH use  Polysubstance abuse P:   Sedation:  All sedation off. RASS goal: 0 to -1. Daily WUA. Thiamine / Folate. UDS negative. Ammonia 60, RPR, HIV, TSH 0.773. Start lactulose. Neurology consult called. EEG ordered. MRI ordered.  PULMONARY OETT 3/21  >>> A:  Acute respiratory failure secondary to inability to protect airway Tobacco use disorder DIFFICULT AIRWAY PER EDP - very anterior with small airway P:   Full vent support, wean as able. VAP bundle. Begin PS trials but no extubation given mental status. CXR and ABG in AM. Tobacco cessation counseling if improves. Soft wrist restraints in attempt to decrease chance of self extubation.  CARDIOVASCULAR A: Hx HTN, HLD, cocaine abuse P:  Monitor hemodynamics. Repeat troponin. Hold outpatient lisinopril, nebivolol, simvastatin. Restart amlodipine for BP control.  RENAL A:  CKD Hyperkalemia P:   D5 NS '@100' , KVO once TF are at goal. BMP  in AM. Replace electrolytes as indicated.  GASTROINTESTINAL A:  GI prophylaxis Nutrition P:   SUP:  Famotidine. TF per nutrition.  HEMATOLOGIC A:  VTE prophylaxis P:  Heparin. CBC in AM.  INFECTIOUS A:   Consider meningitis - exam not impressive for meningitis, but will consider given degree of AMS  P:   BCx2 3/21 > UC 3/21 > Abx: Vanc, start date 3/21, day 1/x.  Abx: Ceftriaxone, start date 3/21, day 1/x. Abx: Ampicillin, start date 3/21, day 1/x. Abx: Acyclovir, start date, 3/21, day 1/x. Will continue treatment as meningitis, highly doubt it, if neuro feels not meningitis then will D/C. PCT <10 will likely d/c after neurology sees patient.  ENDOCRINE A:  DM P:   CBG's q4hrs. SSI. Hold outpatient glimepiride, lantus, pioglitazone-metformin.   FAMILY  - Updates:  No family bedside.  - Inter-disciplinary family meet or Palliative Care meeting due by:  12/10/14.  Note for services rendered 3/22.  Above note edited in full.  The patient is critically ill with multiple organ systems failure and requires high complexity decision making for assessment and support, frequent evaluation and titration of therapies, application of advanced monitoring technologies and extensive interpretation of multiple databases.    Critical Care Time devoted to patient care services described in this note is  35  Minutes. This time reflects time of care of this signee Dr Jennet Maduro. This critical care time does not reflect procedure time, or teaching time or supervisory time of PA/NP/Med student/Med Resident etc but could involve care discussion time.  Rush Farmer, M.D. [facility] Pulmonary/Critical Care Medicine. Pager: [phone]. After hours pager: [phone].  12/04/2014, 11:33 PM

## 2014-12-04 NOTE — ED Notes (Signed)
Dght at bedside and reported that pt does street drugs and drink alcohol. Pt has hx of HTN, DM, and gout.

## 2014-12-04 NOTE — ED Notes (Addendum)
Pt arrived via EMS with report of dght found pt unresponsive in apt. Initial CBG was 54. Pt was given 25gm of D50 IV and NS with improvement of CBG to 197. Pt arrived incontinent of urine with strong foul odor noted. Pt continues to be lethargic, snoring resp, pupils sluggish to respond to light, and occ congested cough. Auscultated ins/exp congested in bil lobes throughout. ST on monitor. CBG at triage 84.

## 2014-12-05 ENCOUNTER — Inpatient Hospital Stay (HOSPITAL_COMMUNITY): Payer: Medicare Other

## 2014-12-05 ENCOUNTER — Inpatient Hospital Stay (HOSPITAL_COMMUNITY)
Admit: 2014-12-05 | Discharge: 2014-12-05 | Disposition: A | Discharge status: Home / Self Care | Payer: Medicare Other | Attending: Pulmonary Disease | Admitting: Pulmonary Disease

## 2014-12-05 DIAGNOSIS — G9340 Encephalopathy, unspecified: Secondary | ICD-10-CM

## 2014-12-05 DIAGNOSIS — R4182 Altered mental status, unspecified: Secondary | ICD-10-CM | POA: Insufficient documentation

## 2014-12-05 DIAGNOSIS — J9601 Acute respiratory failure with hypoxia: Secondary | ICD-10-CM | POA: Insufficient documentation

## 2014-12-05 LAB — URINE MICROSCOPIC-ADD ON

## 2014-12-05 LAB — RAPID URINE DRUG SCREEN, HOSP PERFORMED
Amphetamines: NOT DETECTED
Barbiturates: NOT DETECTED
Benzodiazepines: NOT DETECTED
COCAINE: NOT DETECTED
OPIATES: NOT DETECTED
TETRAHYDROCANNABINOL: NOT DETECTED

## 2014-12-05 LAB — URINALYSIS, ROUTINE W REFLEX MICROSCOPIC
Bilirubin Urine: NEGATIVE
GLUCOSE, UA: NEGATIVE mg/dL
Ketones, ur: NEGATIVE mg/dL
LEUKOCYTES UA: NEGATIVE
Nitrite: NEGATIVE
Protein, ur: NEGATIVE mg/dL
Specific Gravity, Urine: 1.012 (ref 1.005–1.030)
Urobilinogen, UA: 0.2 mg/dL (ref 0.0–1.0)
pH: 6 (ref 5.0–8.0)

## 2014-12-05 LAB — HIV ANTIBODY (ROUTINE TESTING W REFLEX): HIV Screen 4th Generation wRfx: NONREACTIVE

## 2014-12-05 LAB — GLUCOSE, CAPILLARY
GLUCOSE-CAPILLARY: 117 mg/dL — AB (ref 70–99)
Glucose-Capillary: 128 mg/dL — ABNORMAL HIGH (ref 70–99)
Glucose-Capillary: 135 mg/dL — ABNORMAL HIGH (ref 70–99)
Glucose-Capillary: 40 mg/dL — CL (ref 70–99)
Glucose-Capillary: 47 mg/dL — ABNORMAL LOW (ref 70–99)
Glucose-Capillary: 64 mg/dL — ABNORMAL LOW (ref 70–99)
Glucose-Capillary: 68 mg/dL — ABNORMAL LOW (ref 70–99)
Glucose-Capillary: 70 mg/dL (ref 70–99)

## 2014-12-05 LAB — RPR: RPR Ser Ql: NONREACTIVE

## 2014-12-05 LAB — URINE CULTURE

## 2014-12-05 LAB — BLOOD GAS, ARTERIAL
Acid-base deficit: 5.7 mmol/L — ABNORMAL HIGH (ref 0.0–2.0)
BICARBONATE: 18.3 meq/L — AB (ref 20.0–24.0)
DRAWN BY: 235321
FIO2: 0.3 %
LHR: 14 {breaths}/min
MECHVT: 440 mL
O2 SAT: 98.8 %
PCO2 ART: 32.8 mmHg — AB (ref 35.0–45.0)
PEEP: 5 cmH2O
PO2 ART: 136 mmHg — AB (ref 80.0–100.0)
Patient temperature: 100
TCO2: 17.3 mmol/L (ref 0–100)
pH, Arterial: 7.369 (ref 7.350–7.450)

## 2014-12-05 LAB — CORTISOL: CORTISOL PLASMA: 13.6 ug/dL

## 2014-12-05 LAB — TROPONIN I: Troponin I: <0.03 ng/mL (ref <0.031)

## 2014-12-05 LAB — BASIC METABOLIC PANEL
ANION GAP: 9 (ref 5–15)
BUN: 22 mg/dL (ref 6–23)
CHLORIDE: 112 mmol/L (ref 96–112)
CO2: 19 mmol/L (ref 19–32)
CREATININE: 1.07 mg/dL (ref 0.50–1.10)
Calcium: 8.1 mg/dL — ABNORMAL LOW (ref 8.4–10.5)
GFR calc Af Amer: 64 mL/min — ABNORMAL LOW (ref >90)
GFR calc non Af Amer: 55 mL/min — ABNORMAL LOW (ref >90)
GLUCOSE: 88 mg/dL (ref 70–99)
Potassium: 4.9 mmol/L (ref 3.5–5.1)
Sodium: 140 mmol/L (ref 135–145)

## 2014-12-05 LAB — VITAMIN B12: Vitamin B-12: 455 pg/mL (ref 211–911)

## 2014-12-05 LAB — CBC
HCT: 27 % — ABNORMAL LOW (ref 36.0–46.0)
Hemoglobin: 8.8 g/dL — ABNORMAL LOW (ref 12.0–15.0)
MCH: 31.1 pg (ref 26.0–34.0)
MCHC: 32.6 g/dL (ref 30.0–36.0)
MCV: 95.4 fL (ref 78.0–100.0)
PLATELETS: 218 10*3/uL (ref 150–400)
RBC: 2.83 MIL/uL — ABNORMAL LOW (ref 3.87–5.11)
RDW: 14.6 % (ref 11.5–15.5)
WBC: 8.1 10*3/uL (ref 4.0–10.5)

## 2014-12-05 LAB — MRSA PCR SCREENING: MRSA BY PCR: NEGATIVE

## 2014-12-05 LAB — AMMONIA: Ammonia: 60 umol/L — ABNORMAL HIGH (ref 11–32)

## 2014-12-05 LAB — TSH: TSH: 0.773 u[IU]/mL (ref 0.350–4.500)

## 2014-12-05 LAB — LACTIC ACID, PLASMA: LACTIC ACID, VENOUS: 1.8 mmol/L (ref 0.5–2.0)

## 2014-12-05 LAB — PROCALCITONIN: Procalcitonin: <0.1 ng/mL

## 2014-12-05 MED ORDER — VANCOMYCIN HCL IN DEXTROSE 750-5 MG/150ML-% IV SOLN
750.0000 mg | Freq: Two times a day (BID) | INTRAVENOUS | Status: AC
  Administered 2014-12-05 – 2014-12-06 (×4): 750 mg via INTRAVENOUS
  Filled 2014-12-05 (×4): qty 150

## 2014-12-05 MED ORDER — DEXTROSE 5 % IV SOLN
550.0000 mg | Freq: Three times a day (TID) | INTRAVENOUS | Status: DC
  Administered 2014-12-05 – 2014-12-07 (×8): 550 mg via INTRAVENOUS
  Filled 2014-12-05 (×10): qty 11

## 2014-12-05 MED ORDER — LACTULOSE 10 GM/15ML PO SOLN
20.0000 g | Freq: Three times a day (TID) | ORAL | Status: DC
  Administered 2014-12-05 – 2014-12-08 (×12): 20 g
  Filled 2014-12-05 (×12): qty 30

## 2014-12-05 MED ORDER — GADOBENATE DIMEGLUMINE 529 MG/ML IV SOLN
20.0000 mL | Freq: Once | INTRAVENOUS | Status: AC | PRN
  Administered 2014-12-05: 20 mL via INTRAVENOUS

## 2014-12-05 MED ORDER — DEXTROSE-NACL 5-0.9 % IV SOLN
INTRAVENOUS | Status: DC
  Administered 2014-12-05: 03:00:00 via INTRAVENOUS

## 2014-12-05 MED ORDER — ONDANSETRON HCL 4 MG/2ML IJ SOLN: 4.0000 mg | Freq: Four times a day (QID) | INTRAMUSCULAR | Status: DC | PRN

## 2014-12-05 MED ORDER — FOLIC ACID 5 MG/ML IJ SOLN
1.0000 mg | Freq: Every day | INTRAMUSCULAR | Status: DC
  Administered 2014-12-06 – 2014-12-07 (×2): 1 mg via INTRAVENOUS
  Filled 2014-12-05 (×5): qty 0.2

## 2014-12-05 MED ORDER — SODIUM POLYSTYRENE SULFONATE 15 GM/60ML PO SUSP
45.0000 g | Freq: Once | ORAL | Status: AC
Start: 2014-12-05 — End: 2014-12-05
  Administered 2014-12-05: 45 g
  Filled 2014-12-05 (×2): qty 180

## 2014-12-05 MED ORDER — LEVETIRACETAM IN NACL 500 MG/100ML IV SOLN
500.0000 mg | Freq: Two times a day (BID) | INTRAVENOUS | Status: DC
  Administered 2014-12-05 – 2014-12-06 (×3): 500 mg via INTRAVENOUS
  Filled 2014-12-05 (×4): qty 100

## 2014-12-05 MED ORDER — AMPICILLIN SODIUM 2 G IJ SOLR
2.0000 g | INTRAMUSCULAR | Status: DC
  Administered 2014-12-05 – 2014-12-07 (×14): 2 g via INTRAVENOUS
  Filled 2014-12-05 (×17): qty 2000

## 2014-12-05 MED ORDER — CETYLPYRIDINIUM CHLORIDE 0.05 % MT LIQD
7.0000 mL | Freq: Four times a day (QID) | OROMUCOSAL | Status: DC
  Administered 2014-12-05 – 2014-12-27 (×88): 7 mL via OROMUCOSAL

## 2014-12-05 MED ORDER — AMLODIPINE BESYLATE 5 MG PO TABS
5.0000 mg | ORAL_TABLET | Freq: Every day | ORAL | Status: DC
  Administered 2014-12-05 – 2014-12-07 (×3): 5 mg
  Filled 2014-12-05 (×4): qty 1

## 2014-12-05 MED ORDER — SODIUM CHLORIDE 0.9 % IV SOLN
250.0000 mL | INTRAVENOUS | Status: DC | PRN
  Administered 2014-12-07: 500 mL via INTRAVENOUS

## 2014-12-05 MED ORDER — HEPARIN SODIUM (PORCINE) 5000 UNIT/ML IJ SOLN
5000.0000 [IU] | Freq: Three times a day (TID) | INTRAMUSCULAR | Status: DC
  Administered 2014-12-05 (×3): 5000 [IU] via SUBCUTANEOUS
  Filled 2014-12-05 (×3): qty 1

## 2014-12-05 MED ORDER — CHLORHEXIDINE GLUCONATE 0.12 % MT SOLN
15.0000 mL | Freq: Two times a day (BID) | OROMUCOSAL | Status: DC
  Administered 2014-12-05 – 2014-12-27 (×45): 15 mL via OROMUCOSAL
  Filled 2014-12-05 (×48): qty 15

## 2014-12-05 MED ORDER — DEXTROSE 50 % IV SOLN
INTRAVENOUS | Status: AC
  Filled 2014-12-05: qty 50

## 2014-12-05 MED ORDER — DEXTROSE 50 % IV SOLN
25.0000 mL | Freq: Once | INTRAVENOUS | Status: AC
  Administered 2014-12-05: 25 mL via INTRAVENOUS

## 2014-12-05 MED ORDER — PRO-STAT SUGAR FREE PO LIQD
30.0000 mL | Freq: Two times a day (BID) | ORAL | Status: DC
  Administered 2014-12-05 – 2014-12-22 (×34): 30 mL via ORAL
  Filled 2014-12-05 (×53): qty 30

## 2014-12-05 MED ORDER — DEXTROSE 50 % IV SOLN
1.0000 | Freq: Once | INTRAVENOUS | Status: DC
Start: 2014-12-05 — End: 2014-12-09
  Filled 2014-12-05: qty 50

## 2014-12-05 MED ORDER — THIAMINE HCL 100 MG/ML IJ SOLN
100.0000 mg | Freq: Every day | INTRAMUSCULAR | Status: DC
  Administered 2014-12-06 – 2014-12-08 (×3): 100 mg via INTRAVENOUS
  Filled 2014-12-05 (×3): qty 2

## 2014-12-05 MED ORDER — DEXTROSE 50 % IV SOLN
INTRAVENOUS | Status: AC
  Administered 2014-12-05: 50 mL
  Filled 2014-12-05: qty 50

## 2014-12-05 MED ORDER — DEXTROSE 50 % IV SOLN
50.0000 mL | Freq: Once | INTRAVENOUS | Status: AC
Start: 2014-12-05 — End: 2014-12-05
  Administered 2014-12-05: 50 mL via INTRAVENOUS

## 2014-12-05 MED ORDER — VITAL HIGH PROTEIN PO LIQD
1000.0000 mL | ORAL | Status: DC
  Administered 2014-12-05 – 2014-12-17 (×14): 1000 mL
  Administered 2014-12-18 (×2)
  Administered 2014-12-18: 1000 mL
  Administered 2014-12-18: 22:00:00
  Administered 2014-12-19 – 2014-12-21 (×3): 1000 mL
  Administered 2014-12-22: 05:00:00
  Administered 2014-12-22: 1000 mL
  Administered 2014-12-22: 06:00:00
  Administered 2014-12-23 – 2014-12-25 (×3): 1000 mL
  Filled 2014-12-05 (×23): qty 1000

## 2014-12-05 MED ORDER — FAMOTIDINE IN NACL 20-0.9 MG/50ML-% IV SOLN
20.0000 mg | Freq: Two times a day (BID) | INTRAVENOUS | Status: DC
  Administered 2014-12-05 – 2014-12-08 (×8): 20 mg via INTRAVENOUS
  Filled 2014-12-05 (×8): qty 50

## 2014-12-05 MED ORDER — CEFTRIAXONE SODIUM IN DEXTROSE 40 MG/ML IV SOLN
2.0000 g | Freq: Two times a day (BID) | INTRAVENOUS | Status: AC
  Administered 2014-12-05 – 2014-12-17 (×26): 2 g via INTRAVENOUS
  Filled 2014-12-05 (×27): qty 50

## 2014-12-05 NOTE — Care Management Note (Signed)
CARE MANAGEMENT NOTE 12/05/2014  Patient:  Gina Cox, Gina Cox   Account Number:  [id number]  Date Initiated:  12/05/2014  Documentation initiated by:  Stitely,Kenijah Benningfield  Subjective/Objective Assessment:   59 year old F brought to the [facility] ED on 12/04/2014 after being found down at home by daughter.  Last seen normal 1 day prior. Required intubation in the emergency department for airway protection     Action/Plan:   tbd   Anticipated DC Date:  12/08/2014   Anticipated DC Plan:  HOME/SELF CARE  In-house referral  Clinical Social Worker      DC Planning Services  CM consult      [facility] Choice  NA   Choice offered to / List presented to:  NA   DME arranged  NA           Status of service:  In process, will continue to follow Medicare Important Message given?   (If response is "NO", the following Medicare IM given date fields will be blank) Date Medicare IM given:   Medicare IM given by:   Date Additional Medicare IM given:   Additional Medicare IM given by:    Discharge Disposition:    Per UR Regulation:  Reviewed for med. necessity/level of care/duration of stay  If discussed at Long Length of Stay Meetings, dates discussed:    Comments:  December 05, 2014/Gina Brenn L. Earlene Plater, RN, BSN, CCM. Case Management [employer] [phone] No discharge needs present of time of review.

## 2014-12-05 NOTE — Progress Notes (Signed)
EEG completed, results pending. 

## 2014-12-05 NOTE — Consult Note (Signed)
Referring Physician: Dr. Nelda Marseille    Chief Complaint: unresponsiveness   HPI:                                                                                                                                         Gina Cox is an 59 y.o. female with a past medical history significant for HTN, DM, hyperlipiemia, polysubstance abuse and gout, admitted to the hospital after being found unresponsive at home. Patient remains unresponsive despite being off sedatives and thus a neurological consultation was requested to further investigate this issue. Gina Cox is intubated on the vent and thus all clinical information is obtained from the hospital records who documented that patient " was brought to the [facility] ED 3/21 after being found down unresponsive at home by her daughter. She was last seen normal 1 day prior around 1PM. Daughter states that this has happened before whenever pt does not eat and then goes to sleep. Apparently this has been occuring more over the past 4 - 5 days. When daughter went to visit her one day earlier, pt was on the ground but refused that she be brought to ED for evaluation as she was adamant that nothing was wrong with her. Pt asked daughter to take her to the grocery store and per daughter, pt acted her normal self while shopping and on return back home. When she left, pt was in her USOH. On 3/21 when daughter got off work, she returned to pt's house and this is when she found her down and unresponsive. From her description, it seems that pt was agonal at the time with very shallow respirations as well as "gurgling / snoring" per daughter.On EMS arrival, initial CG was 54 (improved to 197 after 25g D50).Daughter reports that pt drinks alcohol and does do drugs (marijuana and crack cocaine is what she knows of). Last UDS in 2013 positive for cocaine". Patient started empirically on antibiotics due to concern for meningitis. Further, Ammonia 60, started om Lactulose. ETOH  level <5, UA without infection. CT head was personally reviewed and showed no acute intracranial abnormality.  Date last known well: 3/20 Time last known well: unclear tPA Given: no, out of the window   Past Medical History  Diagnosis Date  . Diabetes mellitus   . Hypertension   . Hyperlipemia   . Gout     History reviewed. No pertinent past surgical history.  History reviewed. No pertinent family history. Social History:  reports that she has been smoking Cigarettes.  She has been smoking about 2.00 packs per day. She does not have any smokeless tobacco history on file. She reports that she drinks alcohol. She reports that she uses illicit drugs (Cocaine and Marijuana).  Allergies: No Known Allergies  Medications:  Scheduled: . acyclovir  550 mg Intravenous 3 times per day  . amLODipine  5 mg Per Tube Daily  . ampicillin (OMNIPEN) IV  2 g Intravenous 6 times per day  . antiseptic oral rinse  7 mL Mouth Rinse QID  . cefTRIAXone (ROCEPHIN)  IV  2 g Intravenous Q12H  . chlorhexidine  15 mL Mouth Rinse BID  . dextrose  1 ampule Intravenous Once  . famotidine (PEPCID) IV  20 mg Intravenous Q12H  . [START ON 5/57/3220] folic acid  1 mg Intravenous Daily  . heparin  5,000 Units Subcutaneous 3 times per day  . lactulose  20 g Per Tube TID  . [START ON 12/06/2014] thiamine  100 mg Intravenous Daily  . vancomycin  750 mg Intravenous Q12H    ROS: unable to obtain due to mental status                                                                                                                                      History obtained from chart review    Physical exam: acutely ill, intubated on the vent. Blood pressure 151/63, pulse 92, temperature 100 F (37.8 C), temperature source Core (Comment), resp. rate 16, height '5\' 4"'  (1.626 m), weight 97.6 kg (215 lb 2.7  oz), SpO2 100 %. Head: normocephalic. Neck: supple, no bruits, no JVD. Cardiac: no murmurs. Lungs: clear. Abdomen: soft, no tender, no mass. Extremities: mild LE edema. Skin: no rash Neurologic Examination:                                                                                                      Patient is off sedatives, intubated on he vent. Mental status: open eyes to sternal rub but doesn't follow commands. CN 2-12: pupils 4 mm bilaterally reactive, no gaze preference, no nystagmus. Corneal responses present, face symmetric, tongue: intubated. Motor: moves all limbs symmetrically upon painful stimuli. Sensory: reacts to pain. Plantars: right upgoing, left downgoing. Coordination and gait: unable to test. No meningeal signs.  Results for orders placed or performed during the hospital encounter of 12/04/14 (from the past 48 hour(s))  CBG monitoring, ED     Status: None   Collection Time: 12/04/14  7:55 PM  Result Value Ref Range   Glucose-Capillary 84 70 - 99 mg/dL  I-Stat Chem 8, ED     Status: Abnormal   Collection Time: 12/04/14  8:28 PM  Result Value Ref Range   Sodium 139 135 - 145 mmol/L  Potassium 5.8 (H) 3.5 - 5.1 mmol/L   Chloride 107 96 - 112 mmol/L   BUN 30 (H) 6 - 23 mg/dL   Creatinine, Ser 1.00 0.50 - 1.10 mg/dL   Glucose, Bld 92 70 - 99 mg/dL   Calcium, Ion 1.17 1.13 - 1.30 mmol/L   TCO2 20 0 - 100 mmol/L   Hemoglobin 11.2 (L) 12.0 - 15.0 g/dL   HCT 33.0 (L) 36.0 - 46.0 %  CBC WITH DIFFERENTIAL     Status: Abnormal   Collection Time: 12/04/14  8:31 PM  Result Value Ref Range   WBC 8.7 4.0 - 10.5 K/uL   RBC 3.31 (L) 3.87 - 5.11 MIL/uL   Hemoglobin 10.2 (L) 12.0 - 15.0 g/dL   HCT 31.2 (L) 36.0 - 46.0 %   MCV 94.3 78.0 - 100.0 fL   MCH 30.8 26.0 - 34.0 pg   MCHC 32.7 30.0 - 36.0 g/dL   RDW 14.4 11.5 - 15.5 %   Platelets 275 150 - 400 K/uL   Neutrophils Relative % 81 (H) 43 - 77 %   Neutro Abs 7.1 1.7 - 7.7 K/uL   Lymphocytes Relative 12 12 -  46 %   Lymphs Abs 1.0 0.7 - 4.0 K/uL   Monocytes Relative 7 3 - 12 %   Monocytes Absolute 0.6 0.1 - 1.0 K/uL   Eosinophils Relative 0 0 - 5 %   Eosinophils Absolute 0.0 0.0 - 0.7 K/uL   Basophils Relative 0 0 - 1 %   Basophils Absolute 0.0 0.0 - 0.1 K/uL  Comprehensive metabolic panel     Status: Abnormal   Collection Time: 12/04/14  8:31 PM  Result Value Ref Range   Sodium 138 135 - 145 mmol/L   Potassium 5.9 (H) 3.5 - 5.1 mmol/L   Chloride 109 96 - 112 mmol/L   CO2 21 19 - 32 mmol/L   Glucose, Bld 95 70 - 99 mg/dL   BUN 27 (H) 6 - 23 mg/dL   Creatinine, Ser 1.14 (H) 0.50 - 1.10 mg/dL   Calcium 9.1 8.4 - 10.5 mg/dL   Total Protein 7.5 6.0 - 8.3 g/dL   Albumin 3.8 3.5 - 5.2 g/dL   AST 25 0 - 37 U/L   ALT 13 0 - 35 U/L   Alkaline Phosphatase 73 39 - 117 U/L   Total Bilirubin 0.4 0.3 - 1.2 mg/dL   GFR calc non Af Amer 51 (L) >90 mL/min   GFR calc Af Amer 59 (L) >90 mL/min    Comment: (NOTE) The eGFR has been calculated using the CKD EPI equation. This calculation has not been validated in all clinical situations. eGFR's persistently <90 mL/min signify possible Chronic Kidney Disease.    Anion gap 8 5 - 15  Protime-INR     Status: None   Collection Time: 12/04/14  8:31 PM  Result Value Ref Range   Prothrombin Time 13.0 11.6 - 15.2 seconds   INR 0.97 0.00 - 1.49    Comment: Performed at [facility]  I-stat troponin, ED (not at [facility])     Status: None   Collection Time: 12/04/14  8:36 PM  Result Value Ref Range   Troponin i, poc 0.00 0.00 - 0.08 ng/mL   Comment 3            Comment: Due to the release kinetics of cTnI, a negative result within the first hours of the onset of symptoms does not rule out myocardial infarction with certainty.  If myocardial infarction is still suspected, repeat the test at appropriate intervals.   I-Stat CG4 Lactic Acid, ED (not at [facility])     Status: None   Collection Time: 12/04/14  8:39 PM  Result Value Ref Range   Lactic Acid,  Venous 1.34 0.5 - 2.0 mmol/L  CBG monitoring, ED     Status: Abnormal   Collection Time: 12/04/14  9:02 PM  Result Value Ref Range   Glucose-Capillary 67 (L) 70 - 99 mg/dL  Urinalysis, Routine w reflex microscopic     Status: Abnormal   Collection Time: 12/04/14  9:30 PM  Result Value Ref Range   Color, Urine YELLOW YELLOW   APPearance CLOUDY (A) CLEAR   Specific Gravity, Urine 1.017 1.005 - 1.030   pH 6.0 5.0 - 8.0   Glucose, UA 100 (A) NEGATIVE mg/dL   Hgb urine dipstick NEGATIVE NEGATIVE   Bilirubin Urine NEGATIVE NEGATIVE   Ketones, ur NEGATIVE NEGATIVE mg/dL   Protein, ur NEGATIVE NEGATIVE mg/dL   Urobilinogen, UA 0.2 0.0 - 1.0 mg/dL   Nitrite NEGATIVE NEGATIVE   Leukocytes, UA SMALL (A) NEGATIVE  Urine microscopic-add on     Status: None   Collection Time: 12/04/14  9:30 PM  Result Value Ref Range   Squamous Epithelial / LPF RARE RARE   WBC, UA 3-6 <3 WBC/hpf   RBC / HPF 0-2 <3 RBC/hpf   Bacteria, UA RARE RARE   Urine-Other LESS THAN 10 mL OF URINE SUBMITTED   Blood gas, arterial     Status: Abnormal   Collection Time: 12/04/14 10:00 PM  Result Value Ref Range   FIO2 1.00 %   Delivery systems VENTILATOR    Mode PRESSURE REGULATED VOLUME CONTROL    VT 500 mL   Rate 15 resp/min   Peep/cpap 5.0 cm H20   pH, Arterial 7.428 7.350 - 7.450   pCO2 arterial 28.7 (L) 35.0 - 45.0 mmHg   pO2, Arterial 445.0 (H) 80.0 - 100.0 mmHg   Bicarbonate 18.4 (L) 20.0 - 24.0 mEq/L   TCO2 17.2 0 - 100 mmol/L   Acid-base deficit 4.5 (H) 0.0 - 2.0 mmol/L   O2 Saturation 99.9 %   Patient temperature 100.6    Collection site RIGHT RADIAL    Drawn by [id number]    Sample type ARTERIAL DRAW    Allens test (pass/fail) PASS PASS  Ethanol     Status: None   Collection Time: 12/04/14 10:13 PM  Result Value Ref Range   Alcohol, Ethyl (B) <5 0 - 9 mg/dL    Comment:        LOWEST DETECTABLE LIMIT FOR SERUM ALCOHOL IS 11 mg/dL FOR MEDICAL PURPOSES ONLY   POC CBG, ED     Status: Abnormal    Collection Time: 12/04/14 11:32 PM  Result Value Ref Range   Glucose-Capillary 61 (L) 70 - 99 mg/dL  Urine rapid drug screen (hosp performed)     Status: None   Collection Time: 12/04/14 11:39 PM  Result Value Ref Range   Opiates NONE DETECTED NONE DETECTED   Cocaine NONE DETECTED NONE DETECTED   Benzodiazepines NONE DETECTED NONE DETECTED   Amphetamines NONE DETECTED NONE DETECTED   Tetrahydrocannabinol NONE DETECTED NONE DETECTED   Barbiturates NONE DETECTED NONE DETECTED    Comment:        DRUG SCREEN FOR MEDICAL PURPOSES ONLY.  IF CONFIRMATION IS NEEDED FOR ANY PURPOSE, NOTIFY LAB WITHIN 5 DAYS.        LOWEST DETECTABLE  LIMITS FOR URINE DRUG SCREEN Drug Class       Cutoff (ng/mL) Amphetamine      1000 Barbiturate      200 Benzodiazepine   549 Tricyclics       826 Opiates          300 Cocaine          300 THC              50   Urinalysis, Routine w reflex microscopic     Status: Abnormal   Collection Time: 12/04/14 11:39 PM  Result Value Ref Range   Color, Urine YELLOW YELLOW   APPearance CLEAR CLEAR   Specific Gravity, Urine 1.012 1.005 - 1.030   pH 6.0 5.0 - 8.0   Glucose, UA NEGATIVE NEGATIVE mg/dL   Hgb urine dipstick TRACE (A) NEGATIVE   Bilirubin Urine NEGATIVE NEGATIVE   Ketones, ur NEGATIVE NEGATIVE mg/dL   Protein, ur NEGATIVE NEGATIVE mg/dL   Urobilinogen, UA 0.2 0.0 - 1.0 mg/dL   Nitrite NEGATIVE NEGATIVE   Leukocytes, UA NEGATIVE NEGATIVE  Urine microscopic-add on     Status: None   Collection Time: 12/04/14 11:39 PM  Result Value Ref Range   Squamous Epithelial / LPF RARE RARE   RBC / HPF 0-2 <3 RBC/hpf   Bacteria, UA RARE RARE  Glucose, capillary     Status: Abnormal   Collection Time: 12/05/14 12:10 AM  Result Value Ref Range   Glucose-Capillary 47 (L) 70 - 99 mg/dL  Glucose, capillary     Status: Abnormal   Collection Time: 12/05/14 12:45 AM  Result Value Ref Range   Glucose-Capillary 135 (H) 70 - 99 mg/dL   Comment 1 Notify RN    Comment  2 Document in Chart   Ammonia     Status: Abnormal   Collection Time: 12/05/14 12:52 AM  Result Value Ref Range   Ammonia 60 (H) 11 - 32 umol/L  Troponin I     Status: None   Collection Time: 12/05/14  1:45 AM  Result Value Ref Range   Troponin I <0.03 <0.031 ng/mL    Comment:        NO INDICATION OF MYOCARDIAL INJURY.   Lactic acid, plasma     Status: None   Collection Time: 12/05/14  1:45 AM  Result Value Ref Range   Lactic Acid, Venous 1.8 0.5 - 2.0 mmol/L  TSH     Status: None   Collection Time: 12/05/14  1:45 AM  Result Value Ref Range   TSH 0.773 0.350 - 4.500 uIU/mL  Basic metabolic panel     Status: Abnormal   Collection Time: 12/05/14  1:45 AM  Result Value Ref Range   Sodium 140 135 - 145 mmol/L   Potassium 4.9 3.5 - 5.1 mmol/L    Comment: DELTA CHECK NOTED REPEATED TO VERIFY    Chloride 112 96 - 112 mmol/L   CO2 19 19 - 32 mmol/L   Glucose, Bld 88 70 - 99 mg/dL   BUN 22 6 - 23 mg/dL   Creatinine, Ser 1.07 0.50 - 1.10 mg/dL   Calcium 8.1 (L) 8.4 - 10.5 mg/dL   GFR calc non Af Amer 55 (L) >90 mL/min   GFR calc Af Amer 64 (L) >90 mL/min    Comment: (NOTE) The eGFR has been calculated using the CKD EPI equation. This calculation has not been validated in all clinical situations. eGFR's persistently <90 mL/min signify possible Chronic Kidney Disease.  Anion gap 9 5 - 15  CBC     Status: Abnormal   Collection Time: 12/05/14  1:45 AM  Result Value Ref Range   WBC 8.1 4.0 - 10.5 K/uL   RBC 2.83 (L) 3.87 - 5.11 MIL/uL   Hemoglobin 8.8 (L) 12.0 - 15.0 g/dL   HCT 27.0 (L) 36.0 - 46.0 %   MCV 95.4 78.0 - 100.0 fL   MCH 31.1 26.0 - 34.0 pg   MCHC 32.6 30.0 - 36.0 g/dL   RDW 14.6 11.5 - 15.5 %   Platelets 218 150 - 400 K/uL  Glucose, capillary     Status: None   Collection Time: 12/05/14  1:50 AM  Result Value Ref Range   Glucose-Capillary 70 70 - 99 mg/dL   Comment 1 Notify RN    Comment 2 Document in Chart   Procalcitonin     Status: None   Collection  Time: 12/05/14  2:16 AM  Result Value Ref Range   Procalcitonin <0.10 ng/mL    Comment:        Interpretation: PCT (Procalcitonin) <= 0.5 ng/mL: Systemic infection (sepsis) is not likely. Local bacterial infection is possible. (NOTE)         ICU PCT Algorithm               Non ICU PCT Algorithm    ----------------------------     ------------------------------         PCT < 0.25 ng/mL                 PCT < 0.1 ng/mL     Stopping of antibiotics            Stopping of antibiotics       strongly encouraged.               strongly encouraged.    ----------------------------     ------------------------------       PCT level decrease by               PCT < 0.25 ng/mL       >= 80% from peak PCT       OR PCT 0.25 - 0.5 ng/mL          Stopping of antibiotics                                             encouraged.     Stopping of antibiotics           encouraged.    ----------------------------     ------------------------------       PCT level decrease by              PCT >= 0.25 ng/mL       < 80% from peak PCT        AND PCT >= 0.5 ng/mL            Continuin g antibiotics                                              encouraged.       Continuing antibiotics            encouraged.    ----------------------------     ------------------------------  PCT level increase compared          PCT > 0.5 ng/mL         with peak PCT AND          PCT >= 0.5 ng/mL             Escalation of antibiotics                                          strongly encouraged.      Escalation of antibiotics        strongly encouraged.   Glucose, capillary     Status: Abnormal   Collection Time: 12/05/14  3:17 AM  Result Value Ref Range   Glucose-Capillary 40 (LL) 70 - 99 mg/dL   Comment 1 Notify RN    Comment 2 Document in Chart   Glucose, capillary     Status: Abnormal   Collection Time: 12/05/14  3:55 AM  Result Value Ref Range   Glucose-Capillary 117 (H) 70 - 99 mg/dL  MRSA PCR Screening     Status:  None   Collection Time: 12/05/14  4:23 AM  Result Value Ref Range   MRSA by PCR NEGATIVE NEGATIVE    Comment:        The GeneXpert MRSA Assay (FDA approved for NASAL specimens only), is one component of a comprehensive MRSA colonization surveillance program. It is not intended to diagnose MRSA infection nor to guide or monitor treatment for MRSA infections.   Blood gas, arterial     Status: Abnormal   Collection Time: 12/05/14  4:54 AM  Result Value Ref Range   FIO2 0.30 %   Delivery systems VENTILATOR    Mode PRESSURE REGULATED VOLUME CONTROL    VT 440 mL   Rate 14 resp/min   Peep/cpap 5.0 cm H20   pH, Arterial 7.369 7.350 - 7.450   pCO2 arterial 32.8 (L) 35.0 - 45.0 mmHg   pO2, Arterial 136.0 (H) 80.0 - 100.0 mmHg   Bicarbonate 18.3 (L) 20.0 - 24.0 mEq/L   TCO2 17.3 0 - 100 mmol/L   Acid-base deficit 5.7 (H) 0.0 - 2.0 mmol/L   O2 Saturation 98.8 %   Patient temperature 100.0    Collection site RIGHT RADIAL    Drawn by [id number]    Sample type ARTERIAL DRAW    Allens test (pass/fail) PASS PASS  Glucose, capillary     Status: Abnormal   Collection Time: 12/05/14  7:53 AM  Result Value Ref Range   Glucose-Capillary 68 (L) 70 - 99 mg/dL   Comment 1 Notify RN    Comment 2 Document in Chart    Ct Head Wo Contrast  12/04/2014   CLINICAL DATA:  Found unresponsive.  Hypoglycemia  EXAM: CT HEAD WITHOUT CONTRAST  TECHNIQUE: Contiguous axial images were obtained from the base of the skull through the vertex without intravenous contrast.  COMPARISON:  08/01/2012  FINDINGS: There is no intracranial hemorrhage, mass or evidence of acute infarction. There is mild generalized atrophy. There is mild chronic microvascular ischemic change. There is no significant extra-axial fluid collection.  No acute intracranial findings are evident.  There is mild foamy secretions in the left sphenoid sinus.  IMPRESSION: No acute intracranial findings. There is mild generalized atrophy and chronic  microvascular ischemic change.   Electronically Signed   By: Andreas Newport M.D.   On:  12/04/2014 22:22   Dg Chest Port 1 View  12/04/2014   CLINICAL DATA:  Post intubation  EXAM: PORTABLE CHEST - 1 VIEW  COMPARISON:  12/04/2014 at 20:24  FINDINGS: The endotracheal tube is 1.3 cm above the carina. The lungs are clear. There is no pneumothorax. No large effusions. Heart size is normal and unchanged.  IMPRESSION: ET tube is a short distance above the carina.   Electronically Signed   By: Andreas Newport M.D.   On: 12/04/2014 22:12   Dg Chest Port 1 View  12/04/2014   CLINICAL DATA:  Patient is unresponsive, respiratory distress.  EXAM: PORTABLE CHEST - 1 VIEW  COMPARISON:  August 23, 2012, August 01, 2012  FINDINGS: There is prominence opacity of right suprahilar space. The heart size is normal. There is no focal infiltrate, pulmonary edema, or pleural effusion. The visualized skeletal structures are unremarkable.  IMPRESSION: Prominent opacity of the right suprahilar space. Further evaluation with chest CT is recommended to exclude underlying mass.   Electronically Signed   By: Abelardo Diesel M.D.   On: 12/04/2014 21:12   Assessment: 59 y.o. female admitted to the hospital after being found unresponsive at home. Initial work up revealed that CG was 54, Ammonia today 60, and CT brain without acute abnormality. She remains unresponsive off sedatives, but otherwise seems to have no lateralizing neurological signs on exam. Likely metabolic encephalopathy. EEG and MRI brain already requested. Her syndrome is not quite consistent with meningitis and thus will recommend stopping antibiotics unless MRI showed indirect findings to suggest CNS infection. Will continue to follow.  Dorian Pod, MD [facility] [phone] 12/05/2014, 10:29 AM

## 2014-12-05 NOTE — Procedures (Signed)
EEG report.  Brief clinical history: 59 y.o. female admitted to the hospital after being found unresponsive at home. Initial work up revealed that CG was 54, Ammonia today 60, and CT brain without acute abnormality. She remains unresponsive off sedatives, but otherwise seems to have no lateralizing neurological signs on exam.     Technique: this is a 17 channel routine scalp EEG performed at the bedside in the ICU, with bipolar and monopolar montages arranged in accordance to the international 10/20 system of electrode placement. One channel was dedicated to EKG recording.  The patient is off sedatives, unresponsive, intubated on the vent No activating procedures performed.  Description: as the study begins and throughout the entire recording, there is diffuse, continuous, theta activity that doesn't follow an ictal pattern at any time. No focal or generalized epileptiform discharges noted.  EKG showed sinus rhythm.  Impression: this is an abnormal EEG with findings consistent with a non specific mild to moderate encephalopathy. No electrographic seizures noted. Clinical correlation is advised.   Wyatt Portela, MD

## 2014-12-05 NOTE — Progress Notes (Signed)
Hypoglycemic Event  CBG: 62  Treatment: D50 IV 50 mL  Symptoms: None  Follow-up CBG: Time: 0820 CBG Result:159  Possible Reasons for Event: Inadequate meal intake     Gina Cox  Remember to initiate Hypoglycemia Order Set & complete

## 2014-12-05 NOTE — Progress Notes (Signed)
ANTIBIOTIC CONSULT NOTE - INITIAL  Pharmacy Consult for Vancomycin, ceftriaxone, ampicillin, acyclovir Indication: Meningitis  No Known Allergies  Patient Measurements: Height: 5\' 4"  (162.6 cm) Weight: 215 lb 2.7 oz (97.6 kg) IBW/kg (Calculated) : 54.7 Adjusted Body Weight:   Vital Signs: Temp: 99.9 F (37.7 C) (03/22 0400) Temp Source: Core (Comment) (03/22 0400) BP: 139/58 mmHg (03/22 0300) Pulse Rate: 87 (03/22 0300) Intake/Output from previous day: 03/21 0701 - 03/22 0700 In: 573.2 [I.V.:263.2; NG/GT:60; IV Piggyback:250] Out: 970 [Urine:970] Intake/Output from this shift: Total I/O In: 573.2 [I.V.:263.2; NG/GT:60; IV Piggyback:250] Out: 970 [Urine:970]  Labs:  Recent Labs  12/04/14 2028 12/04/14 2031 12/05/14 0145  WBC  --  8.7 8.1  HGB 11.2* 10.2* 8.8*  PLT  --  275 218  CREATININE 1.00 1.14* 1.07   Estimated Creatinine Clearance: 63.5 mL/min (by C-G formula based on Cr of 1.07). No results for input(s): VANCOTROUGH, VANCOPEAK, VANCORANDOM, GENTTROUGH, GENTPEAK, GENTRANDOM, TOBRATROUGH, TOBRAPEAK, TOBRARND, AMIKACINPEAK, AMIKACINTROU, AMIKACIN in the last 72 hours.   Microbiology: No results found for this or any previous visit (from the past 720 hour(s)).  Medical History: Past Medical History  Diagnosis Date  . Diabetes mellitus   . Hypertension   . Hyperlipemia   . Gout     Medications:  Anti-infectives    Start     Dose/Rate Route Frequency Ordered Stop   12/05/14 2000  cefTRIAXone (ROCEPHIN) 2 g in dextrose 5 % 50 mL IVPB  Status:  Discontinued     2 g 100 mL/hr over 30 Minutes Intravenous Every 24 hours 12/04/14 2022 12/05/14 0051   12/05/14 1000  vancomycin (VANCOCIN) IVPB 750 mg/150 ml premix     750 mg 150 mL/hr over 60 Minutes Intravenous Every 12 hours 12/05/14 0105     12/05/14 0800  cefTRIAXone (ROCEPHIN) 2 g in dextrose 5 % 50 mL IVPB - Premix     2 g 100 mL/hr over 30 Minutes Intravenous Every 12 hours 12/05/14 0105     12/05/14  0115  ampicillin (OMNIPEN) 2 g in sodium chloride 0.9 % 50 mL IVPB     2 g 150 mL/hr over 20 Minutes Intravenous 6 times per day 12/05/14 0105     12/05/14 0115  acyclovir (ZOVIRAX) 550 mg in dextrose 5 % 100 mL IVPB     550 mg 111 mL/hr over 60 Minutes Intravenous 3 times per day 12/05/14 0107     12/04/14 2300  vancomycin (VANCOCIN) 2,000 mg in sodium chloride 0.9 % 500 mL IVPB  Status:  Discontinued     2,000 mg 250 mL/hr over 120 Minutes Intravenous  Once 12/04/14 2205 12/05/14 0051   12/04/14 2015  cefTRIAXone (ROCEPHIN) 2 g in dextrose 5 % 50 mL IVPB     2 g 100 mL/hr over 30 Minutes Intravenous  Once 12/04/14 2009 12/04/14 2100     Assessment: Patient already being dosed ceftriaxone by pharmacy for urosepsis.  MD adds vancomycin, ampicillin, acyclovir for meningitis.   Goal of Therapy:  Vancomycin trough level 15-20 mcg/ml  Ceftriaxone, ampicillin, acyclovir dosed based on patient weight, dosing guidelines and renal function   Plan:  Measure antibiotic drug levels at steady state Follow up culture results  Vancomycin 750mg  iv q12hr (after 2gm load) Acyclovir 550mg  iv q8hr Ceftriaxone 2gm iv q12hr Ampicillin 2gm iv q4hr  Darlina Guys, Jacquenette Shone Crowford 12/05/2014,5:21 AM

## 2014-12-05 NOTE — Progress Notes (Signed)
INITIAL NUTRITION ASSESSMENT  DOCUMENTATION CODES Per approved criteria  -Obesity Unspecified   INTERVENTION: -Initiate Vital High Protein via OGT at 20 ml/hr and increase by 10 ml every 4 hours to goal rate of 40 ml/hr.   30 ml Pro-stat BID  Current TF regimen provides 1250 kcal (23 g/kg ideal body weight), 114 g protein and 803 ml free water  NUTRITION DIAGNOSIS: Inadequate oral intake related to inability to eat as evidenced by NPO status.   Goal: Enteral nutrition to provide 60-70% of estimated caloric needs (22-25 g/kg of ideal body weight) and 100% of estimated protein needs per ASPEN guideline of hypo caloric high protein feeding of critically ill obese individuals.   Monitor:  TF initiation/tolerance/adequacy, weight trends, labs, I/Os  Reason for Assessment: New Vent  59 y.o. female  Admitting Dx: <principal problem not specified>  ASSESSMENT: Pt found unresponsive, brought to ED and required intubation.  PMH significant for HTN, DM, polysubstance abuse.    Pt is currently intubated on ventilator support: MVe: 8.0 Temp (24hrs), Avg:100 F (37.8 C), Min:99.7 F (37.6 C), Max:100.6 F (38.1 C)   Propofol: none  No signs of fat or muscle wasting  Spoke with RN, pt having loose stools but feels comfortable advancing feeds every 4 hours, will continue to monitor  Height: Ht Readings from Last 1 Encounters:  12/05/14  (1.626 m)    Weight: Wt Readings from Last 1 Encounters:  12/05/14 215 lb 2.7 oz (97.6 kg)    Ideal Body Weight: 120 lbs (54.5 kg)  % Ideal Body Weight: 179%  Wt Readings from Last 10 Encounters:  12/05/14 215 lb 2.7 oz (97.6 kg)    Usual Body Weight: n/a  % Usual Body Weight: n/a  BMI:  Body mass index is 36.92 kg/(m^2).  Estimated Nutritional Needs: Kcal: 1731 (1210-1376 = 22-25 g/kg ideal body weight) Protein: >/= 110 g protein Fluid: >/= 1.6 L  Skin: WDL  Diet Order:    EDUCATION NEEDS: -No education needs  identified at this time   Intake/Output Summary (Last 24 hours) at 12/05/14 0929 Last data filed at 12/05/14 0900  Gross per 24 hour  Intake  793.2 ml  Output   1205 ml  Net -411.8 ml    Last BM: 3/22   Labs:   Recent Labs Lab 12/04/14 2028 12/04/14 2031 12/05/14 0145  NA 139 138 140  K 5.8* 5.9* 4.9  CL 107 109 112  CO2  --  21 19  BUN 30* 27* 22  CREATININE 1.00 1.14* 1.07  CALCIUM  --  9.1 8.1*  GLUCOSE 92 95 88    CBG (last 3)   Recent Labs  12/05/14 0317 12/05/14 0355 12/05/14 0753  GLUCAP 40* 117* 68*    Scheduled Meds: . acyclovir  550 mg Intravenous 3 times per day  . ampicillin (OMNIPEN) IV  2 g Intravenous 6 times per day  . antiseptic oral rinse  7 mL Mouth Rinse QID  . cefTRIAXone (ROCEPHIN)  IV  2 g Intravenous Q12H  . chlorhexidine  15 mL Mouth Rinse BID  . dextrose  1 ampule Intravenous Once  . famotidine (PEPCID) IV  20 mg Intravenous Q12H  . [START ON 12/06/2014] folic acid  1 mg Intravenous Daily  . heparin  5,000 Units Subcutaneous 3 times per day  . [START ON 12/06/2014] thiamine  100 mg Intravenous Daily  . vancomycin  750 mg Intravenous Q12H    Continuous Infusions: . dextrose 5 % and 0.9%  NaCl 100 mL/hr at 12/05/14 0300    Past Medical History  Diagnosis Date  . Diabetes mellitus   . Hypertension   . Hyperlipemia   . Gout     History reviewed. No pertinent past surgical history.  Magdalen Spatz MS Dietetic Intern Pager Number [phone]

## 2014-12-05 NOTE — Progress Notes (Signed)
Lumbar puncture was attempted at the bedside but unsuccessfully. Will order LP under fluoro.  Wyatt Portela, MD

## 2014-12-05 NOTE — Progress Notes (Signed)
eLink Physician-Brief Progress Note Patient Name: Gina Cox DOB: 14-Apr-1956 MRN: [id number]   Date of Service  12/05/2014  HPI/Events of Note  MRi reviewed, noted eeg neg but MRi could be c/w seziure Also of course meningitis / encpeh Already on BBB coverage meningitis ans acyclovir  eICU Interventions  Will add keppra Call neuro to consider LP     Intervention Category Major Interventions: Change in mental status - evaluation and management  FEINSTEIN,DANIEL J. 12/05/2014, 3:35 PM

## 2014-12-05 NOTE — Progress Notes (Signed)
eLink Physician-Brief Progress Note Patient Name: Gina Cox DOB: 05-25-1956 MRN: [id number]   Date of Service  12/05/2014  HPI/Events of Note  Patient with hyperkalemia K of 5.9.  Plan was to treat with calcium, dextrose/insulin but patient with hypoglycemia.  eICU Interventions  Plan: D/C insulin/dextrose Change IVFs to D5NS at 100 cc/hr Kayexalate 45 gm via tube times one Continue with calcium     Intervention Category Major Interventions: Electrolyte abnormality - evaluation and management  Bethanne Mule 12/05/2014, 12:59 AM

## 2014-12-06 ENCOUNTER — Inpatient Hospital Stay (HOSPITAL_COMMUNITY): Payer: Medicare Other

## 2014-12-06 DIAGNOSIS — G0490 Encephalitis and encephalomyelitis, unspecified: Secondary | ICD-10-CM | POA: Insufficient documentation

## 2014-12-06 LAB — BLOOD GAS, ARTERIAL
ACID-BASE DEFICIT: 1.8 mmol/L (ref 0.0–2.0)
Bicarbonate: 21.2 mEq/L (ref 20.0–24.0)
DRAWN BY: 331471
FIO2: 0.3 %
LHR: 14 {breaths}/min
MECHVT: 440 mL
O2 Saturation: 98.9 %
PCO2 ART: 33.4 mmHg — AB (ref 35.0–45.0)
PEEP: 5 cmH2O
PH ART: 7.42 (ref 7.350–7.450)
PO2 ART: 152 mmHg — AB (ref 80.0–100.0)
Patient temperature: 98.6
TCO2: 17.8 mmol/L (ref 0–100)

## 2014-12-06 LAB — BASIC METABOLIC PANEL
Anion gap: 8 (ref 5–15)
BUN: 23 mg/dL (ref 6–23)
CHLORIDE: 111 mmol/L (ref 96–112)
CO2: 24 mmol/L (ref 19–32)
Calcium: 7.9 mg/dL — ABNORMAL LOW (ref 8.4–10.5)
Creatinine, Ser: 1.02 mg/dL (ref 0.50–1.10)
GFR calc non Af Amer: 59 mL/min — ABNORMAL LOW (ref >90)
GFR, EST AFRICAN AMERICAN: 68 mL/min — AB (ref >90)
Glucose, Bld: 160 mg/dL — ABNORMAL HIGH (ref 70–99)
Potassium: 3.2 mmol/L — ABNORMAL LOW (ref 3.5–5.1)
Sodium: 143 mmol/L (ref 135–145)

## 2014-12-06 LAB — CBC
HCT: 25.3 % — ABNORMAL LOW (ref 36.0–46.0)
HEMOGLOBIN: 8.1 g/dL — AB (ref 12.0–15.0)
MCH: 30.6 pg (ref 26.0–34.0)
MCHC: 32 g/dL (ref 30.0–36.0)
MCV: 95.5 fL (ref 78.0–100.0)
Platelets: 178 10*3/uL (ref 150–400)
RBC: 2.65 MIL/uL — AB (ref 3.87–5.11)
RDW: 14.7 % (ref 11.5–15.5)
WBC: 10.6 10*3/uL — AB (ref 4.0–10.5)

## 2014-12-06 LAB — CSF CELL COUNT WITH DIFFERENTIAL
LYMPHS CSF: 4 % — AB (ref 40–80)
MONOCYTE-MACROPHAGE-SPINAL FLUID: 14 % — AB (ref 15–45)
RBC Count, CSF: 13 /mm3 — ABNORMAL HIGH
SEGMENTED NEUTROPHILS-CSF: 82 % — AB (ref 0–6)
Tube #: 4
WBC, CSF: 128 /mm3 (ref 0–5)

## 2014-12-06 LAB — GLUCOSE, CAPILLARY
GLUCOSE-CAPILLARY: 159 mg/dL — AB (ref 70–99)
GLUCOSE-CAPILLARY: 116 mg/dL — AB (ref 70–99)
GLUCOSE-CAPILLARY: 137 mg/dL — AB (ref 70–99)
GLUCOSE-CAPILLARY: 90 mg/dL (ref 70–99)
GLUCOSE-CAPILLARY: 151 mg/dL — AB (ref 70–99)
Glucose-Capillary: 107 mg/dL — ABNORMAL HIGH (ref 70–99)
Glucose-Capillary: 151 mg/dL — ABNORMAL HIGH (ref 70–99)
Glucose-Capillary: 169 mg/dL — ABNORMAL HIGH (ref 70–99)
Glucose-Capillary: 239 mg/dL — ABNORMAL HIGH (ref 70–99)
Glucose-Capillary: 265 mg/dL — ABNORMAL HIGH (ref 70–99)
Glucose-Capillary: 68 mg/dL — ABNORMAL LOW (ref 70–99)

## 2014-12-06 LAB — PHOSPHORUS: PHOSPHORUS: 3.3 mg/dL (ref 2.3–4.6)

## 2014-12-06 LAB — MAGNESIUM: Magnesium: 1.3 mg/dL — ABNORMAL LOW (ref 1.5–2.5)

## 2014-12-06 LAB — PROTEIN, CSF: Total  Protein, CSF: 42 mg/dL (ref 15–45)

## 2014-12-06 LAB — VANCOMYCIN, TROUGH: VANCOMYCIN TR: 20.9 ug/mL — AB (ref 10.0–20.0)

## 2014-12-06 LAB — GLUCOSE, CSF: Glucose, CSF: 96 mg/dL — ABNORMAL HIGH (ref 43–76)

## 2014-12-06 LAB — FOLATE RBC
FOLATE, HEMOLYSATE: 232.3 ng/mL
FOLATE, RBC: 860 ng/mL (ref >498)
HEMATOCRIT: 27 % — AB (ref 34.0–46.6)

## 2014-12-06 LAB — AMMONIA: Ammonia: 22 umol/L (ref 11–32)

## 2014-12-06 MED ORDER — VANCOMYCIN HCL 500 MG IV SOLR
500.0000 mg | Freq: Two times a day (BID) | INTRAVENOUS | Status: DC
  Administered 2014-12-07: 500 mg via INTRAVENOUS
  Filled 2014-12-06: qty 500

## 2014-12-06 MED ORDER — INSULIN ASPART 100 UNIT/ML ~~LOC~~ SOLN
0.0000 [IU] | SUBCUTANEOUS | Status: DC
  Administered 2014-12-06: 5 [IU] via SUBCUTANEOUS
  Administered 2014-12-06 (×2): 3 [IU] via SUBCUTANEOUS
  Administered 2014-12-06: 5 [IU] via SUBCUTANEOUS
  Administered 2014-12-06: 3 [IU] via SUBCUTANEOUS
  Administered 2014-12-06 – 2014-12-07 (×2): 2 [IU] via SUBCUTANEOUS
  Administered 2014-12-07: 3 [IU] via SUBCUTANEOUS
  Administered 2014-12-07: 5 [IU] via SUBCUTANEOUS
  Administered 2014-12-07 (×2): 2 [IU] via SUBCUTANEOUS
  Administered 2014-12-08: 5 [IU] via SUBCUTANEOUS
  Administered 2014-12-08: 8 [IU] via SUBCUTANEOUS
  Administered 2014-12-08 (×4): 5 [IU] via SUBCUTANEOUS

## 2014-12-06 MED ORDER — POTASSIUM CHLORIDE 20 MEQ/15ML (10%) PO SOLN
40.0000 meq | ORAL | Status: AC
  Administered 2014-12-06 (×2): 40 meq
  Filled 2014-12-06 (×2): qty 30

## 2014-12-06 MED ORDER — SODIUM CHLORIDE 0.9 % IV SOLN
INTRAVENOUS | Status: DC
  Administered 2014-12-06: 02:00:00 via INTRAVENOUS

## 2014-12-06 MED ORDER — MAGNESIUM SULFATE 50 % IJ SOLN
6.0000 g | Freq: Once | INTRAVENOUS | Status: AC
  Administered 2014-12-06: 6 g via INTRAVENOUS
  Filled 2014-12-06: qty 12

## 2014-12-06 MED ORDER — SODIUM CHLORIDE 0.9 % IV SOLN
500.0000 mg | Freq: Two times a day (BID) | INTRAVENOUS | Status: DC
  Administered 2014-12-07: 500 mg via INTRAVENOUS
  Filled 2014-12-06 (×2): qty 5

## 2014-12-06 NOTE — Progress Notes (Signed)
eLink Physician-Brief Progress Note Patient Name: Gina Cox DOB: 1955-11-18 MRN: [id number]   Date of Service  12/06/2014  HPI/Events of Note  Patient now with hyperglycemia on TFs and D5NS.  eICU Interventions  Plan: Place on q 4 hour SSI moderate scale D/C dextrose IV and change to NS at 50 cc/hr Continue to monitor blood sugars     Intervention Category Intermediate Interventions: Hyperglycemia - evaluation and treatment  DETERDING,ELIZABETH 12/06/2014, 1:35 AM

## 2014-12-06 NOTE — Progress Notes (Signed)
eLink Physician-Brief Progress Note Patient Name: Gina Cox DOB: 09-27-1955 MRN: [id number]   Date of Service  12/06/2014  HPI/Events of Note  Best Practice  eICU Interventions  SCDS for DVT propy     Intervention Category Intermediate Interventions: Best-practice therapies (e.g. DVT, beta blocker, etc.)  DETERDING,ELIZABETH 12/06/2014, 5:13 AM

## 2014-12-06 NOTE — Progress Notes (Addendum)
CRITICAL VALUE ALERT  Critical value received:  CSF WBC's 128  Date of notification:  12/06/14  Time of notification:  1245  Critical value read back:Yes.    Nurse who received alert:  Santiago Glad, RN  MD notified (1st page):  Dr. Molli Knock, instructed to notify neuro as well  Time of first page:  1246  MD notified (2nd page): Dr. Leroy Kennedy  Time of second page: 1254  Responding MD:    Time MD responded:

## 2014-12-06 NOTE — Progress Notes (Signed)
Attempts made by 2 RT's to obtain arterial blood for ABG analysis. No success. MD aware.

## 2014-12-06 NOTE — Procedures (Signed)
Central Venous Catheter Insertion Procedure Note Gina Cox [id number] 1956-03-03  Procedure: Insertion of Central Venous Catheter Indications: Assessment of intravascular volume, Drug and/or fluid administration and Frequent blood sampling  Procedure Details Consent: Unable to obtain consent because of altered level of consciousness. Time Out: Verified patient identification, verified procedure, site/side was marked, verified correct patient position, special equipment/implants available, medications/allergies/relevent history reviewed, required imaging and test results available.  Performed  Maximum sterile technique was used including antiseptics, cap, gloves, gown, hand hygiene, mask and sheet. Skin prep: Chlorhexidine; local anesthetic administered A antimicrobial bonded/coated triple lumen catheter was placed in the left internal jugular vein using the Seldinger technique.  Evaluation Blood flow good Complications: No apparent complications Patient did tolerate procedure well. Chest X-ray ordered to verify placement.  CXR: pending.  U/S used in placement.  YACOUB,WESAM 12/06/2014, 12:29 PM

## 2014-12-06 NOTE — Progress Notes (Signed)
NUTRITION FOLLOW UP  Intervention:   Continue Vital High Protein via OGT at 20 ml/hr and increase by 10 ml every 4 hours to goal rate of 40 ml/hr.   30 ml Pro-stat BID  Current TF regimen provides 1250 kcal (23 g/kg ideal body weight), 114 g protein and 803 ml free water  Nutrition Dx:   Inadequate oral intake related to inability to eat as evidenced by NPO status; ongoing.  Goal:   Enteral nutrition to provide 60-70% of estimated caloric needs (22-25 g/kg of ideal body weight) and 100% of estimated protein needs per ASPEN guideline of hypo caloric high protein feeding of critically ill obese individuals; progressing  Monitor:   TF initiation/tolerance/adequacy, weight trends, labs, I/Os  Assessment:   Pt found unresponsive, brought to ED and required intubation. PMH significant for HTN, DM, polysubstance abuse.   Pt is currently intubated on ventilator support: MVe: 8.0 L/min Temp (24hrs), Avg:100 F (37.8 C), Min:99.7 F (37.6 C), Max:100.6 F (38.1 C)   Propofol: none  No signs of fat or muscle wasting  Spoke with RN, pt tolerating TF well, no residuals.   Height: Ht Readings from Last 1 Encounters:  12/05/14 5\' 4"  (1.626 m)    Weight Status:   Wt Readings from Last 1 Encounters:  12/06/14 215 lb 9.8 oz (97.8 kg)    Re-estimated needs:  Kcal: 1731 (1210-1376 = 22-25 g/kg ideal body weight) Protein: >/= 110 g protein Fluid: >/= 1.6 L  Skin: intact  Diet Order:     Intake/Output Summary (Last 24 hours) at 12/06/14 1113 Last data filed at 12/06/14 0600  Gross per 24 hour  Intake 2774.33 ml  Output   1871 ml  Net 903.33 ml    Last BM: 3/22   Labs:   Recent Labs Lab 12/04/14 2028 12/04/14 2031 12/05/14 0145  NA 139 138 140  K 5.8* 5.9* 4.9  CL 107 109 112  CO2  --  21 19  BUN 30* 27* 22  CREATININE 1.00 1.14* 1.07  CALCIUM  --  9.1 8.1*  GLUCOSE 92 95 88    CBG (last 3)   Recent Labs  12/05/14 1948 12/06/14 0044 12/06/14 0753   GLUCAP 128* 239* 90    Scheduled Meds: . acyclovir  550 mg Intravenous 3 times per day  . amLODipine  5 mg Per Tube Daily  . ampicillin (OMNIPEN) IV  2 g Intravenous 6 times per day  . antiseptic oral rinse  7 mL Mouth Rinse QID  . cefTRIAXone (ROCEPHIN)  IV  2 g Intravenous Q12H  . chlorhexidine  15 mL Mouth Rinse BID  . dextrose  1 ampule Intravenous Once  . famotidine (PEPCID) IV  20 mg Intravenous Q12H  . feeding supplement (PRO-STAT SUGAR FREE 64)  30 mL Oral BID  . feeding supplement (VITAL HIGH PROTEIN)  1,000 mL Per Tube Q24H  . folic acid  1 mg Intravenous Daily  . insulin aspart  0-15 Units Subcutaneous 6 times per day  . lactulose  20 g Per Tube TID  . levETIRAcetam  500 mg Intravenous Q12H  . thiamine  100 mg Intravenous Daily  . vancomycin  750 mg Intravenous Q12H    Continuous Infusions:   Magdalen Spatz MS Dietetic Intern Pager Number [phone]

## 2014-12-06 NOTE — Progress Notes (Signed)
PULMONARY / CRITICAL CARE MEDICINE   Name: Gina Cox MRN: [id number] DOB: June 06, 1956    ADMISSION DATE:  12/04/2014 CONSULTATION DATE:  12/04/2014  REFERRING MD :  Ralene Bathe EDP  CHIEF COMPLAINT:  Found down  INITIAL PRESENTATION: 59 year old F brought to the [facility] ED on 12/04/2014 after being found down at home by daughter.  Last seen normal 1 day prior. Required intubation in the emergency department for airway protection.  PCCM called to admit.  NOTE:  PER EDP, PT WAS A DIFFICULT AIRWAY!!!  STUDIES:  3/21 CT head >>> no acute intracranial process  SIGNIFICANT EVENTS: 3/21 - admit  HISTORY OF PRESENT ILLNESS:  This is a 59 year old female who has a past medical history significant for hypertension, diabetes, and polysubstance abuse who was brought to the [facility] ED 3/21 after being found down unresponsive at home by her daughter.  She was last seen normal 1 day prior around 1PM.  Daughter states that this has happened before whenever pt does not eat and then goes to sleep.  Apparently this has been occuring more over the past 4 - 5 days.  When daughter went to visit her one day earlier, pt was on the ground but refused that she be brought to ED for evaluation as she was adamant that nothing was wrong with her.  Pt asked daughter to take her to the grocery store and per daughter, pt acted her normal self while shopping and on return back home.  When she left, pt was in her USOH.  On 3/21 when daughter got off work, she returned to pt's house and this is when she found her down and unresponsive.  From her description, it seems that pt was agonal at the time with very shallow respirations as well as "gurgling / snoring" per daughter. On EMS arrival, initial CG was 54 (improved to 197 after 25g D50).  Daughter reports that pt drinks alcohol and does do drugs (marijuana and crack cocaine is what she knows of).  Last UDS in 2013 positive for cocaine.  PAST MEDICAL HISTORY :   has a past medical history  of Diabetes mellitus; Hypertension; Hyperlipemia; and Gout.  has no past surgical history on file. Prior to Admission medications   Medication Sig Start Date End Date Taking? Authorizing Provider  amLODipine (NORVASC) 5 MG tablet Take 5 mg by mouth daily.   Yes Historical Provider, MD  glimepiride (AMARYL) 2 MG tablet Take 4 mg by mouth daily.     Historical Provider, MD  ibuprofen (ADVIL,MOTRIN) 600 MG tablet Take 600 mg by mouth every 6 (six) hours as needed. Swelling, pain     Historical Provider, MD  insulin glargine (LANTUS) 100 UNIT/ML injection Inject 30 Units into the skin at bedtime.     Historical Provider, MD  lisinopril (PRINIVIL,ZESTRIL) 40 MG tablet Take 40 mg by mouth daily.    Historical Provider, MD  naproxen (NAPROSYN) 500 MG tablet Take 500 mg by mouth 2 (two) times daily as needed. For pain      Historical Provider, MD  nebivolol (BYSTOLIC) 5 MG tablet Take 5 mg by mouth daily.    Historical Provider, MD  pioglitazone-metformin (ACTOPLUS MET) 15-500 MG per tablet Take 2 tablets by mouth daily.     Historical Provider, MD  simvastatin (ZOCOR) 20 MG tablet Take 20 mg by mouth daily.    Historical Provider, MD   No Known Allergies  FAMILY HISTORY:  has no family status information on file.  SOCIAL HISTORY:  reports that she has been smoking Cigarettes.  She has been smoking about 2.00 packs per day. She does not have any smokeless tobacco history on file. She reports that she drinks alcohol. She reports that she uses illicit drugs (Cocaine and Marijuana).  REVIEW OF SYSTEMS:  Cannot obtain secondary to acute encephalopathy  SUBJECTIVE:   VITAL SIGNS: Temp:  [99.5 F (37.5 C)-101.3 F (38.5 C)] 99.5 F (37.5 C) (03/23 0600) Pulse Rate:  [81-118] 81 (03/23 0600) Resp:  [12-31] 12 (03/23 0600) BP: (97-177)/(41-90) 158/60 mmHg (03/23 0600) SpO2:  [98 %-100 %] 99 % (03/23 0600) FiO2 (%):  [30 %-100 %] 30 % (03/23 0807) Weight:  [97.523 kg (215 lb)-97.8 kg (215 lb 9.8  oz)] 97.8 kg (215 lb 9.8 oz) (03/23 0500) HEMODYNAMICS:   VENTILATOR SETTINGS: Vent Mode:  [-] PRVC FiO2 (%):  [30 %-100 %] 30 % Set Rate:  [14 bmp] 14 bmp Vt Set:  [440 mL] 440 mL PEEP:  [5 cmH20] 5 cmH20 Plateau Pressure:  [10 cmH20-18 cmH20] 13 cmH20 INTAKE / OUTPUT:  Intake/Output Summary (Last 24 hours) at 12/06/14 1023 Last data filed at 12/06/14 0600  Gross per 24 hour  Intake 2884.33 ml  Output   1946 ml  Net 938.33 ml   PHYSICAL EXAMINATION: General:  AA female, appears older than stated age, in NAD. Neuro: Not sedated on vent.  Unresponsive, does not withdraw to pain today. HEENT: Rote/AT. PERRL, sclerae anicteric. Cardiovascular: RRR, no M/R/G.  Lungs: Respirations even and unlabored.  CTA bilaterally, No W/R/R Abdomen: Oese, BS hypoactive, soft, NT/ND.  Musculoskeletal: No gross deformities, no edema.  Skin: Intact, warm, no rashes.  LABS:  CBC  Recent Labs Lab 12/04/14 2028 12/04/14 2031 12/05/14 0145  WBC  --  8.7 8.1  HGB 11.2* 10.2* 8.8*  HCT 33.0* 31.2* 27.0*  PLT  --  275 218   Coag's  Recent Labs Lab 12/04/14 2031  INR 0.97   BMET  Recent Labs Lab 12/04/14 2028 12/04/14 2031 12/05/14 0145  NA 139 138 140  K 5.8* 5.9* 4.9  CL 107 109 112  CO2  --  21 19  BUN 30* 27* 22  CREATININE 1.00 1.14* 1.07  GLUCOSE 92 95 88   Electrolytes  Recent Labs Lab 12/04/14 2031 12/05/14 0145  CALCIUM 9.1 8.1*   Sepsis Markers  Recent Labs Lab 12/04/14 2039 12/05/14 0145 12/05/14 0216  LATICACIDVEN 1.34 1.8  --   PROCALCITON  --   --  <0.10   ABG  Recent Labs Lab 12/04/14 2200 12/05/14 0454 12/06/14 0730  PHART 7.428 7.369 7.420  PCO2ART 28.7* 32.8* 33.4*  PO2ART 445.0* 136.0* 152.0*   Liver Enzymes  Recent Labs Lab 12/04/14 2031  AST 25  ALT 13  ALKPHOS 73  BILITOT 0.4  ALBUMIN 3.8   Cardiac Enzymes  Recent Labs Lab 12/05/14 0145  TROPONINI <0.03   Glucose  Recent Labs Lab 12/05/14 1221 12/05/14 1509  12/05/14 1616 12/05/14 1948 12/06/14 0044 12/06/14 0753  GLUCAP 107* 68* 116* 128* 239* 90   Imaging Dg Abd 1 View  12/05/2014   CLINICAL DATA:  Check gastric catheter placement  EXAM: ABDOMEN - 1 VIEW  COMPARISON:  None.  FINDINGS: A gastric catheter is noted within the stomach with the tip directed towards the pyloric channel. Scattered large and small bowel gas is noted. No other focal abnormality is seen.  IMPRESSION: Gastric catheter within the distal stomach.   Electronically Signed   By: Inez Catalina  M.D.   On: 12/05/2014 11:33   Mr Jeri Cos FG Contrast  12/05/2014   CLINICAL DATA:  Unresponsive.  On ventilator.  Low-grade fever.  EXAM: MRI HEAD WITHOUT AND WITH CONTRAST  TECHNIQUE: Multiplanar, multiecho pulse sequences of the brain and surrounding structures were obtained without and with intravenous contrast.  CONTRAST:  78m MULTIHANCE GADOBENATE DIMEGLUMINE 529 MG/ML IV SOLN  COMPARISON:  CT head 12/04/2014  FINDINGS: Image quality degraded by mild motion.  Diffusion-weighted signal is present in the hippocampus bilaterally involving the body and tail of the hippocampus with some extension into the posterior cingulate gyrus. These areas also show T2 hyperintensity but do not show abnormal enhancement. This is symmetric. No significant masslike enlargement of the structures.  Ventricle size is normal.  No shift of the midline structures.  Negative for hemorrhage.  No mass lesion  Normal enhancement following contrast infusion. Note the patient began to move during the contrast enhanced images.  Mucosal edema in the paranasal sinuses with small air-fluid levels in the maxillary sinus bilaterally.  IMPRESSION: Symmetric signal abnormality on diffusion in the body and tail of the hippocampus extending into the posterior cingulate gyrus. No significant abnormal enhancement. This pattern is nonspecific but could be seen with limbic encephalitis.  Differential diagnosis includes herpes encephalitis,  paraneoplastic encephalitis, and seizure related signal abnormality.  These results were called by telephone at the time of interpretation on 12/05/2014 at 3:34 pm to Dr. DNadara Mode, who verbally acknowledged these results.   Electronically Signed   By: CFranchot GalloM.D.   On: 12/05/2014 15:35   ASSESSMENT / PLAN:  NEUROLOGIC A:   Acute metabolic encephalopathy ETOH use  Polysubstance abuse MRI with bilateral hippocampal enhancement  P:   Sedation: D/C all sedation. RASS goal: 0 to -1. Daily WUA. Thiamine / Folate. UDS negative. Ammonia 60, RPR, HIV negative, TSH 0.773. Start lactulose. Neurology consult appreciated. EEG noted. MRI as above. In IR to have LP done. F/U on CSF results.  PULMONARY OETT 3/21 >>> A:  Acute respiratory failure secondary to inability to protect airway Tobacco use disorder DIFFICULT AIRWAY PER EDP - very anterior with small airway P:   VAP bundle. Begin PS trials but no extubation given mental status. CXR and ABG in AM. Tobacco cessation counseling if improves. Soft wrist restraints in attempt to decrease chance of self extubation. Will need to discuss early trach given neuro situation.  CARDIOVASCULAR A: Hx HTN, HLD, cocaine abuse P:  Monitor hemodynamics. Repeat troponin. Hold outpatient lisinopril, nebivolol, simvastatin. Continue amlodipine for BP control.  RENAL A:  CKD Hyperkalemia P:   KVO IVF. BMP in AM. Replace electrolytes as indicated.  GASTROINTESTINAL A:  GI prophylaxis Nutrition P:   SUP:  Famotidine. TF per nutrition.  HEMATOLOGIC A:  VTE prophylaxis P:  Heparin. CBC in AM.  INFECTIOUS A:   Consider meningitis - exam not impressive for meningitis, but will consider given degree of AMS  P:   BCx2 3/21 > UC 3/21 > Abx: Vanc, start date 3/21>>>  Abx: Ceftriaxone, start date 3/21>>> Abx: Ampicillin, start date 3/21>>> Abx: Acyclovir, start date, 3/21>>> PCT <10 making infection very unlikely but  given MRI findings will wait until LP is done prior to changing anything.  ENDOCRINE A:  DM P:   CBG's q4hrs. SSI. Hold outpatient glimepiride, lantus, pioglitazone-metformin.  FAMILY  - Updates:  No family bedside.  - Inter-disciplinary family meet or Palliative Care meeting due by:  12/10/14.  The patient is critically  ill with multiple organ systems failure and requires high complexity decision making for assessment and support, frequent evaluation and titration of therapies, application of advanced monitoring technologies and extensive interpretation of multiple databases.   Critical Care Time devoted to patient care services described in this note is  35  Minutes. This time reflects time of care of this signee Dr Jennet Maduro. This critical care time does not reflect procedure time, or teaching time or supervisory time of PA/NP/Med student/Med Resident etc but could involve care discussion time.  Rush Farmer, M.D. [facility] Pulmonary/Critical Care Medicine. Pager: [phone]. After hours pager: [phone].  12/06/2014, 10:23 AM

## 2014-12-06 NOTE — Progress Notes (Signed)
eLink Physician-Brief Progress Note Patient Name: Gina Cox DOB: 1956/03/07 MRN: [id number]   Date of Service  12/06/2014  HPI/Events of Note  k low, ,ag low  eICU Interventions  Suppk, mag     Intervention Category Intermediate Interventions: Infection - evaluation and management;Electrolyte abnormality - evaluation and management  Nelda Bucks. 12/06/2014, 4:37 PM

## 2014-12-06 NOTE — Progress Notes (Addendum)
NEURO HOSPITALIST PROGRESS NOTE   SUBJECTIVE:                                                                                                                        Neurologically unchanged. Attempted LP at the bedside yesterday was unsuccessful and thus scheduled to be done under fluoro this morning. EEG without evidence of electrographic seizures but most consistent with mild to moderate encephalopathy. MRI brain with and without contrast was personally reviewed and showed no evidence of stroke but non specif symmetric signal abnormality on diffusion images involving the body and tail of the hippocampus extending into the posterior cingulate gyrus. No significant abnormal enhancement.  She is on ceftriaxone, vancomycin, ampicillin, and acyclovir. Also on lactulose. Serologies are pending at this moment. Febrile.  OBJECTIVE:                                                                                                                           Vital signs in last 24 hours: Temp:  [99.5 F (37.5 C)-101.3 F (38.5 C)] 99.5 F (37.5 C) (03/23 0600) Pulse Rate:  [81-118] 81 (03/23 0600) Resp:  [12-31] 12 (03/23 0600) BP: (97-177)/(41-90) 158/60 mmHg (03/23 0600) SpO2:  [98 %-100 %] 99 % (03/23 0600) FiO2 (%):  [30 %-100 %] 30 % (03/23 0807) Weight:  [97.523 kg (215 lb)-97.8 kg (215 lb 9.8 oz)] 97.8 kg (215 lb 9.8 oz) (03/23 0500)  Intake/Output from previous day: 03/22 0701 - 03/23 0700 In: 3784.3 [I.V.:1635; ON/GE:952.8; IV Piggyback:1300] Out: 2081 [Urine:1770; Stool:311] Intake/Output this shift:   Nutritional status:    Past Medical History  Diagnosis Date  . Diabetes mellitus   . Hypertension   . Hyperlipemia   . Gout    Physical exam: acutely ill, intubated on the vent. Head: normocephalic. Neck: supple, no bruits, no JVD. Cardiac: no murmurs. Lungs: clear. Abdomen: soft, no tender, no mass. Extremities: mild LE edema. Skin:  no rash Neurologic Exam:  Patient is off sedatives, intubated on the vent. Mental status: does not open eyes to painful stimuli CN 2-12: pupils 4 mm bilaterally reactive, no gaze preference, no nystagmus. Corneal responses present, face symmetric, tongue: intubated. Motor: moves all limbs symmetrically upon painful stimuli. Sensory: reacts to  pain. Plantars: right upgoing, left downgoing. Coordination and gait: unable to test. No meningeal signs.  Lab Results: Lab Results  Component Value Date/Time   CHOL 205* 10/26/2011 07:01 PM   Lipid Panel No results for input(s): CHOL, TRIG, HDL, CHOLHDL, VLDL, LDLCALC in the last 72 hours.  Studies/Results: Dg Abd 1 View  12/05/2014   CLINICAL DATA:  Check gastric catheter placement  EXAM: ABDOMEN - 1 VIEW  COMPARISON:  None.  FINDINGS: A gastric catheter is noted within the stomach with the tip directed towards the pyloric channel. Scattered large and small bowel gas is noted. No other focal abnormality is seen.  IMPRESSION: Gastric catheter within the distal stomach.   Electronically Signed   By: Alcide Clever M.D.   On: 12/05/2014 11:33   Ct Head Wo Contrast  12/04/2014   CLINICAL DATA:  Found unresponsive.  Hypoglycemia  EXAM: CT HEAD WITHOUT CONTRAST  TECHNIQUE: Contiguous axial images were obtained from the base of the skull through the vertex without intravenous contrast.  COMPARISON:  08/01/2012  FINDINGS: There is no intracranial hemorrhage, mass or evidence of acute infarction. There is mild generalized atrophy. There is mild chronic microvascular ischemic change. There is no significant extra-axial fluid collection.  No acute intracranial findings are evident.  There is mild foamy secretions in the left sphenoid sinus.  IMPRESSION: No acute intracranial findings. There is mild generalized atrophy and chronic microvascular ischemic change.   Electronically Signed   By: Ellery Plunk M.D.   On: 12/04/2014 22:22   Mr Laqueta Jean WU  Contrast  12/05/2014   CLINICAL DATA:  Unresponsive.  On ventilator.  Low-grade fever.  EXAM: MRI HEAD WITHOUT AND WITH CONTRAST  TECHNIQUE: Multiplanar, multiecho pulse sequences of the brain and surrounding structures were obtained without and with intravenous contrast.  CONTRAST:  20mL MULTIHANCE GADOBENATE DIMEGLUMINE 529 MG/ML IV SOLN  COMPARISON:  CT head 12/04/2014  FINDINGS: Image quality degraded by mild motion.  Diffusion-weighted signal is present in the hippocampus bilaterally involving the body and tail of the hippocampus with some extension into the posterior cingulate gyrus. These areas also show T2 hyperintensity but do not show abnormal enhancement. This is symmetric. No significant masslike enlargement of the structures.  Ventricle size is normal.  No shift of the midline structures.  Negative for hemorrhage.  No mass lesion  Normal enhancement following contrast infusion. Note the patient began to move during the contrast enhanced images.  Mucosal edema in the paranasal sinuses with small air-fluid levels in the maxillary sinus bilaterally.  IMPRESSION: Symmetric signal abnormality on diffusion in the body and tail of the hippocampus extending into the posterior cingulate gyrus. No significant abnormal enhancement. This pattern is nonspecific but could be seen with limbic encephalitis.  Differential diagnosis includes herpes encephalitis, paraneoplastic encephalitis, and seizure related signal abnormality.  These results were called by telephone at the time of interpretation on 12/05/2014 at 3:34 pm to Dr. Micah Flesher , who verbally acknowledged these results.   Electronically Signed   By: Marlan Palau M.D.   On: 12/05/2014 15:35   Dg Chest Port 1 View  12/06/2014   CLINICAL DATA:  Intubation.  EXAM: PORTABLE CHEST - 1 VIEW  COMPARISON:  12/04/2014.  FINDINGS: Endotracheal tube and NG tube in good anatomic position. Heart size normal. No focal pulmonary infiltrate. No pleural effusion or  pneumothorax. No acute osseous abnormality.  IMPRESSION: 1. Lines and tubes in stable position. 2. No acute cardiopulmonary disease identified. Chest is stable from  prior exam.   Electronically Signed   By: Maisie Fus  Register   On: 12/06/2014 07:25   Dg Chest Port 1 View  12/04/2014   CLINICAL DATA:  Post intubation  EXAM: PORTABLE CHEST - 1 VIEW  COMPARISON:  12/04/2014 at 20:24  FINDINGS: The endotracheal tube is 1.3 cm above the carina. The lungs are clear. There is no pneumothorax. No large effusions. Heart size is normal and unchanged.  IMPRESSION: ET tube is a short distance above the carina.   Electronically Signed   By: Ellery Plunk M.D.   On: 12/04/2014 22:12   Dg Chest Port 1 View  12/04/2014   CLINICAL DATA:  Patient is unresponsive, respiratory distress.  EXAM: PORTABLE CHEST - 1 VIEW  COMPARISON:  August 23, 2012, August 01, 2012  FINDINGS: There is prominence opacity of right suprahilar space. The heart size is normal. There is no focal infiltrate, pulmonary edema, or pleural effusion. The visualized skeletal structures are unremarkable.  IMPRESSION: Prominent opacity of the right suprahilar space. Further evaluation with chest CT is recommended to exclude underlying mass.   Electronically Signed   By: Sherian Rein M.D.   On: 12/04/2014 21:12    MEDICATIONS                                                                                                                        Scheduled: . acyclovir  550 mg Intravenous 3 times per day  . amLODipine  5 mg Per Tube Daily  . ampicillin (OMNIPEN) IV  2 g Intravenous 6 times per day  . antiseptic oral rinse  7 mL Mouth Rinse QID  . cefTRIAXone (ROCEPHIN)  IV  2 g Intravenous Q12H  . chlorhexidine  15 mL Mouth Rinse BID  . dextrose  1 ampule Intravenous Once  . famotidine (PEPCID) IV  20 mg Intravenous Q12H  . feeding supplement (PRO-STAT SUGAR FREE 64)  30 mL Oral BID  . feeding supplement (VITAL HIGH PROTEIN)  1,000 mL Per Tube  Q24H  . folic acid  1 mg Intravenous Daily  . insulin aspart  0-15 Units Subcutaneous 6 times per day  . lactulose  20 g Per Tube TID  . levETIRAcetam  500 mg Intravenous Q12H  . thiamine  100 mg Intravenous Daily  . vancomycin  750 mg Intravenous Q12H    ASSESSMENT/PLAN:                                                                                                           58  y.o. female admitted to the hospital after being found unresponsive at home. Initial work up revealed that CG was 54, Ammonia today 60, and CT brain without acute abnormality. MRI brain 3/22 revealed signal abnormality on diffusion images involving the body and tail of the hippocampus extending into the posterior cingulate gyrus. No significant abnormal enhancement. Overall, such findings could represent post ictal changes, hypoglycemic encephalopathy, limbic encephalitis. EEG without electrographic seizures but a pattern most consistent with encephalopathy. She remains unresponsive off sedatives, but otherwise seems to have no lateralizing neurological signs on exam. and I doubt an infectious cause. Although I am not quite convinced this is an infectious CNS process, will suggest maintaining current antibiotics pending CSF results. Further, may consider pursuing c-EEG monitoring. Will continue to follow with you.  Wyatt Portela, MD [facility] [phone]  12/06/2014, 9:48 AM

## 2014-12-06 NOTE — Procedures (Signed)
   Informed consent was obtained from the patient prior to the procedure, including potential complications of headache, allergy, and pain.  With the patient prone, the lower back was prepped with Betadine.  1% Lidocaine was used for local anesthesia.  Lumbar puncture was performed at the [L2-L3] level using a [22] gauge needle with return of [clear] CSF with an opening pressure of [12] cm water.  [8.5] ml of CSF were obtained for laboratory studies.  The patient tolerated the procedure well and there were no apparent complications.

## 2014-12-06 NOTE — Progress Notes (Signed)
eLink Physician-Brief Progress Note Patient Name: LILYAH DOWNEN DOB: 02/23/1956 MRN: [id number]   Date of Service  12/06/2014  HPI/Events of Note  LP wbc present, reviewed  eICU Interventions  Has been on abx prior to LP Keep current therapy     Intervention Category Intermediate Interventions: Infection - evaluation and management  Nelda Bucks. 12/06/2014, 4:38 PM

## 2014-12-06 NOTE — Progress Notes (Signed)
ANTIBIOTIC CONSULT NOTE - INITIAL  Pharmacy Consult for Vancomycin, ceftriaxone, ampicillin, acyclovir Indication: Meningitis  No Known Allergies  Patient Measurements: Height:  (162.6 cm) Weight: 215 lb 9.8 oz (97.8 kg) IBW/kg (Calculated) : 54.7 Adjusted Body Weight:   Vital Signs: Temp: 98.3 F (36.8 C) (03/23 1200) Temp Source: Oral (03/23 1200) BP: 149/82 mmHg (03/23 1300) Pulse Rate: 86 (03/23 1300) Intake/Output from previous day: 03/22 0701 - 03/23 0700 In: 3784.3 [I.V.:1635; NG/GT:849.3; IV Piggyback:1300] Out: 2081 [Urine:1770; Stool:311] Intake/Output from this shift: Total I/O In: 620 [I.V.:70; NG/GT:200; IV Piggyback:350] Out: 850 [Urine:600; Stool:250]  Labs:  Recent Labs  12/04/14 2031 12/05/14 0145 12/06/14 1500  WBC 8.7 8.1 10.6*  HGB 10.2* 8.8* 8.1*  PLT 275 218 178  CREATININE 1.14* 1.07 1.02   Estimated Creatinine Clearance: 66.6 mL/min (by C-G formula based on Cr of 1.02). No results for input(s): VANCOTROUGH, VANCOPEAK, VANCORANDOM, GENTTROUGH, GENTPEAK, GENTRANDOM, TOBRATROUGH, TOBRAPEAK, TOBRARND, AMIKACINPEAK, AMIKACINTROU, AMIKACIN in the last 72 hours.   Microbiology: Recent Results (from the past 720 hour(s))  Blood Culture (routine x 2)     Status: None (Preliminary result)   Collection Time: 12/04/14  8:10 PM  Result Value Ref Range Status   Specimen Description BLOOD RIGHT WRIST  Final   Special Requests BOTTLES DRAWN AEROBIC AND ANAEROBIC 5CC EACH  Final   Culture   Final           BLOOD CULTURE RECEIVED NO GROWTH TO DATE CULTURE WILL BE HELD FOR 5 DAYS BEFORE ISSUING A FINAL NEGATIVE REPORT Performed at [facility]    Report Status PENDING  Incomplete  Blood Culture (routine x 2)     Status: None (Preliminary result)   Collection Time: 12/04/14  8:11 PM  Result Value Ref Range Status   Specimen Description BLOOD LEFT ARM  Final   Special Requests BOTTLES DRAWN AEROBIC AND ANAEROBIC 5CC EACH  Final   Culture    Final           BLOOD CULTURE RECEIVED NO GROWTH TO DATE CULTURE WILL BE HELD FOR 5 DAYS BEFORE ISSUING A FINAL NEGATIVE REPORT Performed at [facility]    Report Status PENDING  Incomplete  Urine culture     Status: None   Collection Time: 12/04/14  9:31 PM  Result Value Ref Range Status   Specimen Description URINE, CLEAN CATCH  Final   Special Requests NONE  Final   Colony Count   Final    >=100,000 COLONIES/ML Performed at [facility]    Culture   Final    Multiple bacterial morphotypes present, none predominant. Suggest appropriate recollection if clinically indicated. Performed at [facility]    Report Status 12/05/2014 FINAL  Final  MRSA PCR Screening     Status: None   Collection Time: 12/05/14  4:23 AM  Result Value Ref Range Status   MRSA by PCR NEGATIVE NEGATIVE Final    Comment:        The GeneXpert MRSA Assay (FDA approved for NASAL specimens only), is one component of a comprehensive MRSA colonization surveillance program. It is not intended to diagnose MRSA infection nor to guide or monitor treatment for MRSA infections.     Medical History: Past Medical History  Diagnosis Date  . Diabetes mellitus   . Hypertension   . Hyperlipemia   . Gout     Medications:  Anti-infectives    Start     Dose/Rate Route Frequency Ordered Stop  12/05/14 2000  cefTRIAXone (ROCEPHIN) 2 g in dextrose 5 % 50 mL IVPB  Status:  Discontinued     2 g 100 mL/hr over 30 Minutes Intravenous Every 24 hours 12/04/14 2022 12/05/14 0051   12/05/14 1000  vancomycin (VANCOCIN) IVPB 750 mg/150 ml premix     750 mg 150 mL/hr over 60 Minutes Intravenous Every 12 hours 12/05/14 0105     12/05/14 0800  cefTRIAXone (ROCEPHIN) 2 g in dextrose 5 % 50 mL IVPB - Premix     2 g 100 mL/hr over 30 Minutes Intravenous Every 12 hours 12/05/14 0105     12/05/14 0115  ampicillin (OMNIPEN) 2 g in sodium chloride 0.9 % 50 mL IVPB     2 g 150 mL/hr over 20 Minutes  Intravenous 6 times per day 12/05/14 0105     12/05/14 0115  acyclovir (ZOVIRAX) 550 mg in dextrose 5 % 100 mL IVPB     550 mg 111 mL/hr over 60 Minutes Intravenous 3 times per day 12/05/14 0107     12/04/14 2300  vancomycin (VANCOCIN) 2,000 mg in sodium chloride 0.9 % 500 mL IVPB  Status:  Discontinued     2,000 mg 250 mL/hr over 120 Minutes Intravenous  Once 12/04/14 2205 12/05/14 0051   12/04/14 2015  cefTRIAXone (ROCEPHIN) 2 g in dextrose 5 % 50 mL IVPB     2 g 100 mL/hr over 30 Minutes Intravenous  Once 12/04/14 2009 12/04/14 2100     Assessment: 58 y/oF with PMH of DM, HTN, HLD who presents with hypoglycemia and AMS after being found unresponsive at home.  Patient arrived incontinent of urine with strong foul odor noted. UA shows large leukocytes, many bacteria. Pharmacy consulted to assist with dosing of Ceftriaxone for urosepsis.  MD added vancomycin, ampicillin, acyclovir for meningitis coverage on 3/21 in PM.  MRI inconclusive, could represent post ictal changes, hypoglycemic encephalopathy, limbic encephalitis.  EEG consistent with encephalopathy.  LP performed 3/23  3/21 >> Ceftriaxone >> 3/21 >> vanc >> 3/22 >> ampicillin >> 3/22 >> acyclovir >>  Tmax: ~101 yesterday, now afebrile WBCs: sl elevated Renal: SCr 1.07 (3/22), CrCl 64 ml/min, 62N PCT < 0.1  3/21 blood x 2: sent 3/21 urine: >100k col/ml, none predominant 3/23 CSF: high gluc, nml protein, high WBC; non-cloudy 3/22 HIV/RPR both negative 3/22 VDRL CSF: IP  Drug level / dose changes info: 3/23 2100 VT = xxx on 750 mg IV q12   Goal of Therapy:  Vancomycin trough level 15-20 mcg/ml  Ceftriaxone, ampicillin, acyclovir dosed based on patient weight, dosing guidelines and renal function  Plan:   Continue all antibiotics as currently ordered  Will check vancomycin trough tonight before 5th dose at 10p  Follow clinical course, culture results as available, renal function  Follow for de-escalation of  antibiotics and LOT  Bernadene Person, PharmD Pager: [phone] 12/06/2014, 3:54 PM

## 2014-12-06 NOTE — Progress Notes (Signed)
Brief Pharmacy Progress Note:  See progress note by Bernadene Person, PharmD, at 3:53PM for full details. In brief, this is a 49 y/oF being treated with Vancomycin, Ceftriaxone, Ampicillin, and Acyclovir for possible meningitis.   Vancomycin trough level @ 2133: 20.9 mcg/mL-slightly supratherapeutic  Vancomycin trough goal: 15-20 mcg/mL  SCr stable at 1.02, CrCl ~ 67 mL/min   Plan:  Decrease Vancomycin dose to 500mg  IV q12h.  Repeat Vancomycin trough level at new state, as indicated.  Monitor renal function, cultures, clinical course.   Greer Pickerel, PharmD, BCPS Pager: [phone] 12/06/2014 10:31 PM

## 2014-12-07 ENCOUNTER — Inpatient Hospital Stay (HOSPITAL_COMMUNITY): Payer: Medicare Other

## 2014-12-07 LAB — CBC
HCT: 24.4 % — ABNORMAL LOW (ref 36.0–46.0)
Hemoglobin: 7.9 g/dL — ABNORMAL LOW (ref 12.0–15.0)
MCH: 30.9 pg (ref 26.0–34.0)
MCHC: 32.4 g/dL (ref 30.0–36.0)
MCV: 95.3 fL (ref 78.0–100.0)
Platelets: 167 10*3/uL (ref 150–400)
RBC: 2.56 MIL/uL — ABNORMAL LOW (ref 3.87–5.11)
RDW: 14.8 % (ref 11.5–15.5)
WBC: 10.4 10*3/uL (ref 4.0–10.5)

## 2014-12-07 LAB — BLOOD GAS, ARTERIAL
Acid-base deficit: 3.5 mmol/L — ABNORMAL HIGH (ref 0.0–2.0)
Bicarbonate: 20.2 mEq/L (ref 20.0–24.0)
DRAWN BY: 11249
FIO2: 0.3 %
LHR: 14 {breaths}/min
MECHVT: 440 mL
O2 Saturation: 99.1 %
PCO2 ART: 32.9 mmHg — AB (ref 35.0–45.0)
PEEP: 5 cmH2O
PH ART: 7.405 (ref 7.350–7.450)
PO2 ART: 145 mmHg — AB (ref 80.0–100.0)
Patient temperature: 99
TCO2: 19.3 mmol/L (ref 0–100)

## 2014-12-07 LAB — PHOSPHORUS: PHOSPHORUS: 3.3 mg/dL (ref 2.3–4.6)

## 2014-12-07 LAB — BASIC METABOLIC PANEL
Anion gap: 11 (ref 5–15)
Anion gap: 12 (ref 5–15)
BUN: 32 mg/dL — AB (ref 6–23)
BUN: 43 mg/dL — AB (ref 6–23)
CALCIUM: 8.2 mg/dL — AB (ref 8.4–10.5)
CHLORIDE: 114 mmol/L — AB (ref 96–112)
CO2: 21 mmol/L (ref 19–32)
CO2: 21 mmol/L (ref 19–32)
CREATININE: 1.52 mg/dL — AB (ref 0.50–1.10)
CREATININE: 2.58 mg/dL — AB (ref 0.50–1.10)
Calcium: 8.5 mg/dL (ref 8.4–10.5)
Chloride: 115 mmol/L — ABNORMAL HIGH (ref 96–112)
GFR calc Af Amer: 42 mL/min — ABNORMAL LOW (ref >90)
GFR calc Af Amer: 22 mL/min — ABNORMAL LOW (ref >90)
GFR calc non Af Amer: 36 mL/min — ABNORMAL LOW (ref >90)
GFR calc non Af Amer: 19 mL/min — ABNORMAL LOW (ref >90)
Glucose, Bld: 150 mg/dL — ABNORMAL HIGH (ref 70–99)
Glucose, Bld: 208 mg/dL — ABNORMAL HIGH (ref 70–99)
Potassium: 3.5 mmol/L (ref 3.5–5.1)
Potassium: 3.3 mmol/L — ABNORMAL LOW (ref 3.5–5.1)
Sodium: 147 mmol/L — ABNORMAL HIGH (ref 135–145)
Sodium: 147 mmol/L — ABNORMAL HIGH (ref 135–145)

## 2014-12-07 LAB — GLUCOSE, CAPILLARY
GLUCOSE-CAPILLARY: 139 mg/dL — AB (ref 70–99)
GLUCOSE-CAPILLARY: 143 mg/dL — AB (ref 70–99)
GLUCOSE-CAPILLARY: 140 mg/dL — AB (ref 70–99)
GLUCOSE-CAPILLARY: 161 mg/dL — AB (ref 70–99)
Glucose-Capillary: 230 mg/dL — ABNORMAL HIGH (ref 70–99)
Glucose-Capillary: 203 mg/dL — ABNORMAL HIGH (ref 70–99)

## 2014-12-07 LAB — VANCOMYCIN, RANDOM: VANCOMYCIN RM: 37.4 ug/mL

## 2014-12-07 LAB — CLOSTRIDIUM DIFFICILE BY PCR: Toxigenic C. Difficile by PCR: NEGATIVE

## 2014-12-07 LAB — VDRL, CSF: SYPHILIS VDRL QUANT CSF: NONREACTIVE

## 2014-12-07 LAB — MAGNESIUM: Magnesium: 3.1 mg/dL — ABNORMAL HIGH (ref 1.5–2.5)

## 2014-12-07 LAB — AMMONIA: Ammonia: 33 umol/L — ABNORMAL HIGH (ref 11–32)

## 2014-12-07 MED ORDER — METOPROLOL TARTRATE 25 MG/10 ML ORAL SUSPENSION
12.5000 mg | Freq: Two times a day (BID) | ORAL | Status: DC
  Administered 2014-12-07 – 2014-12-09 (×6): 12.5 mg
  Filled 2014-12-07 (×8): qty 5

## 2014-12-07 MED ORDER — AMLODIPINE BESYLATE 10 MG PO TABS
10.0000 mg | ORAL_TABLET | Freq: Every day | ORAL | Status: DC
  Administered 2014-12-08 – 2014-12-27 (×20): 10 mg
  Filled 2014-12-07 (×20): qty 1

## 2014-12-07 MED ORDER — FREE WATER
250.0000 mL | Freq: Four times a day (QID) | Status: DC
  Administered 2014-12-07 – 2014-12-14 (×27): 250 mL

## 2014-12-07 MED ORDER — DEXAMETHASONE SODIUM PHOSPHATE 10 MG/ML IJ SOLN
10.0000 mg | Freq: Four times a day (QID) | INTRAMUSCULAR | Status: AC
  Administered 2014-12-07 – 2014-12-09 (×9): 10 mg via INTRAVENOUS
  Filled 2014-12-07 (×9): qty 1

## 2014-12-07 MED ORDER — SODIUM CHLORIDE 0.9 % IV SOLN
2.0000 g | Freq: Three times a day (TID) | INTRAVENOUS | Status: DC
  Administered 2014-12-08 – 2014-12-09 (×4): 2 g via INTRAVENOUS
  Filled 2014-12-07 (×5): qty 2000

## 2014-12-07 MED ORDER — DEXTROSE 5 % IV SOLN
550.0000 mg | INTRAVENOUS | Status: DC
  Administered 2014-12-08: 550 mg via INTRAVENOUS
  Filled 2014-12-07 (×2): qty 11

## 2014-12-07 MED ORDER — SODIUM CHLORIDE 0.9 % IV SOLN
2.0000 g | Freq: Four times a day (QID) | INTRAVENOUS | Status: DC
  Administered 2014-12-07 (×2): 2 g via INTRAVENOUS
  Filled 2014-12-07 (×3): qty 2000

## 2014-12-07 MED ORDER — DEXTROSE 5 % IV SOLN
550.0000 mg | Freq: Two times a day (BID) | INTRAVENOUS | Status: DC
  Administered 2014-12-07: 550 mg via INTRAVENOUS
  Filled 2014-12-07 (×2): qty 11

## 2014-12-07 NOTE — Progress Notes (Signed)
PULMONARY / CRITICAL CARE MEDICINE   Name: Gina Cox MRN: [id number] DOB: [birth year]    ADMISSION DATE:  12/04/2014 CONSULTATION DATE:  12/04/2014  REFERRING MD :  Madilyn Hook EDP  CHIEF COMPLAINT:  Found down  INITIAL PRESENTATION: 59 year old F brought to the [facility] ED on 12/04/2014 after being found down at home by daughter.  Last seen normal 1 day prior. Required intubation in the emergency department for airway protection.  PCCM called to admit.  NOTE:  PER EDP, PT WAS A DIFFICULT AIRWAY!!!  STUDIES:  3/21 CT head >>> no acute intracranial process  SIGNIFICANT EVENTS: 3/21 - admit  HISTORY OF PRESENT ILLNESS:  This is a 59 year old female who has a past medical history significant for hypertension, diabetes, and polysubstance abuse who was brought to the [facility] ED 3/21 after being found down unresponsive at home by her daughter.  She was last seen normal 1 day prior around 1PM.  Daughter states that this has happened before whenever pt does not eat and then goes to sleep.  Apparently this has been occuring more over the past 4 - 5 days.  When daughter went to visit her one day earlier, pt was on the ground but refused that she be brought to ED for evaluation as she was adamant that nothing was wrong with her.  Pt asked daughter to take her to the grocery store and per daughter, pt acted her normal self while shopping and on return back home.  When she left, pt was in her USOH.  On 3/21 when daughter got off work, she returned to pt's house and this is when she found her down and unresponsive.  From her description, it seems that pt was agonal at the time with very shallow respirations as well as "gurgling / snoring" per daughter. On EMS arrival, initial CG was 54 (improved to 197 after 25g D50).  Daughter reports that pt drinks alcohol and does do drugs (marijuana and crack cocaine is what she knows of).  Last UDS in 2013 positive for cocaine.  SUBJECTIVE: Opens eyes to pain but remains  completely unresponsive and not following commands  VITAL SIGNS: Temp:  [98.3 F (36.8 C)-99.7 F (37.6 C)] 99 F (37.2 C) (03/24 0400) Pulse Rate:  [77-114] 91 (03/24 0857) Resp:  [12-22] 16 (03/24 0857) BP: (133-186)/(52-131) 173/64 mmHg (03/24 0857) SpO2:  [98 %-100 %] 98 % (03/24 0857) FiO2 (%):  [30 %] 30 % (03/24 0857) Weight:  [97.5 kg (214 lb 15.2 oz)] 97.5 kg (214 lb 15.2 oz) (03/24 0400) HEMODYNAMICS:   VENTILATOR SETTINGS: Vent Mode:  [-] PSV FiO2 (%):  [30 %] 30 % Set Rate:  [14 bmp] 14 bmp Vt Set:  [440 mL] 440 mL PEEP:  [5 cmH20] 5 cmH20 Pressure Support:  [10 cmH20] 10 cmH20 Plateau Pressure:  [11 cmH20-14 cmH20] 11 cmH20 INTAKE / OUTPUT:  Intake/Output Summary (Last 24 hours) at 12/07/14 1025 Last data filed at 12/07/14 1478  Gross per 24 hour  Intake   2620 ml  Output   1290 ml  Net   1330 ml   PHYSICAL EXAMINATION: General:  AA female, appears older than stated age, in NAD. Neuro: Not sedated on vent.  Unresponsive, does not withdraw to pain but opens eyes. HEENT: [place]/AT. PERRL, sclerae anicteric. Cardiovascular: RRR, no M/R/G.  Lungs: Respirations even and unlabored.  CTA bilaterally, No W/R/R Abdomen: Oese, BS hypoactive, soft, NT/ND.  Musculoskeletal: No gross deformities, no edema.  Skin: Intact, warm, no rashes.  LABS:  CBC  Recent Labs Lab 12/05/14 0145 12/05/14 0700 12/06/14 1500 12/07/14 0525  WBC 8.1  --  10.6* 10.4  HGB 8.8*  --  8.1* 7.9*  HCT 27.0* 27.0* 25.3* 24.4*  PLT 218  --  178 167   Coag's  Recent Labs Lab 12/04/14 2031  INR 0.97   BMET  Recent Labs Lab 12/05/14 0145 12/06/14 1500 12/07/14 0525  NA 140 143 147*  K 4.9 3.2* 3.5  CL 112 111 115*  CO2 19 24 21   BUN 22 23 32*  CREATININE 1.07 1.02 1.52*  GLUCOSE 88 160* 150*   Electrolytes  Recent Labs Lab 12/05/14 0145 12/06/14 1500 12/07/14 0525  CALCIUM 8.1* 7.9* 8.2*  MG  --  1.3* 3.1*  PHOS  --  3.3 3.3   Sepsis Markers  Recent Labs Lab  12/04/14 2039 12/05/14 0145 12/05/14 0216  LATICACIDVEN 1.34 1.8  --   PROCALCITON  --   --  <0.10   ABG  Recent Labs Lab 12/05/14 0454 12/06/14 0730 12/07/14 0500  PHART 7.369 7.420 7.405  PCO2ART 32.8* 33.4* 32.9*  PO2ART 136.0* 152.0* 145.0*   Liver Enzymes  Recent Labs Lab 12/04/14 2031  AST 25  ALT 13  ALKPHOS 73  BILITOT 0.4  ALBUMIN 3.8   Cardiac Enzymes  Recent Labs Lab 12/05/14 0145  TROPONINI <0.03   Glucose  Recent Labs Lab 12/06/14 1129 12/06/14 1604 12/06/14 1940 12/06/14 2319 12/07/14 0402 12/07/14 0810  GLUCAP 169* 151* 151* 230* 139* 143*   Imaging Dg Chest Port 1 View  12/06/2014   CLINICAL DATA:  Central line placement. Central line in surgeons site infection. Initial encounter. Patient Comments: Hx ETT CVC placement Central line insertion site infection, initial encounter; History: N/A; Study comments: N/A  EXAM: PORTABLE CHEST - 1 VIEW  COMPARISON:  12/06/2014, 0502 hours.  FINDINGS: Support apparatus: There is a new uncomplicated LEFT IJ central line with the tip in the mid SVC, just inferior to the carina. Enteric tube is unchanged. Endotracheal tube tip position unchanged, 27 mm from the carina. Enteric tube present. Monitoring leads project over the chest.  Cardiomediastinal Silhouette:  Unchanged and within normal limits.  Lungs: No airspace disease.  No effusion.  No pneumothorax.  Effusions:  None.  Other:  None.  IMPRESSION: 1. Uncomplicated interval placement of LEFT IJ central line with the tip in the mid SVC. 2. Other support apparatus within normal limits. Endotracheal tube tip 27 mm from the carina. 3. No visible cardiopulmonary disease.   Electronically Signed   By: Andreas Newport M.D.   On: 12/06/2014 13:37   Dg Chest Port 1 View  12/06/2014   CLINICAL DATA:  Intubation.  EXAM: PORTABLE CHEST - 1 VIEW  COMPARISON:  12/04/2014.  FINDINGS: Endotracheal tube and NG tube in good anatomic position. Heart size normal. No focal  pulmonary infiltrate. No pleural effusion or pneumothorax. No acute osseous abnormality.  IMPRESSION: 1. Lines and tubes in stable position. 2. No acute cardiopulmonary disease identified. Chest is stable from prior exam.   Electronically Signed   By: Maisie Fus  Register   On: 12/06/2014 07:25   Dg Fluoro Guide Lumbar Puncture  12/06/2014   CLINICAL DATA:  Altered mental status, patient unresponsive/obtunded.  EXAM: DIAGNOSTIC LUMBAR PUNCTURE UNDER FLUOROSCOPIC GUIDANCE  FLUOROSCOPY TIME:  Radiation Exposure Index (as provided by the fluoroscopic device):  If the device does not provide the exposure index:  Fluoroscopy Time (in minutes and seconds):  1 minutes 57 seconds  Number of Acquired Images:  1  PROCEDURE: Informed consent was obtained from the patient prior to the procedure, including potential complications of headache, allergy, and pain. With the patient prone, the lower back was prepped with Betadine. 1% Lidocaine was used for local anesthesia. Lumbar puncture was performed at the L2-L3 level using a 22 gauge needle with return of clear CSF with an opening pressure of 12 cm water. 8.5 ml of CSF were obtained for laboratory studies. The patient tolerated the procedure well and there were no apparent complications.  IMPRESSION: Successful lumbar puncture without complication   Electronically Signed   By: Genevive Bi M.D.   On: 12/06/2014 12:22   ASSESSMENT / PLAN:  NEUROLOGIC A:   Acute metabolic encephalopathy ETOH use  Polysubstance abuse MRI with bilateral hippocampal enhancement  LP with 128 WBC with 82% PMNs P:   Sedation: D/C all sedation. RASS goal: 0 to -1 but patient remains completely unresponsive on her own. Thiamine / Folate. UDS negative. Ammonia down to 33 with lactulose, RPR ngative, HIV negative, TSH 0.773. Continue lactulose. Neurology consult appreciated. Will call ID. EEG noted. MRI as above.  PULMONARY OETT 3/21 >>> A:  Acute respiratory failure secondary  to inability to protect airway Tobacco use disorder DIFFICULT AIRWAY PER EDP - very anterior with small airway P:   VAP bundle. Continue PS trials but no extubation given mental status. CXR and ABG in AM. Tobacco cessation counseling if improves. Soft wrist restraints in attempt to decrease chance of self extubation. Will need to discuss early trach given neuro situation but no family bedside, evidently asked the nurse some questions but could not wait while MD was in a procedure and left.  CARDIOVASCULAR A: Hx HTN, HLD, cocaine abuse P:  Monitor hemodynamics. Hold outpatient lisinopril, nebivolol, simvastatin. Increase amlodipine to 10 for BP control. Start low dose lopressor 12.5 mg PO BID.  RENAL A:  CKD Hyperkalemia P:   KVO IVF. BMP in AM. Replace electrolytes as indicated.  GASTROINTESTINAL A:  GI prophylaxis Nutrition P:   SUP:  Famotidine. TF per nutrition.  HEMATOLOGIC A:  VTE prophylaxis P:  Heparin. CBC in AM.  INFECTIOUS A:   Consider meningitis - exam not impressive for meningitis, but will consider given degree of AMS  P:   BCx2 3/21 > UC 3/21 > Abx: Vanc, start date 3/21>>>  Abx: Ceftriaxone, start date 3/21>>> Abx: Ampicillin, start date 3/21>>> Abx: Acyclovir, start date, 3/21>>> PCT <0.10 making infection very unlikely but spoke with ID and they recommended continued treatment and they will see patient on consultation.  ENDOCRINE A:  DM P:   CBG's q4hrs. SSI. Hold outpatient glimepiride, lantus, pioglitazone-metformin.  FAMILY  - Updates: No family bedside.  - Inter-disciplinary family meet or Palliative Care meeting due by:  12/10/14.  The patient is critically ill with multiple organ systems failure and requires high complexity decision making for assessment and support, frequent evaluation and titration of therapies, application of advanced monitoring technologies and extensive interpretation of multiple databases.   Critical  Care Time devoted to patient care services described in this note is  35  Minutes. This time reflects time of care of this signee Dr Koren Bound. This critical care time does not reflect procedure time, or teaching time or supervisory time of PA/NP/Med student/Med Resident etc but could involve care discussion time.  Alyson Reedy, M.D. [facility] Pulmonary/Critical Care Medicine. Pager: [phone]. After hours pager: [phone].  12/07/2014, 10:25 AM

## 2014-12-07 NOTE — Progress Notes (Signed)
Pharmacy - Vancomycin dosing  Assessment: 58 y.oF with PMH of DM, HTN, HLD who presents with hypoglycemia and AMS after being found unresponsive at home. Currently on abx for suspected meningitis. Scr increased noticeably from 1.02 to 1.52 today, held vancomycin order and checking VT/SCr   SCr continues to rise, now 2.58 up from 1.52 this AM; CrCl 26.3 ml/min  Drug level / dose changes info: 3/23 2100 VT = 20.9 on 750 mg IV q12, changed to 500 mg q12h 3/24 2100 VT = 37.4 on 500 q12   Goal of Therapy:  Vancomycin trough level 15-20 mcg/ml Eradication of infection Appropriate dosing of antibiotics for renal function and indication  Plan:   Continue to hold vancomycin.  Will check random vanc level tomorrow evening and reassess clearance at that time.  Clinical pharmacist may adjust trough time as appropriate based on tomorrow morning's SCr.  Reduce ampicillin to 2g IV q8h  Reduce acyclovir to 550 mg IV q24h   No renal adjustment needed for ceftriaxone  Maintain adequate hydration with acyclovir, as drug may crystallize in urine, leading to tubular damage  Bernadene Person, PharmD Pager: [phone] 12/07/2014, 10:08 PM

## 2014-12-07 NOTE — Progress Notes (Signed)
Pharmacy - Dexamethasone for meningitis  Assessment: 13 y/oF with PMH of DM, HTN, HLD who presents with hypoglycemia and AMS after being found unresponsive at home.  Currently treating with empiric meningitis coverage to include HSV and listeria.  ID would like to start adjunctive dexamethasone - acknowledges benefit this late after antibiotics is unclear  3/21 >> Ceftriaxone >> 3/21 >> vanc >> 3/22 >> ampicillin >> 3/22 >> acyclovir >>  Tmax: ~101 yesterday, now afebrile WBCs: 10.4 Renal: SCr up 1.52, CrCl 64 ml/min, 62N, UOP down 0.4 PCT < 0.1  3/21 blood x 2: NGTD 3/21 urine: >100k col/ml, none predominant 3/23 CSF: high gluc, nml protein, high WBC; non-cloudy 3/22 HIV/RPR both negative 3/22 VDRL CSF: IP  3/23 LP: glucose 96 (slightly high), protein 42, wbc high 128, neutrophils high 82   Plan: I am opting not to use the IDSA-recommended full dose of 0.15 mg/kg in this larger patient that would result in 60 mg dexamethasone daily, especially given the late start  (additionally, the single contributory study leading to recommendation to use dexamethasone in adults used a flat dose of dexamethasone 10 mg IV q6h)   Dexamethasone 10 mg IV q6h to continue through 3/26 (9 doses total)  Follow CBGs, clinical course

## 2014-12-07 NOTE — Progress Notes (Signed)
ANTIBIOTIC CONSULT NOTE - FOLLOW UP  Pharmacy Consult for acyclovir, rocephin, vancomycin, ampicillin Indication: suspected meningitis  No Known Allergies  Patient Measurements: Height:  (162.6 cm) Weight: 214 lb 15.2 oz (97.5 kg) IBW/kg (Calculated) : 54.7  Vital Signs: Temp: 99 F (37.2 C) (03/24 0400) Temp Source: Axillary (03/24 0400) BP: 172/70 mmHg (03/24 1230) Pulse Rate: 107 (03/24 1230) Intake/Output from previous day: 03/23 0701 - 03/24 0700 In: 2780 [I.V.:210; NG/GT:1400; IV Piggyback:1170] Out: 2015 [Urine:940; Stool:1075] Intake/Output from this shift: Total I/O In: 530 [NG/GT:280; IV Piggyback:250] Out: -   Labs:  Recent Labs  12/05/14 0145 12/06/14 1500 12/07/14 0525  WBC 8.1 10.6* 10.4  HGB 8.8* 8.1* 7.9*  PLT 218 178 167  CREATININE 1.07 1.02 1.52*   Estimated Creatinine Clearance: 44.6 mL/min (by C-G formula based on Cr of 1.52).  Recent Labs  12/06/14 2133  VANCOTROUGH 20.9*     Microbiology: Recent Results (from the past 720 hour(s))  Blood Culture (routine x 2)     Status: None (Preliminary result)   Collection Time: 12/04/14  8:10 PM  Result Value Ref Range Status   Specimen Description BLOOD RIGHT WRIST  Final   Special Requests BOTTLES DRAWN AEROBIC AND ANAEROBIC 5CC EACH  Final   Culture   Final           BLOOD CULTURE RECEIVED NO GROWTH TO DATE CULTURE WILL BE HELD FOR 5 DAYS BEFORE ISSUING A FINAL NEGATIVE REPORT Performed at [facility]    Report Status PENDING  Incomplete  Blood Culture (routine x 2)     Status: None (Preliminary result)   Collection Time: 12/04/14  8:11 PM  Result Value Ref Range Status   Specimen Description BLOOD LEFT ARM  Final   Special Requests BOTTLES DRAWN AEROBIC AND ANAEROBIC 5CC EACH  Final   Culture   Final           BLOOD CULTURE RECEIVED NO GROWTH TO DATE CULTURE WILL BE HELD FOR 5 DAYS BEFORE ISSUING A FINAL NEGATIVE REPORT Performed at [facility]    Report Status  PENDING  Incomplete  Urine culture     Status: None   Collection Time: 12/04/14  9:31 PM  Result Value Ref Range Status   Specimen Description URINE, CLEAN CATCH  Final   Special Requests NONE  Final   Colony Count   Final    >=100,000 COLONIES/ML Performed at [facility]    Culture   Final    Multiple bacterial morphotypes present, none predominant. Suggest appropriate recollection if clinically indicated. Performed at [facility]    Report Status 12/05/2014 FINAL  Final  MRSA PCR Screening     Status: None   Collection Time: 12/05/14  4:23 AM  Result Value Ref Range Status   MRSA by PCR NEGATIVE NEGATIVE Final    Comment:        The GeneXpert MRSA Assay (FDA approved for NASAL specimens only), is one component of a comprehensive MRSA colonization surveillance program. It is not intended to diagnose MRSA infection nor to guide or monitor treatment for MRSA infections.     Anti-infectives    Start     Dose/Rate Route Frequency Ordered Stop   12/07/14 1000  vancomycin (VANCOCIN) 500 mg in sodium chloride 0.9 % 100 mL IVPB     500 mg 100 mL/hr over 60 Minutes Intravenous Every 12 hours 12/06/14 2227     12/05/14 2000  cefTRIAXone (ROCEPHIN) 2 g  in dextrose 5 % 50 mL IVPB  Status:  Discontinued     2 g 100 mL/hr over 30 Minutes Intravenous Every 24 hours 12/04/14 2022 12/05/14 0051   12/05/14 1000  vancomycin (VANCOCIN) IVPB 750 mg/150 ml premix     750 mg 150 mL/hr over 60 Minutes Intravenous Every 12 hours 12/05/14 0105 12/06/14 2329   12/05/14 0800  cefTRIAXone (ROCEPHIN) 2 g in dextrose 5 % 50 mL IVPB - Premix     2 g 100 mL/hr over 30 Minutes Intravenous Every 12 hours 12/05/14 0105     12/05/14 0115  ampicillin (OMNIPEN) 2 g in sodium chloride 0.9 % 50 mL IVPB     2 g 150 mL/hr over 20 Minutes Intravenous 6 times per day 12/05/14 0105     12/05/14 0115  acyclovir (ZOVIRAX) 550 mg in dextrose 5 % 100 mL IVPB     550 mg 111 mL/hr over 60 Minutes  Intravenous 3 times per day 12/05/14 0107     12/04/14 2300  vancomycin (VANCOCIN) 2,000 mg in sodium chloride 0.9 % 500 mL IVPB  Status:  Discontinued     2,000 mg 250 mL/hr over 120 Minutes Intravenous  Once 12/04/14 2205 12/05/14 0051   12/04/14 2015  cefTRIAXone (ROCEPHIN) 2 g in dextrose 5 % 50 mL IVPB     2 g 100 mL/hr over 30 Minutes Intravenous  Once 12/04/14 2009 12/04/14 2100      Assessment: 58 y.oF with PMH of DM, HTN, HLD who presents with hypoglycemia and AMS after being found unresponsive at home.  He's currently on abx for suspected meningitis.  Scr increased noticeably from 1.02 to 1.52 today.  3/21 >> Ceftriaxone >> 3/21 >> vanc >> 3/22 >> ampicillin >> 3/22 >> acyclovir >>  Tmax: ~101 yesterday, now afebrile WBCs: 10.4 Renal: SCr up 1.52, CrCl 64 ml/min, 62N, UOP down 0.4 PCT < 0.1  3/21 blood x 2: sent 3/21 urine: >100k col/ml, none predominant 3/23 CSF: high gluc, nml protein, high WBC; non-cloudy 3/22 HIV/RPR both negative 3/22 VDRL CSF: IP  3/23 LP: glucose 96 (slightly high), protein 42, wbc high 128, neutrophils high 82  Drug level / dose changes info: 3/23 2100 VT = 20.9 on 750 mg IV q12, changed to 500 mg q12h   Goal of Therapy:  Vancomycin trough level 15-20 mcg/ml  Plan:  - decrease ampicillin to 2gm IV q6h - decrease acyclovir to  550 IV q12h - continue ceftriaxone 2gm IV q12h - with noticeable jump in scr, will check random vancomycin trough level tonight to r/o accumulation. Will d/c standing vancomycin order for now and resume if/when appropriate based on level -Bmet tonight with vancomycin level to assess clearance - f/u renal funct - F/u recommendations from ID    Olivia Pavelko P 12/07/2014,12:45 PM

## 2014-12-07 NOTE — Progress Notes (Addendum)
NEURO HOSPITALIST PROGRESS NOTE   SUBJECTIVE:                                                                                                                        Intubated on the vent, off sedatives, open her eyes to verbal commands. CSF: colorless, wbc 128 with 82% neutrophils, rbc 13, normal protein and glucose 96. HSV pending. She is on ceftriaxone, vancomycin, ampicillin, and acyclovir.  Improving ammonia 33, on lactulose.  OBJECTIVE:                                                                                                                           Vital signs in last 24 hours: Temp:  [98.7 F (37.1 C)-99.7 F (37.6 C)] 99 F (37.2 C) (03/24 0400) Pulse Rate:  [77-114] 107 (03/24 1230) Resp:  [12-22] 16 (03/24 1230) BP: (133-186)/(52-131) 172/70 mmHg (03/24 1230) SpO2:  [98 %-100 %] 100 % (03/24 1230) FiO2 (%):  [30 %] 30 % (03/24 1230) Weight:  [97.5 kg (214 lb 15.2 oz)] 97.5 kg (214 lb 15.2 oz) (03/24 0400)  Intake/Output from previous day: 03/23 0701 - 03/24 0700 In: 2780 [I.V.:210; NG/GT:1400; IV Piggyback:1170] Out: 2015 [Urine:940; Stool:1075] Intake/Output this shift: Total I/O In: 990 [NG/GT:690; IV Piggyback:300] Out: -  Nutritional status:    Past Medical History  Diagnosis Date  . Diabetes mellitus   . Hypertension   . Hyperlipemia   . Gout   Physical exam: acutely ill, intubated on the vent. Head: normocephalic. Neck: supple, no bruits, no JVD. Cardiac: no murmurs. Lungs: clear. Abdomen: soft, no tender, no mass. Extremities: mild LE edema. Skin: no rash  Neurologic Exam:  Patient is off sedatives, intubated on the vent. Mental status: open eyes to painful stimuli CN 2-12: pupils 4 mm bilaterally reactive, no gaze preference, no nystagmus. Corneal responses present, face symmetric, tongue: intubated. Motor: moves all limbs upon painful stimuli. Sensory: reacts to pain. Plantars: right upgoing, left  downgoing. Coordination and gait: unable to test. No meningeal signs.  Lab Results: Lab Results  Component Value Date/Time   CHOL 205* 10/26/2011 07:01 PM   Lipid Panel No results for input(s): CHOL, TRIG, HDL, CHOLHDL, VLDL, LDLCALC in the last 72 hours.  Studies/Results:  Mr Laqueta Jean ZO Contrast  12/05/2014   CLINICAL DATA:  Unresponsive.  On ventilator.  Low-grade fever.  EXAM: MRI HEAD WITHOUT AND WITH CONTRAST  TECHNIQUE: Multiplanar, multiecho pulse sequences of the brain and surrounding structures were obtained without and with intravenous contrast.  CONTRAST:  20mL MULTIHANCE GADOBENATE DIMEGLUMINE 529 MG/ML IV SOLN  COMPARISON:  CT head 12/04/2014  FINDINGS: Image quality degraded by mild motion.  Diffusion-weighted signal is present in the hippocampus bilaterally involving the body and tail of the hippocampus with some extension into the posterior cingulate gyrus. These areas also show T2 hyperintensity but do not show abnormal enhancement. This is symmetric. No significant masslike enlargement of the structures.  Ventricle size is normal.  No shift of the midline structures.  Negative for hemorrhage.  No mass lesion  Normal enhancement following contrast infusion. Note the patient began to move during the contrast enhanced images.  Mucosal edema in the paranasal sinuses with small air-fluid levels in the maxillary sinus bilaterally.  IMPRESSION: Symmetric signal abnormality on diffusion in the body and tail of the hippocampus extending into the posterior cingulate gyrus. No significant abnormal enhancement. This pattern is nonspecific but could be seen with limbic encephalitis.  Differential diagnosis includes herpes encephalitis, paraneoplastic encephalitis, and seizure related signal abnormality.  These results were called by telephone at the time of interpretation on 12/05/2014 at 3:34 pm to Dr. Micah Flesher , who verbally acknowledged these results.   Electronically Signed   By: Marlan Palau M.D.   On: 12/05/2014 15:35   Dg Chest Port 1 View  12/07/2014   CLINICAL DATA:  Hypoxia  EXAM: PORTABLE CHEST - 1 VIEW  COMPARISON:  December 06, 2014  FINDINGS: Endotracheal tube tip is 3.3 cm above the carina. Nasogastric tube tip and side port are below the diaphragm. Central catheter tip is in the superior vena cava near the cavoatrial junction. No pneumothorax. Lungs are clear. The heart size and pulmonary vascularity are normal. No adenopathy. No bone lesions.  IMPRESSION: Tube and catheter positions as described without pneumothorax. No edema or consolidation.   Electronically Signed   By: Bretta Bang III M.D.   On: 12/07/2014 07:13   Dg Chest Port 1 View  12/06/2014   CLINICAL DATA:  Central line placement. Central line in surgeons site infection. Initial encounter. Patient Comments: Hx ETT CVC placement Central line insertion site infection, initial encounter; History: N/A; Study comments: N/A  EXAM: PORTABLE CHEST - 1 VIEW  COMPARISON:  12/06/2014, 0502 hours.  FINDINGS: Support apparatus: There is a new uncomplicated LEFT IJ central line with the tip in the mid SVC, just inferior to the carina. Enteric tube is unchanged. Endotracheal tube tip position unchanged, 27 mm from the carina. Enteric tube present. Monitoring leads project over the chest.  Cardiomediastinal Silhouette:  Unchanged and within normal limits.  Lungs: No airspace disease.  No effusion.  No pneumothorax.  Effusions:  None.  Other:  None.  IMPRESSION: 1. Uncomplicated interval placement of LEFT IJ central line with the tip in the mid SVC. 2. Other support apparatus within normal limits. Endotracheal tube tip 27 mm from the carina. 3. No visible cardiopulmonary disease.   Electronically Signed   By: Andreas Newport M.D.   On: 12/06/2014 13:37   Dg Chest Port 1 View  12/06/2014   CLINICAL DATA:  Intubation.  EXAM: PORTABLE CHEST - 1 VIEW  COMPARISON:  12/04/2014.  FINDINGS: Endotracheal tube and NG tube in good anatomic  position. Heart  size normal. No focal pulmonary infiltrate. No pleural effusion or pneumothorax. No acute osseous abnormality.  IMPRESSION: 1. Lines and tubes in stable position. 2. No acute cardiopulmonary disease identified. Chest is stable from prior exam.   Electronically Signed   By: Maisie Fus  Register   On: 12/06/2014 07:25   Dg Fluoro Guide Lumbar Puncture  12/06/2014   CLINICAL DATA:  Altered mental status, patient unresponsive/obtunded.  EXAM: DIAGNOSTIC LUMBAR PUNCTURE UNDER FLUOROSCOPIC GUIDANCE  FLUOROSCOPY TIME:  Radiation Exposure Index (as provided by the fluoroscopic device):  If the device does not provide the exposure index:  Fluoroscopy Time (in minutes and seconds):  1 minutes 57 seconds  Number of Acquired Images:  1  PROCEDURE: Informed consent was obtained from the patient prior to the procedure, including potential complications of headache, allergy, and pain. With the patient prone, the lower back was prepped with Betadine. 1% Lidocaine was used for local anesthesia. Lumbar puncture was performed at the L2-L3 level using a 22 gauge needle with return of clear CSF with an opening pressure of 12 cm water. 8.5 ml of CSF were obtained for laboratory studies. The patient tolerated the procedure well and there were no apparent complications.  IMPRESSION: Successful lumbar puncture without complication   Electronically Signed   By: Genevive Bi M.D.   On: 12/06/2014 12:22    MEDICATIONS                                                                                                                        Scheduled: . acyclovir  550 mg Intravenous Q12H  . [START ON 12/08/2014] amLODipine  10 mg Per Tube Daily  . ampicillin (OMNIPEN) IV  2 g Intravenous Q6H  . antiseptic oral rinse  7 mL Mouth Rinse QID  . cefTRIAXone (ROCEPHIN)  IV  2 g Intravenous Q12H  . chlorhexidine  15 mL Mouth Rinse BID  . dextrose  1 ampule Intravenous Once  . famotidine (PEPCID) IV  20 mg Intravenous Q12H  .  feeding supplement (PRO-STAT SUGAR FREE 64)  30 mL Oral BID  . feeding supplement (VITAL HIGH PROTEIN)  1,000 mL Per Tube Q24H  . folic acid  1 mg Intravenous Daily  . free water  250 mL Per Tube Q6H  . insulin aspart  0-15 Units Subcutaneous 6 times per day  . lactulose  20 g Per Tube TID  . metoprolol tartrate  12.5 mg Per Tube BID  . thiamine  100 mg Intravenous Daily    ASSESSMENT/PLAN:  59 y.o. female admitted to the hospital after being found unresponsive at home. Initial work up revealed that CG was 54, elevated ammonia , and CT brain without acute abnormality. MRI brain 3/22 revealed signal abnormality on diffusion images involving the body and tail of the hippocampus extending into the posterior cingulate gyrus. No significant abnormal enhancement. Overall, such findings could represent post ictal changes, hypoglycemic encephalopathy, limbic encephalitis. EEG without electrographic seizures but a pattern most consistent with encephalopathy. CSF with significant pleocytosis with predominance of neutrophils, normal protein and glucose, negative cytology, pending gram stain and HSV. Suspected meningitis versus encephalitis. Continue current antibiotic regimen pending results HSV, gram stain, cultures. An autoimmune encephalitis should also be in the differential, thus will suggest anti- NMDAR and anti-AMPA receptor antibodies. A follow up MRI will be useful. Repeat EEG in am. ID consulted. Will follow up.   Wyatt Portela, MD [facility] [phone]  12/07/2014, 2:18 PM

## 2014-12-07 NOTE — Consult Note (Signed)
[facility]  Date of Admission:  12/04/2014  Date of Consult:  12/07/2014  Reason for Consult: meningitis Referring Physician: Nelda Marseille Impression/Recommendation Meningitis ARF Loose BM  Would: Continue current rx Await CSF Cx Await CSF HSV PCR Check stool C diff Check Hep C Ab Add steroids, watch her sugars while on this.  Watch her Cr, this may be worsening due to Acyclovir  Procalcitonin (-) HIV (-)  Comment- CSF Cx may be difficult to interpret as she got > 2 days of IV anbx prior to it being done.  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  The benefit of steroids at this point is unclear- would typically start at or before the initiation of anbx to prevent cytokine release from anbx bactericidal effects. This will increase her sugars.    Thank you so much for this interesting consult,   Bobby Rumpf (pager) [phone] [email or web address]  MARSHELLA TELLO is an 59 y.o. female.  HPI: 59 yo F with hx of DM, polysubstance abuse (ETOH, cocaine, marijuana), found down for (? Period) on 3-21. She was brought to ED and was thought to be agonal but improved after D50. She was normo-tensive to hypertensive, her temp was 100.6, WBC was normal. Her head CT was (-). She required intubation. UDS (-). CXR (-). She was started on amp/ceftriaxone/vanco/acyclovir. She underwent EEG on 3-22 that showed mild-mod encephalopathy.  She had MRI on 3-22: Symmetric signal abnormality on diffusion in the body and tail of the hippocampus extending into the posterior cingulate gyrus. No significant abnormal enhancement. 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 Differential diagnosis includes herpes encephalitis, paraneoplastic encephalitis, and seizure related signal abnormality. She had LP on 3-23: WBC 128 (82%), RBC 13,Prot 42, Glc 96.  BCx 3-21 are ngtd.    Past Medical History    Diagnosis Date  . Diabetes mellitus   . Hypertension   . Hyperlipemia   . Gout     History reviewed. No pertinent past surgical history.   No Known Allergies  Medications:  Scheduled: . acyclovir  550 mg Intravenous Q12H  . [START ON 12/08/2014] amLODipine  10 mg Per Tube Daily  . ampicillin (OMNIPEN) IV  2 g Intravenous Q6H  . antiseptic oral rinse  7 mL Mouth Rinse QID  . cefTRIAXone (ROCEPHIN)  IV  2 g Intravenous Q12H  . chlorhexidine  15 mL Mouth Rinse BID  . dextrose  1 ampule Intravenous Once  . famotidine (PEPCID) IV  20 mg Intravenous Q12H  . feeding supplement (PRO-STAT SUGAR FREE 64)  30 mL Oral BID  . feeding supplement (VITAL HIGH PROTEIN)  1,000 mL Per Tube Q24H  . folic acid  1 mg Intravenous Daily  . free water  250 mL Per Tube Q6H  . insulin aspart  0-15 Units Subcutaneous 6 times per day  . lactulose  20 g Per Tube TID  . metoprolol tartrate  12.5 mg Per Tube BID  . thiamine  100 mg Intravenous Daily    Abtx:  Anti-infectives    Start     Dose/Rate Route Frequency Ordered Stop   12/07/14 1800  acyclovir (ZOVIRAX) 550 mg in dextrose 5 % 100 mL IVPB     550 mg 111 mL/hr over 60 Minutes Intravenous Every 12 hours 12/07/14 1253     12/07/14 1400  ampicillin (OMNIPEN) 2 g in sodium chloride 0.9 % 50 mL IVPB  2 g 150 mL/hr over 20 Minutes Intravenous Every 6 hours 12/07/14 1253     12/07/14 1000  vancomycin (VANCOCIN) 500 mg in sodium chloride 0.9 % 100 mL IVPB  Status:  Discontinued     500 mg 100 mL/hr over 60 Minutes Intravenous Every 12 hours 12/06/14 2227 12/07/14 1255   12/05/14 2000  cefTRIAXone (ROCEPHIN) 2 g in dextrose 5 % 50 mL IVPB  Status:  Discontinued     2 g 100 mL/hr over 30 Minutes Intravenous Every 24 hours 12/04/14 2022 12/05/14 0051   12/05/14 1000  vancomycin (VANCOCIN) IVPB 750 mg/150 ml premix     750 mg 150 mL/hr over 60 Minutes Intravenous Every 12 hours 12/05/14 0105 12/06/14 2329   12/05/14 0800  cefTRIAXone (ROCEPHIN) 2 g  in dextrose 5 % 50 mL IVPB - Premix     2 g 100 mL/hr over 30 Minutes Intravenous Every 12 hours 12/05/14 0105     12/05/14 0115  ampicillin (OMNIPEN) 2 g in sodium chloride 0.9 % 50 mL IVPB  Status:  Discontinued     2 g 150 mL/hr over 20 Minutes Intravenous 6 times per day 12/05/14 0105 12/07/14 1253   12/05/14 0115  acyclovir (ZOVIRAX) 550 mg in dextrose 5 % 100 mL IVPB  Status:  Discontinued     550 mg 111 mL/hr over 60 Minutes Intravenous 3 times per day 12/05/14 0107 12/07/14 1253   12/04/14 2300  vancomycin (VANCOCIN) 2,000 mg in sodium chloride 0.9 % 500 mL IVPB  Status:  Discontinued     2,000 mg 250 mL/hr over 120 Minutes Intravenous  Once 12/04/14 2205 12/05/14 0051   12/04/14 2015  cefTRIAXone (ROCEPHIN) 2 g in dextrose 5 % 50 mL IVPB     2 g 100 mL/hr over 30 Minutes Intravenous  Once 12/04/14 2009 12/04/14 2100      Total days of antibiotics: 4 vanco/ceftriaxone/amp/acyclovir          Social History:  reports that she has been smoking Cigarettes.  She has been smoking about 2.00 packs per day. She does not have any smokeless tobacco history on file. She reports that she drinks alcohol. She reports that she uses illicit drugs (Cocaine and Marijuana).  History reviewed. No pertinent family history.  General ROS: loose BM in rectal tube. otherwise unavailable, pt intubated  Blood pressure 172/70, pulse 107, temperature 98.6 F (37 C), temperature source Axillary, resp. rate 16, height '5\' 4"'  (1.626 m), weight 97.5 kg (214 lb 15.2 oz), SpO2 100 %. General appearance: no distress, morbidly obese and no response to voice or exam Eyes: pupils = Neck: no adenopathy, supple, symmetrical, trachea midline and L neck IJ. I am unable to flex or rotate her neck.   Lungs: clear to auscultation bilaterally Heart: regular rate and rhythm Abdomen: normal findings: bowel sounds normal and soft, non-tender Extremities: edema none LE Skin: No rashes.   Results for orders placed or  performed during the hospital encounter of 12/04/14 (from the past 48 hour(s))  Glucose, capillary     Status: Abnormal   Collection Time: 12/05/14  4:16 PM  Result Value Ref Range   Glucose-Capillary 116 (H) 70 - 99 mg/dL  Glucose, capillary     Status: Abnormal   Collection Time: 12/05/14  7:48 PM  Result Value Ref Range   Glucose-Capillary 128 (H) 70 - 99 mg/dL   Comment 1 Notify RN    Comment 2 Document in Chart   Glucose, capillary  Status: Abnormal   Collection Time: 12/06/14 12:44 AM  Result Value Ref Range   Glucose-Capillary 239 (H) 70 - 99 mg/dL  Glucose, capillary     Status: Abnormal   Collection Time: 12/06/14  1:47 AM  Result Value Ref Range   Glucose-Capillary 265 (H) 70 - 99 mg/dL  Glucose, capillary     Status: Abnormal   Collection Time: 12/06/14  4:58 AM  Result Value Ref Range   Glucose-Capillary 137 (H) 70 - 99 mg/dL  Blood gas, arterial     Status: Abnormal   Collection Time: 12/06/14  7:30 AM  Result Value Ref Range   FIO2 0.30 %   Delivery systems VENTILATOR    Mode PRESSURE REGULATED VOLUME CONTROL    VT 440 mL   Rate 14 resp/min   Peep/cpap 5.0 cm H20   pH, Arterial 7.420 7.350 - 7.450   pCO2 arterial 33.4 (L) 35.0 - 45.0 mmHg   pO2, Arterial 152.0 (H) 80.0 - 100.0 mmHg   Bicarbonate 21.2 20.0 - 24.0 mEq/L   TCO2 17.8 0 - 100 mmol/L   Acid-base deficit 1.8 0.0 - 2.0 mmol/L   O2 Saturation 98.9 %   Patient temperature 98.6    Collection site RIGHT RADIAL    Drawn by [id number]    Sample type ARTERIAL DRAW    Allens test (pass/fail) PASS PASS  Glucose, capillary     Status: None   Collection Time: 12/06/14  7:53 AM  Result Value Ref Range   Glucose-Capillary 90 70 - 99 mg/dL   Comment 1 Document in Chart   VDRL, CSF     Status: None   Collection Time: 12/06/14 10:30 AM  Result Value Ref Range   VDRL Quant, CSF Non Reactive Non Rea:<1:1    Comment: (NOTE) Performed At: [facility] [place], [place] [id number] Lindon Romp MD [place]:[id number]   Glucose, CSF     Status: Abnormal   Collection Time: 12/06/14 11:00 AM  Result Value Ref Range   Glucose, CSF 96 (H) 43 - 76 mg/dL  Protein, CSF     Status: None   Collection Time: 12/06/14 11:00 AM  Result Value Ref Range   Total  Protein, CSF 42 15 - 45 mg/dL  CSF cell count with differential     Status: Abnormal   Collection Time: 12/06/14 11:00 AM  Result Value Ref Range   Tube # 4    Color, CSF COLORLESS COLORLESS   Appearance, CSF CLEAR CLEAR   Supernatant NOT INDICATED    RBC Count, CSF 13 (H) 0 /cu mm   WBC, CSF 128 (HH) 0 - 5 /cu mm    Comment: CRITICAL RESULT CALLED TO, READ BACK BY AND VERIFIED WITH: KOONTZ,A @ 1239 ON [id number] BY POTEAT,S    Segmented Neutrophils-CSF 82 (H) 0 - 6 %   Lymphs, CSF 4 (L) 40 - 80 %   Monocyte-Macrophage-Spinal Fluid 14 (L) 15 - 45 %   Other Cells, CSF CORRELATE WITH CYTOLOGY.   Glucose, capillary     Status: Abnormal   Collection Time: 12/06/14 11:29 AM  Result Value Ref Range   Glucose-Capillary 169 (H) 70 - 99 mg/dL   Comment 1 Notify RN    Comment 2 Document in Chart   CBC     Status: Abnormal   Collection Time: 12/06/14  3:00 PM  Result Value Ref Range   WBC 10.6 (H) 4.0 - 10.5 K/uL   RBC 2.65 (L) 3.87 -  5.11 MIL/uL   Hemoglobin 8.1 (L) 12.0 - 15.0 g/dL   HCT 25.3 (L) 36.0 - 46.0 %   MCV 95.5 78.0 - 100.0 fL   MCH 30.6 26.0 - 34.0 pg   MCHC 32.0 30.0 - 36.0 g/dL   RDW 14.7 11.5 - 15.5 %   Platelets 178 150 - 400 K/uL  Basic metabolic panel     Status: Abnormal   Collection Time: 12/06/14  3:00 PM  Result Value Ref Range   Sodium 143 135 - 145 mmol/L   Potassium 3.2 (L) 3.5 - 5.1 mmol/L    Comment: DELTA CHECK NOTED   Chloride 111 96 - 112 mmol/L   CO2 24 19 - 32 mmol/L   Glucose, Bld 160 (H) 70 - 99 mg/dL   BUN 23 6 - 23 mg/dL   Creatinine, Ser 1.02 0.50 - 1.10 mg/dL   Calcium 7.9 (L) 8.4 - 10.5 mg/dL   GFR calc non Af Amer 59 (L) >90 mL/min   GFR calc Af Amer 68 (L) >90 mL/min     Comment: (NOTE) The eGFR has been calculated using the CKD EPI equation. This calculation has not been validated in all clinical situations. eGFR's persistently <90 mL/min signify possible Chronic Kidney Disease.    Anion gap 8 5 - 15  Magnesium     Status: Abnormal   Collection Time: 12/06/14  3:00 PM  Result Value Ref Range   Magnesium 1.3 (L) 1.5 - 2.5 mg/dL  Phosphorus     Status: None   Collection Time: 12/06/14  3:00 PM  Result Value Ref Range   Phosphorus 3.3 2.3 - 4.6 mg/dL  Ammonia     Status: None   Collection Time: 12/06/14  3:00 PM  Result Value Ref Range   Ammonia 22 11 - 32 umol/L  Glucose, capillary     Status: Abnormal   Collection Time: 12/06/14  4:04 PM  Result Value Ref Range   Glucose-Capillary 151 (H) 70 - 99 mg/dL   Comment 1 Notify RN    Comment 2 Document in Chart   Glucose, capillary     Status: Abnormal   Collection Time: 12/06/14  7:40 PM  Result Value Ref Range   Glucose-Capillary 151 (H) 70 - 99 mg/dL   Comment 1 Notify RN    Comment 2 Document in Chart   Vancomycin, trough     Status: Abnormal   Collection Time: 12/06/14  9:33 PM  Result Value Ref Range   Vancomycin Tr 20.9 (H) 10.0 - 20.0 ug/mL  Glucose, capillary     Status: Abnormal   Collection Time: 12/06/14 11:19 PM  Result Value Ref Range   Glucose-Capillary 230 (H) 70 - 99 mg/dL   Comment 1 Notify RN    Comment 2 Document in Chart   Glucose, capillary     Status: Abnormal   Collection Time: 12/07/14  4:02 AM  Result Value Ref Range   Glucose-Capillary 139 (H) 70 - 99 mg/dL  Blood gas, arterial     Status: Abnormal   Collection Time: 12/07/14  5:00 AM  Result Value Ref Range   FIO2 0.30 %   Delivery systems VENTILATOR    Mode PRESSURE REGULATED VOLUME CONTROL    VT 440 mL   Rate 14 resp/min   Peep/cpap 5.0 cm H20   pH, Arterial 7.405 7.350 - 7.450   pCO2 arterial 32.9 (L) 35.0 - 45.0 mmHg   pO2, Arterial 145.0 (H) 80.0 - 100.0 mmHg  Bicarbonate 20.2 20.0 - 24.0 mEq/L    TCO2 19.3 0 - 100 mmol/L   Acid-base deficit 3.5 (H) 0.0 - 2.0 mmol/L   O2 Saturation 99.1 %   Patient temperature 99.0    Collection site RIGHT RADIAL    Drawn by [phone]    Sample type ARTERIAL DRAW    Allens test (pass/fail) PASS PASS  Ammonia     Status: Abnormal   Collection Time: 12/07/14  5:25 AM  Result Value Ref Range   Ammonia 33 (H) 11 - 32 umol/L  Basic metabolic panel     Status: Abnormal   Collection Time: 12/07/14  5:25 AM  Result Value Ref Range   Sodium 147 (H) 135 - 145 mmol/L   Potassium 3.5 3.5 - 5.1 mmol/L   Chloride 115 (H) 96 - 112 mmol/L   CO2 21 19 - 32 mmol/L   Glucose, Bld 150 (H) 70 - 99 mg/dL   BUN 32 (H) 6 - 23 mg/dL   Creatinine, Ser 1.52 (H) 0.50 - 1.10 mg/dL    Comment: REPEATED TO VERIFY   Calcium 8.2 (L) 8.4 - 10.5 mg/dL   GFR calc non Af Amer 36 (L) >90 mL/min   GFR calc Af Amer 42 (L) >90 mL/min    Comment: (NOTE) The eGFR has been calculated using the CKD EPI equation. This calculation has not been validated in all clinical situations. eGFR's persistently <90 mL/min signify possible Chronic Kidney Disease.    Anion gap 11 5 - 15  CBC     Status: Abnormal   Collection Time: 12/07/14  5:25 AM  Result Value Ref Range   WBC 10.4 4.0 - 10.5 K/uL   RBC 2.56 (L) 3.87 - 5.11 MIL/uL   Hemoglobin 7.9 (L) 12.0 - 15.0 g/dL   HCT 24.4 (L) 36.0 - 46.0 %   MCV 95.3 78.0 - 100.0 fL   MCH 30.9 26.0 - 34.0 pg   MCHC 32.4 30.0 - 36.0 g/dL   RDW 14.8 11.5 - 15.5 %   Platelets 167 150 - 400 K/uL  Magnesium     Status: Abnormal   Collection Time: 12/07/14  5:25 AM  Result Value Ref Range   Magnesium 3.1 (H) 1.5 - 2.5 mg/dL  Phosphorus     Status: None   Collection Time: 12/07/14  5:25 AM  Result Value Ref Range   Phosphorus 3.3 2.3 - 4.6 mg/dL  Glucose, capillary     Status: Abnormal   Collection Time: 12/07/14  8:10 AM  Result Value Ref Range   Glucose-Capillary 143 (H) 70 - 99 mg/dL  Glucose, capillary     Status: Abnormal   Collection Time:  12/07/14 12:20 PM  Result Value Ref Range   Glucose-Capillary 203 (H) 70 - 99 mg/dL      Component Value Date/Time   SDES URINE, CLEAN CATCH 12/04/2014 2131   [facility] NONE 12/04/2014 2131   CULT  12/04/2014 2131    Multiple bacterial morphotypes present, none predominant. Suggest appropriate recollection if clinically indicated. Performed at [facility] 12/05/2014 FINAL 12/04/2014 2131   Dg Chest Port 1 View  12/07/2014   CLINICAL DATA:  Hypoxia  EXAM: PORTABLE CHEST - 1 VIEW  COMPARISON:  December 06, 2014  FINDINGS: Endotracheal tube tip is 3.3 cm above the carina. Nasogastric tube tip and side port are below the diaphragm. Central catheter tip is in the superior vena cava near the cavoatrial junction. No pneumothorax. Lungs are clear.  The heart size and pulmonary vascularity are normal. No adenopathy. No bone lesions.  IMPRESSION: Tube and catheter positions as described without pneumothorax. No edema or consolidation.   Electronically Signed   By: Lowella Grip III M.D.   On: 12/07/2014 07:13   Dg Chest Port 1 View  12/06/2014   CLINICAL DATA:  Central line placement. Central line in surgeons site infection. Initial encounter. Patient Comments: Hx ETT CVC placement Central line insertion site infection, initial encounter; History: N/A; Study comments: N/A  EXAM: PORTABLE CHEST - 1 VIEW  COMPARISON:  12/06/2014, 0502 hours.  FINDINGS: Support apparatus: There is a new uncomplicated LEFT IJ central line with the tip in the mid SVC, just inferior to the carina. Enteric tube is unchanged. Endotracheal tube tip position unchanged, 27 mm from the carina. Enteric tube present. Monitoring leads project over the chest.  Cardiomediastinal Silhouette:  Unchanged and within normal limits.  Lungs: No airspace disease.  No effusion.  No pneumothorax.  Effusions:  None.  Other:  None.  IMPRESSION: 1. Uncomplicated interval placement of LEFT IJ central line with the tip in the mid  SVC. 2. Other support apparatus within normal limits. Endotracheal tube tip 27 mm from the carina. 3. No visible cardiopulmonary disease.   Electronically Signed   By: Dereck Ligas M.D.   On: 12/06/2014 13:37   Dg Chest Port 1 View  12/06/2014   CLINICAL DATA:  Intubation.  EXAM: PORTABLE CHEST - 1 VIEW  COMPARISON:  12/04/2014.  FINDINGS: Endotracheal tube and NG tube in good anatomic position. Heart size normal. No focal pulmonary infiltrate. No pleural effusion or pneumothorax. No acute osseous abnormality.  IMPRESSION: 1. Lines and tubes in stable position. 2. No acute cardiopulmonary disease identified. Chest is stable from prior exam.   Electronically Signed   By: Marcello Moores  Register   On: 12/06/2014 07:25   Dg Fluoro Guide Lumbar Puncture  12/06/2014   CLINICAL DATA:  Altered mental status, patient unresponsive/obtunded.  EXAM: DIAGNOSTIC LUMBAR PUNCTURE UNDER FLUOROSCOPIC GUIDANCE  FLUOROSCOPY TIME:  Radiation Exposure Index (as provided by the fluoroscopic device):  If the device does not provide the exposure index:  Fluoroscopy Time (in minutes and seconds):  1 minutes 57 seconds  Number of Acquired Images:  1  PROCEDURE: Informed consent was obtained from the patient prior to the procedure, including potential complications of headache, allergy, and pain. With the patient prone, the lower back was prepped with Betadine. 1% Lidocaine was used for local anesthesia. Lumbar puncture was performed at the L2-L3 level using a 22 gauge needle with return of clear CSF with an opening pressure of 12 cm water. 8.5 ml of CSF were obtained for laboratory studies. The patient tolerated the procedure well and there were no apparent complications.  IMPRESSION: Successful lumbar puncture without complication   Electronically Signed   By: Suzy Bouchard M.D.   On: 12/06/2014 12:22   Recent Results (from the past 240 hour(s))  Blood Culture (routine x 2)     Status: None (Preliminary result)   Collection Time:  12/04/14  8:10 PM  Result Value Ref Range Status   Specimen Description BLOOD RIGHT WRIST  Final   Special Requests BOTTLES DRAWN AEROBIC AND ANAEROBIC 5CC EACH  Final   Culture   Final           BLOOD CULTURE RECEIVED NO GROWTH TO DATE CULTURE WILL BE HELD FOR 5 DAYS BEFORE ISSUING A FINAL NEGATIVE REPORT Performed at [facility]  Report Status PENDING  Incomplete  Blood Culture (routine x 2)     Status: None (Preliminary result)   Collection Time: 12/04/14  8:11 PM  Result Value Ref Range Status   Specimen Description BLOOD LEFT ARM  Final   Special Requests BOTTLES DRAWN AEROBIC AND ANAEROBIC 5CC EACH  Final   Culture   Final           BLOOD CULTURE RECEIVED NO GROWTH TO DATE CULTURE WILL BE HELD FOR 5 DAYS BEFORE ISSUING A FINAL NEGATIVE REPORT Performed at [facility]    Report Status PENDING  Incomplete  Urine culture     Status: None   Collection Time: 12/04/14  9:31 PM  Result Value Ref Range Status   Specimen Description URINE, CLEAN CATCH  Final   Special Requests NONE  Final   Colony Count   Final    >=100,000 COLONIES/ML Performed at [facility]    Culture   Final    Multiple bacterial morphotypes present, none predominant. Suggest appropriate recollection if clinically indicated. Performed at [facility]    Report Status 12/05/2014 FINAL  Final  MRSA PCR Screening     Status: None   Collection Time: 12/05/14  4:23 AM  Result Value Ref Range Status   MRSA by PCR NEGATIVE NEGATIVE Final    Comment:        The GeneXpert MRSA Assay (FDA approved for NASAL specimens only), is one component of a comprehensive MRSA colonization surveillance program. It is not intended to diagnose MRSA infection nor to guide or monitor treatment for MRSA infections.       12/07/2014, 3:25 PM     LOS: 3 days

## 2014-12-08 ENCOUNTER — Inpatient Hospital Stay (HOSPITAL_COMMUNITY)
Admit: 2014-12-08 | Discharge: 2014-12-08 | Disposition: A | Discharge status: Home / Self Care | Payer: Medicare Other | Attending: Pulmonary Disease | Admitting: Pulmonary Disease

## 2014-12-08 ENCOUNTER — Inpatient Hospital Stay (HOSPITAL_COMMUNITY): Payer: Medicare Other

## 2014-12-08 DIAGNOSIS — G9340 Encephalopathy, unspecified: Secondary | ICD-10-CM

## 2014-12-08 DIAGNOSIS — J9601 Acute respiratory failure with hypoxia: Secondary | ICD-10-CM

## 2014-12-08 DIAGNOSIS — E875 Hyperkalemia: Secondary | ICD-10-CM

## 2014-12-08 LAB — PHOSPHORUS: Phosphorus: 5 mg/dL — ABNORMAL HIGH (ref 2.3–4.6)

## 2014-12-08 LAB — GLUCOSE, CAPILLARY
GLUCOSE-CAPILLARY: 231 mg/dL — AB (ref 70–99)
GLUCOSE-CAPILLARY: 266 mg/dL — AB (ref 70–99)
Glucose-Capillary: 201 mg/dL — ABNORMAL HIGH (ref 70–99)
Glucose-Capillary: 221 mg/dL — ABNORMAL HIGH (ref 70–99)
Glucose-Capillary: 232 mg/dL — ABNORMAL HIGH (ref 70–99)
Glucose-Capillary: 240 mg/dL — ABNORMAL HIGH (ref 70–99)

## 2014-12-08 LAB — HERPES SIMPLEX VIRUS(HSV) DNA BY PCR
HSV 1 DNA: NEGATIVE
HSV 2 DNA: NEGATIVE

## 2014-12-08 LAB — CBC
HEMATOCRIT: 23.2 % — AB (ref 36.0–46.0)
HEMOGLOBIN: 7.6 g/dL — AB (ref 12.0–15.0)
MCH: 31 pg (ref 26.0–34.0)
MCHC: 32.8 g/dL (ref 30.0–36.0)
MCV: 94.7 fL (ref 78.0–100.0)
Platelets: 181 10*3/uL (ref 150–400)
RBC: 2.45 MIL/uL — AB (ref 3.87–5.11)
RDW: 15 % (ref 11.5–15.5)
WBC: 10.2 10*3/uL (ref 4.0–10.5)

## 2014-12-08 LAB — BASIC METABOLIC PANEL
Anion gap: 12 (ref 5–15)
BUN: 57 mg/dL — ABNORMAL HIGH (ref 6–23)
CO2: 19 mmol/L (ref 19–32)
Calcium: 8.3 mg/dL — ABNORMAL LOW (ref 8.4–10.5)
Chloride: 114 mmol/L — ABNORMAL HIGH (ref 96–112)
Creatinine, Ser: 3.11 mg/dL — ABNORMAL HIGH (ref 0.50–1.10)
GFR calc Af Amer: 18 mL/min — ABNORMAL LOW (ref >90)
GFR calc non Af Amer: 15 mL/min — ABNORMAL LOW (ref >90)
Glucose, Bld: 234 mg/dL — ABNORMAL HIGH (ref 70–99)
POTASSIUM: 3.4 mmol/L — AB (ref 3.5–5.1)
SODIUM: 145 mmol/L (ref 135–145)

## 2014-12-08 LAB — BLOOD GAS, ARTERIAL
ACID-BASE DEFICIT: 5.6 mmol/L — AB (ref 0.0–2.0)
Bicarbonate: 18.8 mEq/L — ABNORMAL LOW (ref 20.0–24.0)
Drawn by: 308601
FIO2: 0.3 %
MECHVT: 440 mL
O2 SAT: 98.3 %
PEEP: 5 cmH2O
PO2 ART: 127 mmHg — AB (ref 80.0–100.0)
Patient temperature: 98.6
RATE: 14 resp/min
TCO2: 18 mmol/L (ref 0–100)
pCO2 arterial: 34.6 mmHg — ABNORMAL LOW (ref 35.0–45.0)
pH, Arterial: 7.355 (ref 7.350–7.450)

## 2014-12-08 LAB — HEPATITIS PANEL, ACUTE
HCV Ab: NEGATIVE
Hep A IgM: NONREACTIVE
Hep B C IgM: NONREACTIVE
Hepatitis B Surface Ag: NEGATIVE

## 2014-12-08 LAB — AMMONIA: Ammonia: 16 umol/L (ref 11–32)

## 2014-12-08 LAB — MAGNESIUM: Magnesium: 3.1 mg/dL — ABNORMAL HIGH (ref 1.5–2.5)

## 2014-12-08 LAB — CK: CK TOTAL: 167 U/L (ref 7–177)

## 2014-12-08 LAB — VANCOMYCIN, TROUGH: Vancomycin Tr: 33.4 ug/mL (ref 10.0–20.0)

## 2014-12-08 MED ORDER — FAMOTIDINE 40 MG/5ML PO SUSR
20.0000 mg | Freq: Every day | ORAL | Status: DC
  Filled 2014-12-08: qty 2.5

## 2014-12-08 MED ORDER — FOLIC ACID 1 MG PO TABS
1.0000 mg | ORAL_TABLET | Freq: Every day | ORAL | Status: DC
  Administered 2014-12-08 – 2014-12-27 (×19): 1 mg
  Filled 2014-12-08 (×19): qty 1

## 2014-12-08 MED ORDER — INSULIN ASPART 100 UNIT/ML ~~LOC~~ SOLN
0.0000 [IU] | SUBCUTANEOUS | Status: DC
  Administered 2014-12-09: 7 [IU] via SUBCUTANEOUS
  Administered 2014-12-09 (×2): 15 [IU] via SUBCUTANEOUS
  Administered 2014-12-09: 7 [IU] via SUBCUTANEOUS
  Administered 2014-12-09: 20 [IU] via SUBCUTANEOUS
  Administered 2014-12-10 (×4): 11 [IU] via SUBCUTANEOUS
  Administered 2014-12-10: 7 [IU] via SUBCUTANEOUS
  Administered 2014-12-10: 15 [IU] via SUBCUTANEOUS
  Administered 2014-12-11 (×2): 4 [IU] via SUBCUTANEOUS
  Administered 2014-12-11 (×2): 7 [IU] via SUBCUTANEOUS
  Administered 2014-12-11 (×2): 4 [IU] via SUBCUTANEOUS
  Administered 2014-12-12: 3 [IU] via SUBCUTANEOUS
  Administered 2014-12-12: 4 [IU] via SUBCUTANEOUS
  Administered 2014-12-12: 3 [IU] via SUBCUTANEOUS
  Administered 2014-12-12 – 2014-12-13 (×4): 4 [IU] via SUBCUTANEOUS
  Administered 2014-12-13 (×2): 3 [IU] via SUBCUTANEOUS
  Administered 2014-12-13: 7 [IU] via SUBCUTANEOUS
  Administered 2014-12-13: 3 [IU] via SUBCUTANEOUS
  Administered 2014-12-14 (×5): 4 [IU] via SUBCUTANEOUS
  Administered 2014-12-14: 7 [IU] via SUBCUTANEOUS
  Administered 2014-12-15 (×4): 4 [IU] via SUBCUTANEOUS
  Administered 2014-12-15 – 2014-12-16 (×3): 7 [IU] via SUBCUTANEOUS
  Administered 2014-12-16: 11 [IU] via SUBCUTANEOUS
  Administered 2014-12-16: 7 [IU] via SUBCUTANEOUS
  Administered 2014-12-16: 3 [IU] via SUBCUTANEOUS
  Administered 2014-12-16 (×2): 7 [IU] via SUBCUTANEOUS
  Administered 2014-12-17 (×2): 4 [IU] via SUBCUTANEOUS
  Administered 2014-12-17 (×2): 7 [IU] via SUBCUTANEOUS
  Administered 2014-12-17 (×3): 4 [IU] via SUBCUTANEOUS
  Administered 2014-12-18: 3 [IU] via SUBCUTANEOUS
  Administered 2014-12-18 (×3): 4 [IU] via SUBCUTANEOUS
  Administered 2014-12-18: 7 [IU] via SUBCUTANEOUS
  Administered 2014-12-19 (×2): 4 [IU] via SUBCUTANEOUS
  Administered 2014-12-19: 3 [IU] via SUBCUTANEOUS
  Administered 2014-12-19 – 2014-12-20 (×3): 4 [IU] via SUBCUTANEOUS
  Administered 2014-12-20 (×2): 7 [IU] via SUBCUTANEOUS
  Administered 2014-12-20: 4 [IU] via SUBCUTANEOUS
  Administered 2014-12-20: 7 [IU] via SUBCUTANEOUS
  Administered 2014-12-21: 4 [IU] via SUBCUTANEOUS
  Administered 2014-12-21: 7 [IU] via SUBCUTANEOUS
  Administered 2014-12-21: 3 [IU] via SUBCUTANEOUS
  Administered 2014-12-21: 7 [IU] via SUBCUTANEOUS
  Administered 2014-12-22: 4 [IU] via SUBCUTANEOUS
  Administered 2014-12-22: 7 [IU] via SUBCUTANEOUS
  Administered 2014-12-22: 4 [IU] via SUBCUTANEOUS
  Administered 2014-12-22: 7 [IU] via SUBCUTANEOUS
  Administered 2014-12-22 – 2014-12-23 (×4): 3 [IU] via SUBCUTANEOUS
  Administered 2014-12-23 (×2): 7 [IU] via SUBCUTANEOUS
  Administered 2014-12-24 (×2): 4 [IU] via SUBCUTANEOUS
  Administered 2014-12-24 (×2): 7 [IU] via SUBCUTANEOUS
  Administered 2014-12-24: 3 [IU] via SUBCUTANEOUS
  Administered 2014-12-24: 7 [IU] via SUBCUTANEOUS
  Administered 2014-12-25: 4 [IU] via SUBCUTANEOUS
  Administered 2014-12-25: 3 [IU] via SUBCUTANEOUS
  Administered 2014-12-25: 4 [IU] via SUBCUTANEOUS
  Administered 2014-12-25: 3 [IU] via SUBCUTANEOUS
  Administered 2014-12-25: 11 [IU] via SUBCUTANEOUS
  Administered 2014-12-26 (×2): 4 [IU] via SUBCUTANEOUS
  Administered 2014-12-26: 7 [IU] via SUBCUTANEOUS
  Administered 2014-12-26 – 2014-12-27 (×2): 4 [IU] via SUBCUTANEOUS
  Administered 2014-12-27 (×2): 7 [IU] via SUBCUTANEOUS

## 2014-12-08 MED ORDER — SODIUM CHLORIDE 0.9 % IV SOLN
INTRAVENOUS | Status: DC
  Administered 2014-12-08: 14:00:00 via INTRAVENOUS
  Administered 2014-12-09 – 2014-12-11 (×3): 1000 mL via INTRAVENOUS
  Administered 2014-12-12: 22:00:00 via INTRAVENOUS

## 2014-12-08 MED ORDER — HYDRALAZINE HCL 20 MG/ML IJ SOLN
10.0000 mg | INTRAMUSCULAR | Status: DC | PRN
  Administered 2014-12-08 (×2): 20 mg via INTRAVENOUS
  Administered 2014-12-09 – 2014-12-11 (×3): 40 mg via INTRAVENOUS
  Administered 2014-12-14: 10 mg via INTRAVENOUS
  Administered 2014-12-25: 20 mg via INTRAVENOUS
  Filled 2014-12-08 (×2): qty 2
  Filled 2014-12-08 (×3): qty 1
  Filled 2014-12-08: qty 2
  Filled 2014-12-08: qty 1

## 2014-12-08 MED ORDER — SODIUM CHLORIDE 0.9 % IV SOLN: 250.0000 mL | INTRAVENOUS | Status: DC | PRN

## 2014-12-08 MED ORDER — INSULIN GLARGINE 100 UNIT/ML ~~LOC~~ SOLN
15.0000 [IU] | Freq: Every day | SUBCUTANEOUS | Status: DC
  Administered 2014-12-09 (×2): 15 [IU] via SUBCUTANEOUS
  Filled 2014-12-08 (×2): qty 0.15

## 2014-12-08 MED ORDER — VITAMIN B-1 100 MG PO TABS
100.0000 mg | ORAL_TABLET | Freq: Every day | ORAL | Status: DC
  Administered 2014-12-09 – 2014-12-27 (×18): 100 mg
  Filled 2014-12-08 (×18): qty 1

## 2014-12-08 NOTE — Progress Notes (Addendum)
PULMONARY / CRITICAL CARE MEDICINE   Name: Gina Cox MRN: [id number] DOB: 1956-02-12    ADMISSION DATE:  12/04/2014 CONSULTATION DATE:  12/04/2014  REFERRING MD :  Madilyn Hook EDP  CHIEF COMPLAINT:  Found down  INITIAL PRESENTATION: 59 year old F brought to the [facility] ED on 12/04/2014 after being found down at home by daughter.  Last seen normal 1 day prior. Required intubation in the emergency department for airway protection.  PCCM called to admit.  NOTE:  PER EDP, PT WAS A DIFFICULT AIRWAY!!!  STUDIES:  3/21 CT head >>> no acute intracranial process 3/22 MRI Brain >> hippocampal diffusion signal abnormal to the posterior singulate gyrus  EEG 3/22 >> no seizure activity EEG 3/25 >>  LP 3/22 >> apparently cx and gram stain were not performed, no sample left to perform. 128 WBC with 82% PMNs, protein normal LP anti- NMDAR and anti-AMPA >> both pending  SIGNIFICANT EVENTS: 3/21 - admit  HISTORY OF PRESENT ILLNESS:  This is a 59 year old female who has a past medical history significant for hypertension, diabetes, and polysubstance abuse who was brought to the [facility] ED 3/21 after being found down unresponsive at home by her daughter.  She was last seen normal 1 day prior around 1PM.  Daughter states that this has happened before whenever pt does not eat and then goes to sleep.  Apparently this has been occuring more over the past 4 - 5 days.  When daughter went to visit her one day earlier, pt was on the ground but refused that she be brought to ED for evaluation as she was adamant that nothing was wrong with her.  Pt asked daughter to take her to the grocery store and per daughter, pt acted her normal self while shopping and on return back home.  When she left, pt was in her USOH.  On 3/21 when daughter got off work, she returned to pt's house and this is when she found her down and unresponsive.  From her description, it seems that pt was agonal at the time with very shallow respirations as well as  "gurgling / snoring" per daughter. On EMS arrival, initial CG was 54 (improved to 197 after 25g D50).  Daughter reports that pt drinks alcohol and does do drugs (marijuana and crack cocaine is what she knows of).  Last UDS in 2013 positive for cocaine.  SUBJECTIVE:  Obtunded, barely any response even to pain; has received no sedation  VITAL SIGNS: Temp:  [98.1 F (36.7 C)-99.7 F (37.6 C)] 98.2 F (36.8 C) (03/25 0800) Pulse Rate:  [69-107] 92 (03/25 1000) Resp:  [13-24] 20 (03/25 1000) BP: (127-225)/(47-95) 196/73 mmHg (03/25 1000) SpO2:  [87 %-100 %] 100 % (03/25 1000) FiO2 (%):  [30 %] 30 % (03/25 0950) Weight:  [100.4 kg (221 lb 5.5 oz)] 100.4 kg (221 lb 5.5 oz) (03/25 0200) HEMODYNAMICS:   VENTILATOR SETTINGS: Vent Mode:  [-] PSV;CPAP FiO2 (%):  [30 %] 30 % Set Rate:  [14 bmp] 14 bmp Vt Set:  [440 mL] 440 mL PEEP:  [5 cmH20] 5 cmH20 Pressure Support:  [10 cmH20] 10 cmH20 Plateau Pressure:  [14 cmH20-17 cmH20] 17 cmH20 INTAKE / OUTPUT:  Intake/Output Summary (Last 24 hours) at 12/08/14 1137 Last data filed at 12/08/14 1030  Gross per 24 hour  Intake 2780.33 ml  Output   1480 ml  Net 1300.33 ml   PHYSICAL EXAMINATION: General:  AA female, appears older than stated ag Neuro: Not sedated.  Unresponsive, some  posturing with pain, no other response HEENT: [place]/AT. PERRL, sclerae anicteric. Cardiovascular: RRR, no M/R/G.  Lungs: Respirations even and unlabored.  CTA bilaterally, No wheeze Abdomen: Oese, BS hypoactive, soft, NT/ND.  Musculoskeletal: No gross deformities, no edema.  Skin: Intact, warm, no rashes.  LABS:  CBC  Recent Labs Lab 12/06/14 1500 12/07/14 0525 12/08/14 0515  WBC 10.6* 10.4 10.2  HGB 8.1* 7.9* 7.6*  HCT 25.3* 24.4* 23.2*  PLT 178 167 181   Coag's  Recent Labs Lab 12/04/14 2031  INR 0.97   BMET  Recent Labs Lab 12/07/14 0525 12/07/14 2100 12/08/14 0515  NA 147* 147* 145  K 3.5 3.3* 3.4*  CL 115* 114* 114*  CO2 BUN 32* 43* 57*  CREATININE 1.52* 2.58* 3.11*  GLUCOSE 150* 208* 234*   Electrolytes  Recent Labs Lab 12/06/14 1500 12/07/14 0525 12/07/14 2100 12/08/14 0515  CALCIUM 7.9* 8.2* 8.5 8.3*  MG 1.3* 3.1*  --  3.1*  PHOS 3.3 3.3  --  5.0*   Sepsis Markers  Recent Labs Lab 12/04/14 2039 12/05/14 0145 12/05/14 0216  LATICACIDVEN 1.34 1.8  --   PROCALCITON  --   --  <0.10   ABG  Recent Labs Lab 12/06/14 0730 12/07/14 0500 12/08/14 0432  PHART 7.420 7.405 7.355  PCO2ART 33.4* 32.9* 34.6*  PO2ART 152.0* 145.0* 127.0*   Liver Enzymes  Recent Labs Lab 12/04/14 2031  AST 25  ALT 13  ALKPHOS 73  BILITOT 0.4  ALBUMIN 3.8   Cardiac Enzymes  Recent Labs Lab 12/05/14 0145  TROPONINI <0.03   Glucose  Recent Labs Lab 12/07/14 1220 12/07/14 1647 12/07/14 1948 12/07/14 2348 12/08/14 0345 12/08/14 0813  GLUCAP 203* 140* 161* 221* 201* 232*   Imaging Dg Chest Port 1 View  12/07/2014   CLINICAL DATA:  Hypoxia  EXAM: PORTABLE CHEST - 1 VIEW  COMPARISON:  December 06, 2014  FINDINGS: Endotracheal tube tip is 3.3 cm above the carina. Nasogastric tube tip and side port are below the diaphragm. Central catheter tip is in the superior vena cava near the cavoatrial junction. No pneumothorax. Lungs are clear. The heart size and pulmonary vascularity are normal. No adenopathy. No bone lesions.  IMPRESSION: Tube and catheter positions as described without pneumothorax. No edema or consolidation.   Electronically Signed   By: Bretta Bang III M.D.   On: 12/07/2014 07:13   ASSESSMENT / PLAN:  NEUROLOGIC A:   Acute encephalopathy, suspect toxic / metabolic / hypoglycemic injury. Must also consider infxn and other causes encephalitis.  Note MRI with bilateral hippocampal enhancement, LP with neutrophilia. Cx unfortunately not done ETOH use  Polysubstance abuse, UDS negative.  P:   Sedation: Off all sedation. RASS goal: 0 to -1 but patient remains completely unresponsive  on her own. Thiamine / Folate. Ammonia down to 33 with lactulose, RPR ngative, HIV negative, TSH 0.773. Continue lactulose. Neurology consult appreciated. Repeat EEG pending 3/25 Encephalopathy markers ( anti- NMDAR and anti-AMPA ) pending  PULMONARY OETT 3/21 >>> A:  Acute respiratory failure secondary to inability to protect airway Tobacco use disorder DIFFICULT AIRWAY PER EDP - very anterior with small airway P:   VAP bundle. Continue PS trials but no extubation planned given mental status. Follow CXR Tobacco cessation counseling if improves. Soft wrist restraints in attempt to decrease chance of self extubation. ? Whether we will need to consider trach vs withdrawal depending neuro w/u and prognosis  CARDIOVASCULAR A: Hx HTN, HLD, cocaine abuse  P:  Monitor hemodynamics. Hold outpatient lisinopril, nebivolol, simvastatin. Amlodipine to 10 for BP control. lopressor 12.5 mg PO BID.  RENAL A:  Acute on CKD, UOP decreased last 24h Hyperkalemia Consider rhabdomyolysis given found down P:   Check CK Increase IVF 3/25, especially in light of renal toxic meds like acyclovir If no rebound next 24h may need to involve renal. Decision to perform HD will need to consider overall neuro prognosis  GASTROINTESTINAL A:  GI prophylaxis Nutrition P:   SUP:  Famotidine. TF per nutrition.  HEMATOLOGIC A:  VTE prophylaxis P:  Heparin. Follow CBC  INFECTIOUS A:   Considering meningitis - exam not impressive for meningitis, but will treat given degree of AMS and the neutrophils on LP P:   BCx2 3/21 > UC 3/21 > Abx: Vanc, start date 3/21>>>  Abx: Ceftriaxone, start date 3/21>>> Abx: Ampicillin, start date 3/21>>> Abx: Acyclovir, start date, 3/21>>  PCT <0.10 making infection very unlikely but spoke with ID and they recommended continued treatment   ENDOCRINE A:  DM P:   CBG's q4hrs. SSI. Hold outpatient glimepiride, lantus, pioglitazone-metformin.  FAMILY  -  Updates: No family bedside.  - Inter-disciplinary family meet or Palliative Care meeting due by:  12/10/14.  Independent CC time 40 minutes  Levy Pupa, MD, PhD 12/08/2014, 11:51 AM [facility] [phone] or if no answer [phone]

## 2014-12-08 NOTE — Progress Notes (Signed)
Inpatient Diabetes Program Recommendations  AACE/ADA: New Consensus Statement on Inpatient Glycemic Control (2013)  Target Ranges:  Prepandial:   less than 140 mg/dL      Peak postprandial:   less than 180 mg/dL (1-2 hours)      Critically ill patients:  140 - 180 mg/dL     Results for Gina Cox, Gina Cox (MRN [id number]) as of 12/08/2014 08:36  Ref. Range 12/06/2014 23:19 12/07/2014 04:02 12/07/2014 08:10 12/07/2014 12:20 12/07/2014 16:47 12/07/2014 19:48  Glucose-Capillary Latest Range: 70-99 mg/dL 491 (H) 791 (H) 505 (H) 203 (H) 140 (H) 161 (H)    Results for Gina Cox, Gina Cox (MRN [id number]) as of 12/08/2014 08:36  Ref. Range 12/07/2014 23:48 12/08/2014 03:45 12/08/2014 08:13  Glucose-Capillary Latest Range: 70-99 mg/dL 553 (H) 748 (H) 270 (H)     Admit with: Found down by daughter  History: DM, HTN  Home DM Meds: Lantus 45 units QHS       Actos Plus Metformin 15/500 mg bid  Current DM Orders: Novolog Moderate SSI Q4 hours     **Note Decadron 10 mg IV QID added yesterday at 6PM.  **Patient also receiving Vital HP tube feeds @ 40 cc/hour.  **Glucose levels > 200 mg/dl since midnight.  **Also note that patient had Hypoglycemia issues back on day of admission- 03/21.    MD- Please consider the following while patient receiving tube feeds and IV steroids:  1. Add low dose Levemir- Levemir 10 units QHS (0.1 units/kg dosing)  2. Add Novolog tube feed coverage- Novolog 3 units Q4 hours (hold if tube feeds held for any reason)     Will follow Ambrose Finland RN, MSN, CDE Diabetes Coordinator Inpatient Diabetes Program Team Pager: [phone] (8a-5p)

## 2014-12-08 NOTE — Progress Notes (Signed)
eLink Physician-Brief Progress Note Patient Name: ZENDAYAH ELENES DOB: 1956/02/13 MRN: [id number]   Date of Service  12/08/2014  HPI/Events of Note  Notified by nurse that blood sugar greater than 300 and control has been poor for at least past 24 hrs  eICU Interventions  SSI changed to resistant and lantus 15 units added QHS     Intervention Category Major Interventions: Hyperglycemia - active titration of insulin therapy  Henry Russel, P 12/08/2014, 11:52 PM

## 2014-12-08 NOTE — Progress Notes (Addendum)
Subjective: Remains intubated off sedation, opens eyes to painful stimuli.  Remains on lactulose. BP has been labile due to stimulation causing increased BP intermittently. Cr continues to increase.   Objective: Current vital signs: BP 225/95 mmHg  Pulse 100  Temp(Src) 99.4 F (37.4 C) (Oral)  Resp 24  Ht 5\' 4"  (1.626 m)  Wt 100.4 kg (221 lb 5.5 oz)  BMI 37.97 kg/m2  SpO2 99% Vital signs in last 24 hours: Temp:  [98.1 F (36.7 C)-99.7 F (37.6 C)] 99.4 F (37.4 C) (03/25 0427) Pulse Rate:  [69-107] 100 (03/25 0600) Resp:  [13-24] 24 (03/25 0600) BP: (127-225)/(51-95) 225/95 mmHg (03/25 0600) SpO2:  [87 %-100 %] 99 % (03/25 0600) FiO2 (%):  [30 %] 30 % (03/25 0600) Weight:  [100.4 kg (221 lb 5.5 oz)] 100.4 kg (221 lb 5.5 oz) (03/25 0200)  Intake/Output from previous day: 03/24 0701 - 03/25 0700 In: 2770.3 [I.V.:129.3; NG/GT:2030; IV Piggyback:611] Out: 1270 [Urine:395; Stool:875] Intake/Output this shift:   Nutritional status:    Neurologic Exam: Patient is off sedatives, intubated on the vent. Follows no commands and only opens eyes briefly to noxious stimuli. Intermittently coughs and bucks the vent. Breathing over the vent.  CN 2-12: pupils 4 mm bilaterally reactive, no gaze preference, no nystagmus. Corneal responses present, face symmetric, tongue: intubated. Motor: Moves all limbs upon painful stimuli. Sensory: reacts to pain. But does not localize.  Plantars: right upgoing,  left downgoing. Coordination and gait: unable to test. Neck supple.  Lab Results: Basic Metabolic Panel:  Recent Labs Lab 12/05/14 0145 12/06/14 1500 12/07/14 0525 12/07/14 2100 12/08/14 0515  NA 140 143 147* 147* 145  K 4.9 3.2* 3.5 3.3* 3.4*  CL 112 111 115* 114* 114*  CO2 19 24 21 21 19   GLUCOSE 88 160* 150* 208* 234*  BUN 22 23 32* 43* 57*  CREATININE 1.07 1.02 1.52* 2.58* 3.11*  CALCIUM 8.1* 7.9* 8.2* 8.5 8.3*  MG  --  1.3* 3.1*  --  3.1*  PHOS  --  3.3 3.3  --  5.0*     Liver Function Tests:  Recent Labs Lab 12/04/14 2031  AST 25  ALT 13  ALKPHOS 73  BILITOT 0.4  PROT 7.5  ALBUMIN 3.8   No results for input(s): LIPASE, AMYLASE in the last 168 hours.  Recent Labs Lab 12/05/14 0052 12/06/14 1500 12/07/14 0525  AMMONIA 60* 22 33*    CBC:  Recent Labs Lab 12/04/14 2031 12/05/14 0145 12/05/14 0700 12/06/14 1500 12/07/14 0525 12/08/14 0515  WBC 8.7 8.1  --  10.6* 10.4 10.2  NEUTROABS 7.1  --   --   --   --   --   HGB 10.2* 8.8*  --  8.1* 7.9* 7.6*  HCT 31.2* 27.0* 27.0* 25.3* 24.4* 23.2*  MCV 94.3 95.4  --  95.5 95.3 94.7  PLT 275 218  --  178 167 181    Cardiac Enzymes:  Recent Labs Lab 12/05/14 0145  TROPONINI <0.03    Lipid Panel: No results for input(s): CHOL, TRIG, HDL, CHOLHDL, VLDL, LDLCALC in the last 168 hours.  CBG:  Recent Labs Lab 12/07/14 1647 12/07/14 1948 12/07/14 2348 12/08/14 0345 12/08/14 0813  GLUCAP 140* 161* 221* 201* 232*    Microbiology: Results for orders placed or performed during the hospital encounter of 12/04/14  Blood Culture (routine x 2)     Status: None (Preliminary result)   Collection Time: 12/04/14  8:10 PM  Result Value Ref Range  Status   Specimen Description BLOOD RIGHT WRIST  Final   Special Requests BOTTLES DRAWN AEROBIC AND ANAEROBIC 5CC EACH  Final   Culture   Final           BLOOD CULTURE RECEIVED NO GROWTH TO DATE CULTURE WILL BE HELD FOR 5 DAYS BEFORE ISSUING A FINAL NEGATIVE REPORT Performed at [facility]    Report Status PENDING  Incomplete  Blood Culture (routine x 2)     Status: None (Preliminary result)   Collection Time: 12/04/14  8:11 PM  Result Value Ref Range Status   Specimen Description BLOOD LEFT ARM  Final   Special Requests BOTTLES DRAWN AEROBIC AND ANAEROBIC 5CC EACH  Final   Culture   Final           BLOOD CULTURE RECEIVED NO GROWTH TO DATE CULTURE WILL BE HELD FOR 5 DAYS BEFORE ISSUING A FINAL NEGATIVE REPORT Performed at [facility]    Report Status PENDING  Incomplete  Urine culture     Status: None   Collection Time: 12/04/14  9:31 PM  Result Value Ref Range Status   Specimen Description URINE, CLEAN CATCH  Final   Special Requests NONE  Final   Colony Count   Final    >=100,000 COLONIES/ML Performed at [facility]    Culture   Final    Multiple bacterial morphotypes present, none predominant. Suggest appropriate recollection if clinically indicated. Performed at [facility]    Report Status 12/05/2014 FINAL  Final  MRSA PCR Screening     Status: None   Collection Time: 12/05/14  4:23 AM  Result Value Ref Range Status   MRSA by PCR NEGATIVE NEGATIVE Final    Comment:        The GeneXpert MRSA Assay (FDA approved for NASAL specimens only), is one component of a comprehensive MRSA colonization surveillance program. It is not intended to diagnose MRSA infection nor to guide or monitor treatment for MRSA infections.   Clostridium Difficile by PCR     Status: None   Collection Time: 12/07/14  4:28 PM  Result Value Ref Range Status   C difficile by pcr NEGATIVE NEGATIVE Final    Coagulation Studies: No results for input(s): LABPROT, INR in the last 72 hours.  Imaging: Dg Chest Port 1 View  12/08/2014   CLINICAL DATA:  Intubation.  EXAM: PORTABLE CHEST - 1 VIEW  COMPARISON:  12/07/2014.  FINDINGS: Endotracheal tube, left IJ line, NG tube in good anatomic position. Heart size normal. Mild basilar atelectasis. No pleural effusion or pneumothorax.  IMPRESSION: 1. Lines and tubes stable position. 2. Mild basilar atelectasis.   Electronically Signed   By: Maisie Fus  Register   On: 12/08/2014 07:29   Dg Chest Port 1 View  12/07/2014   CLINICAL DATA:  Hypoxia  EXAM: PORTABLE CHEST - 1 VIEW  COMPARISON:  December 06, 2014  FINDINGS: Endotracheal tube tip is 3.3 cm above the carina. Nasogastric tube tip and side port are below the diaphragm. Central catheter tip is in the superior vena  cava near the cavoatrial junction. No pneumothorax. Lungs are clear. The heart size and pulmonary vascularity are normal. No adenopathy. No bone lesions.  IMPRESSION: Tube and catheter positions as described without pneumothorax. No edema or consolidation.   Electronically Signed   By: Bretta Bang III M.D.   On: 12/07/2014 07:13   Dg Chest Port 1 View  12/06/2014   CLINICAL DATA:  Central line  placement. Central line in surgeons site infection. Initial encounter. Patient Comments: Hx ETT CVC placement Central line insertion site infection, initial encounter; History: N/A; Study comments: N/A  EXAM: PORTABLE CHEST - 1 VIEW  COMPARISON:  12/06/2014, 0502 hours.  FINDINGS: Support apparatus: There is a new uncomplicated LEFT IJ central line with the tip in the mid SVC, just inferior to the carina. Enteric tube is unchanged. Endotracheal tube tip position unchanged, 27 mm from the carina. Enteric tube present. Monitoring leads project over the chest.  Cardiomediastinal Silhouette:  Unchanged and within normal limits.  Lungs: No airspace disease.  No effusion.  No pneumothorax.  Effusions:  None.  Other:  None.  IMPRESSION: 1. Uncomplicated interval placement of LEFT IJ central line with the tip in the mid SVC. 2. Other support apparatus within normal limits. Endotracheal tube tip 27 mm from the carina. 3. No visible cardiopulmonary disease.   Electronically Signed   By: Andreas Newport M.D.   On: 12/06/2014 13:37   Dg Fluoro Guide Lumbar Puncture  12/06/2014   CLINICAL DATA:  Altered mental status, patient unresponsive/obtunded.  EXAM: DIAGNOSTIC LUMBAR PUNCTURE UNDER FLUOROSCOPIC GUIDANCE  FLUOROSCOPY TIME:  Radiation Exposure Index (as provided by the fluoroscopic device):  If the device does not provide the exposure index:  Fluoroscopy Time (in minutes and seconds):  1 minutes 57 seconds  Number of Acquired Images:  1  PROCEDURE: Informed consent was obtained from the patient prior to the procedure,  including potential complications of headache, allergy, and pain. With the patient prone, the lower back was prepped with Betadine. 1% Lidocaine was used for local anesthesia. Lumbar puncture was performed at the L2-L3 level using a 22 gauge needle with return of clear CSF with an opening pressure of 12 cm water. 8.5 ml of CSF were obtained for laboratory studies. The patient tolerated the procedure well and there were no apparent complications.  IMPRESSION: Successful lumbar puncture without complication   Electronically Signed   By: Genevive Bi M.D.   On: 12/06/2014 12:22    Medications:  Scheduled: . acyclovir  550 mg Intravenous Q24H  . amLODipine  10 mg Per Tube Daily  . ampicillin (OMNIPEN) IV  2 g Intravenous 3 times per day  . antiseptic oral rinse  7 mL Mouth Rinse QID  . cefTRIAXone (ROCEPHIN)  IV  2 g Intravenous Q12H  . chlorhexidine  15 mL Mouth Rinse BID  . dexamethasone  10 mg Intravenous 4 times per day  . dextrose  1 ampule Intravenous Once  . famotidine (PEPCID) IV  20 mg Intravenous Q12H  . feeding supplement (PRO-STAT SUGAR FREE 64)  30 mL Oral BID  . feeding supplement (VITAL HIGH PROTEIN)  1,000 mL Per Tube Q24H  . folic acid  1 mg Intravenous Daily  . free water  250 mL Per Tube Q6H  . insulin aspart  0-15 Units Subcutaneous 6 times per day  . lactulose  20 g Per Tube TID  . metoprolol tartrate  12.5 mg Per Tube BID  . thiamine  100 mg Intravenous Daily    Assessment/Plan: 59 y.o. female admitted to the hospital after being found unresponsive at home. Initial work up revealed that CG was 54, elevated ammonia , and CT brain without acute abnormality. MRI brain 3/22 revealed signal abnormality on diffusion images involving the body and tail of the hippocampus extending into the posterior cingulate gyrus. No significant abnormal enhancement. Overall, such findings could represent post ictal changes, hypoglycemic encephalopathy, limbic  encephalitis. EEG without  electrographic seizures but a pattern most consistent with encephalopathy.  CSF with significant pleocytosis with predominance of neutrophils, normal protein and glucose, negative cytology, pending gram stain and HSV pending, anti- NMDAR and anti-AMPA pending. Repeat EEG currently underway.   Will continue to follow.     Felicie Morn PA-C [facility] [phone]  12/08/2014, 8:50 AM

## 2014-12-08 NOTE — Progress Notes (Signed)
NUTRITION FOLLOW UP  Intervention:   Continue Vital High Protein via OGT goal rate of 40 ml/hr.   30 ml Pro-stat BID  Current TF regimen provides 1250 kcal (23 g/kg ideal body weight), 114 g protein and 803 ml free water  Nutrition Dx:   Inadequate oral intake related to inability to eat as evidenced by NPO status; ongoing.  Goal:   Enteral nutrition to provide 60-70% of estimated caloric needs (22-25 g/kg of ideal body weight) and 100% of estimated protein needs per ASPEN guideline of hypo caloric high protein feeding of critically ill obese individuals; met.  Monitor:   TF initiation/tolerance/adequacy, weight trends, labs, I/Os  Assessment:   Pt found unresponsive, brought to ED and required intubation. PMH significant for HTN, DM, polysubstance abuse.   Pt is currently intubated on ventilator support: MVe: 8.0 L/min Temp (24hrs), Avg:98.9 F (37.2 C), Min:98.1 F (36.7 C), Max:99.7 F (37.6 C)   Propofol: none  No signs of fat or muscle wasting  Pt discussed in pt rounds, tolerating TF well  Possible meningitis  Height: Ht Readings from Last 1 Encounters:  12/07/14 _0  (1.626 m)    Weight Status:   Wt Readings from Last 1 Encounters:  12/08/14 221 lb 5.5 oz (100.4 kg)    Re-estimated needs:  Kcal: 1731 (1210-1376 = 22-25 g/kg ideal body weight) Protein: >/= 110 g protein Fluid: >/= 1.6 L  Skin: intact  Diet Order:     Intake/Output Summary (Last 24 hours) at 12/08/14 0938 Last data filed at 12/08/14 0900  Gross per 24 hour  Intake 2420.33 ml  Output   1270 ml  Net 1150.33 ml    Last BM: 3/24   Labs:   Recent Labs Lab 12/06/14 1500 12/07/14 0525 12/07/14 2100 12/08/14 0515  NA 143 147* 147* 145  K 3.2* 3.5 3.3* 3.4*  CL 111 115* 114* 114*  CO2 _1 BUN 23 32* 43* 57*  CREATININE 1.02 1.52* 2.58* 3.11*  CALCIUM 7.9* 8.2* 8.5 8.3*  MG 1.3* 3.1*  --  3.1*  PHOS 3.3 3.3  --  5.0*  GLUCOSE 160* 150* 208* 234*    CBG  (last 3)   Recent Labs  12/07/14 2348 12/08/14 0345 12/08/14 0813  GLUCAP 221* 201* 232*    Scheduled Meds: . acyclovir  550 mg Intravenous Q24H  . amLODipine  10 mg Per Tube Daily  . ampicillin (OMNIPEN) IV  2 g Intravenous 3 times per day  . antiseptic oral rinse  7 mL Mouth Rinse QID  . cefTRIAXone (ROCEPHIN)  IV  2 g Intravenous Q12H  . chlorhexidine  15 mL Mouth Rinse BID  . dexamethasone  10 mg Intravenous 4 times per day  . dextrose  1 ampule Intravenous Once  . famotidine (PEPCID) IV  20 mg Intravenous Q12H  . feeding supplement (PRO-STAT SUGAR FREE 64)  30 mL Oral BID  . feeding supplement (VITAL HIGH PROTEIN)  1,000 mL Per Tube Q24H  . folic acid  1 mg Intravenous Daily  . free water  250 mL Per Tube Q6H  . insulin aspart  0-15 Units Subcutaneous 6 times per day  . lactulose  20 g Per Tube TID  . metoprolol tartrate  12.5 mg Per Tube BID  . thiamine  100 mg Intravenous Daily    Continuous Infusions:   Elmer Picker MS Dietetic Intern Pager Number [phone]

## 2014-12-08 NOTE — Progress Notes (Signed)
EEG completed; results pending.    

## 2014-12-08 NOTE — Progress Notes (Signed)
[facility]    Date of Admission:  12/04/2014   Total days of antibiotics 5        Day 5 amp/ctx/vanco/acyclovir           ID: Gina Cox is a 59 y.o. female with   Active Problems:   Acute encephalopathy   Altered mental status   Acute respiratory failure with hypoxia   Encephalitis    Subjective: Remains intubated, non responsive, afebrile. But opens eyes to stimuli previous exams. Clinical status unchanged  Medications:  . acyclovir  550 mg Intravenous Q24H  . amLODipine  10 mg Per Tube Daily  . ampicillin (OMNIPEN) IV  2 g Intravenous 3 times per day  . antiseptic oral rinse  7 mL Mouth Rinse QID  . cefTRIAXone (ROCEPHIN)  IV  2 g Intravenous Q12H  . chlorhexidine  15 mL Mouth Rinse BID  . dexamethasone  10 mg Intravenous 4 times per day  . dextrose  1 ampule Intravenous Once  . [START ON 12/09/2014] famotidine  20 mg Per Tube Daily  . feeding supplement (PRO-STAT SUGAR FREE 64)  30 mL Oral BID  . feeding supplement (VITAL HIGH PROTEIN)  1,000 mL Per Tube Q24H  . folic acid  1 mg Per Tube Daily  . free water  250 mL Per Tube Q6H  . insulin aspart  0-15 Units Subcutaneous 6 times per day  . lactulose  20 g Per Tube TID  . metoprolol tartrate  12.5 mg Per Tube BID  . [START ON 12/09/2014] thiamine  100 mg Per Tube Daily    Objective: Vital signs in last 24 hours: Temp:  [98.2 F (36.8 C)-99.7 F (37.6 C)] 98.9 F (37.2 C) (03/25 1600) Pulse Rate:  [69-104] 104 (03/25 1600) Resp:  [15-24] 20 (03/25 1600) BP: (127-225)/(47-95) 215/81 mmHg (03/25 1600) SpO2:  [87 %-100 %] 98 % (03/25 1600) FiO2 (%):  [30 %] 30 % (03/25 1600) Weight:  [221 lb 5.5 oz (100.4 kg)] 221 lb 5.5 oz (100.4 kg) (03/25 0200)  Physical Exam  Constitutional:  Sedated and intubaed. appears well-developed and well-nourished. No distress.  HENT: [place]/at, conjunctival edema, ETT in place.left ij Mouth/Throat:  Cardiovascular: Normal rate, regular rhythm and normal  heart sounds. Exam reveals no gallop and no friction rub.  No murmur heard.  Pulmonary/Chest: Effort normal and breath sounds normal. No respiratory distress.  has no wheezes.  Abdominal: Soft. Bowel sounds are decrease.  Skin: Skin is warm and dry. No rash noted. No erythema.    Lab Results  Recent Labs  12/07/14 0525 12/07/14 2100 12/08/14 0515  WBC 10.4  --  10.2  HGB 7.9*  --  7.6*  HCT 24.4*  --  23.2*  NA 147* 147* 145  K 3.5 3.3* 3.4*  CL 115* 114* 114*  CO2 BUN 32* 43* 57*  CREATININE 1.52* 2.58* 3.11*    Microbiology:  Studies/Results: Dg Chest Port 1 View  12/08/2014   CLINICAL DATA:  Intubation.  EXAM: PORTABLE CHEST - 1 VIEW  COMPARISON:  12/07/2014.  FINDINGS: Endotracheal tube, left IJ line, NG tube in good anatomic position. Heart size normal. Mild basilar atelectasis. No pleural effusion or pneumothorax.  IMPRESSION: 1. Lines and tubes stable position. 2. Mild basilar atelectasis.   Electronically Signed   By: Maisie Fus  Register   On: 12/08/2014 07:29   Dg Chest Port 1 View  12/07/2014   CLINICAL DATA:  Hypoxia  EXAM: PORTABLE CHEST - 1 VIEW  COMPARISON:  December 06, 2014  FINDINGS: Endotracheal tube tip is 3.3 cm above the carina. Nasogastric tube tip and side port are below the diaphragm. Central catheter tip is in the superior vena cava near the cavoatrial junction. No pneumothorax. Lungs are clear. The heart size and pulmonary vascularity are normal. No adenopathy. No bone lesions.  IMPRESSION: Tube and catheter positions as described without pneumothorax. No edema or consolidation.   Electronically Signed   By: Bretta Bang III M.D.   On: 12/07/2014 07:13     Assessment/Plan:   Encephalopathy = has some neutrophilic pleocytosis in CSF. 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 For now, we will continue with meningitis treatment. Awaiting HSV PCR results in order to determine stopping  acyclovir, and possibly ampicillin  Acute kidney injury = consider increasing ivf since it imay be due to fluid losses from diarrhea 2/2 lactulose, and acyclovir impacting kidneys.   Drug monitoring = will need to adjust acyclovir dosing to reflect new Cr/GFR  Drue [facility] for Infectious Diseases Cell: [phone] Pager: [phone]  12/08/2014, 5:23 PM

## 2014-12-08 NOTE — Progress Notes (Signed)
Per lab, CSF culture and gram stain can not be completed, sample was contaminated before being able to be tested.  If MD wishes for CSF culture and gram stain to be done, another sample will need to be completed.

## 2014-12-08 NOTE — Procedures (Signed)
ELECTROENCEPHALOGRAM REPORT  Patient: Gina Cox       Room #: 1231 EEG No. ID: [id number] Age: 59 y.o.        Sex: female Referring Physician: Kendrick Fries, D Report Date:  12/08/2014        Interpreting Physician: Aline Brochure  History: [name] is an 59 y.o. female found unresponsive at home, admitted with hypoglycemia and elevated ammonia. MRI 12/05/2014 showed no abnormality bilaterally involving body and tail of the hippocampus extending into the posterior cingulate gyrus. Patient has remained unresponsive since admission.  Indications for study:  Assess severity of encephalopathy; rule out seizure activity.  Technique: This is an 18 channel routine scalp EEG performed at the bedside with bipolar and monopolar montages arranged in accordance to the international 10/20 system of electrode placement.   Description: Patient was noted to be unresponsive, including to noxious stimuli EEG recording. Predominant background activity consisted of diffuse low to moderate amplitude continuous mixed irregular delta and theta activity. Considerable muscle artifact was also noted. Photic stimulation was not performed. No epileptiform discharges were recorded.  Interpretation: This EEG is abnormal with nonspecific moderately severe and tenuous slowing of cerebral activity diffusely consistent with patient's neuropathic state. Findings can be seen with metabolic and toxic encephalopathies as well as degenerative central nervous system disorders. No evidence of an epileptic disorder was demonstrated.   Venetia Maxon M.D. [facility] [phone]

## 2014-12-08 NOTE — Progress Notes (Signed)
Pharmacy - Vancomycin dosing  Assessment: 58 y.oF with PMH of DM, HTN, HLD who presents with hypoglycemia and AMS after being found unresponsive at home. Currently on abx for suspected meningitis. Scr increased noticeably from 1.02 -->1.52-->2.58-->3.11 today, Vancomycin order held on 3/24 due to elevated level and checking VT/SCr   SCr continues to rise, now 3.11 up from 2.58 on 3/25; CrCl 22.2 ml/min  Drug level / dose changes info: 3/23 2100 VT = 20.9 on 750 mg IV q12, changed to 500 mg q12h 3/24 2100 VT = 37.4 on 500 q12  (Vancomycin placed on hold) 3/25 2245 VT = 33.4   Goal of Therapy:  Vancomycin trough level 15-20 mcg/ml Eradication of infection Appropriate dosing of antibiotics for renal function and indication  Plan:   Continue to hold vancomycin.  Will check random vanc level tomorrow evening and reassess clearance at that time.  Clinical pharmacist may adjust trough time as appropriate based on tomorrow morning's SCr.  Continue ampicillin to 2g IV q8h  Continue acyclovir to 550 mg IV q24h   No renal adjustment needed for ceftriaxone  Terrilee Files, PharmD  12/08/2014, 11:56 PM

## 2014-12-08 NOTE — Progress Notes (Signed)
CARE MANAGEMENT NOTE 12/08/2014  Patient:  Gina Cox, Gina Cox   Account Number:  [id number]  Date Initiated:  12/05/2014  Documentation initiated by:  Pinkhasov,RHONDA  Subjective/Objective Assessment:   59 year old F brought to the [facility] ED on 12/04/2014 after being found down at home by daughter.  Last seen normal 1 day prior. Required intubation in the emergency department for airway protection     Action/Plan:   tbd   Anticipated DC Date:  12/11/2014   Anticipated DC Plan:  HOME/SELF CARE  In-house referral  Clinical Social Worker      DC Planning Services  CM consult      [facility] Choice  NA   Choice offered to / List presented to:  NA   DME arranged  NA           Status of service:  In process, will continue to follow Medicare Important Message given?   (If response is "NO", the following Medicare IM given date fields will be blank) Date Medicare IM given:   Medicare IM given by:   Date Additional Medicare IM given:   Additional Medicare IM given by:    Discharge Disposition:    Per UR Regulation:  Reviewed for med. necessity/level of care/duration of stay  If discussed at Long Length of Stay Meetings, dates discussed:    Comments:  December 08, 2014/Rhonda L. Earlene Plater, RN, BSN, CCM. Case Management [facility] [employer] [phone] No discharge needs present of time of review. remains intubated, responsvie not sedated.  eeg pending  December 05, 2014/Rhonda L. Earlene Plater, RN, BSN, CCM. Case Management [employer] [employer] [phone] No discharge needs present of time of review.

## 2014-12-09 ENCOUNTER — Other Ambulatory Visit: Payer: Self-pay

## 2014-12-09 ENCOUNTER — Inpatient Hospital Stay (HOSPITAL_COMMUNITY): Payer: Medicare Other

## 2014-12-09 DIAGNOSIS — G039 Meningitis, unspecified: Secondary | ICD-10-CM

## 2014-12-09 LAB — BLOOD GAS, ARTERIAL
Acid-base deficit: 7 mmol/L — ABNORMAL HIGH (ref 0.0–2.0)
Bicarbonate: 16.8 mEq/L — ABNORMAL LOW (ref 20.0–24.0)
Drawn by: 276051
FIO2: 0.3 %
O2 SAT: 98 %
PEEP: 5 cmH2O
PRESSURE SUPPORT: 5 cmH2O
Patient temperature: 98.6
TCO2: 15.8 mmol/L (ref 0–100)
pCO2 arterial: 29.4 mmHg — ABNORMAL LOW (ref 35.0–45.0)
pH, Arterial: 7.375 (ref 7.350–7.450)
pO2, Arterial: 128 mmHg — ABNORMAL HIGH (ref 80.0–100.0)

## 2014-12-09 LAB — GLUCOSE, CAPILLARY
GLUCOSE-CAPILLARY: 304 mg/dL — AB (ref 70–99)
GLUCOSE-CAPILLARY: 323 mg/dL — AB (ref 70–99)
Glucose-Capillary: 234 mg/dL — ABNORMAL HIGH (ref 70–99)
Glucose-Capillary: 245 mg/dL — ABNORMAL HIGH (ref 70–99)
Glucose-Capillary: 365 mg/dL — ABNORMAL HIGH (ref 70–99)

## 2014-12-09 LAB — RENAL FUNCTION PANEL
ANION GAP: 15 (ref 5–15)
Albumin: 2.4 g/dL — ABNORMAL LOW (ref 3.5–5.2)
BUN: 105 mg/dL — AB (ref 6–23)
CALCIUM: 8.3 mg/dL — AB (ref 8.4–10.5)
CO2: 18 mmol/L — AB (ref 19–32)
CREATININE: 4.7 mg/dL — AB (ref 0.50–1.10)
Chloride: 107 mmol/L (ref 96–112)
GFR calc Af Amer: 11 mL/min — ABNORMAL LOW (ref >90)
GFR calc non Af Amer: 9 mL/min — ABNORMAL LOW (ref >90)
Glucose, Bld: 375 mg/dL — ABNORMAL HIGH (ref 70–99)
POTASSIUM: 4.6 mmol/L (ref 3.5–5.1)
Phosphorus: 7 mg/dL — ABNORMAL HIGH (ref 2.3–4.6)
SODIUM: 140 mmol/L (ref 135–145)

## 2014-12-09 LAB — BASIC METABOLIC PANEL
Anion gap: 17 — ABNORMAL HIGH (ref 5–15)
BUN: 91 mg/dL — ABNORMAL HIGH (ref 6–23)
CALCIUM: 8.7 mg/dL (ref 8.4–10.5)
CO2: 17 mmol/L — ABNORMAL LOW (ref 19–32)
Chloride: 113 mmol/L — ABNORMAL HIGH (ref 96–112)
Creatinine, Ser: 4.54 mg/dL — ABNORMAL HIGH (ref 0.50–1.10)
GFR, EST AFRICAN AMERICAN: 11 mL/min — AB (ref >90)
GFR, EST NON AFRICAN AMERICAN: 10 mL/min — AB (ref >90)
Glucose, Bld: 243 mg/dL — ABNORMAL HIGH (ref 70–99)
POTASSIUM: 3.2 mmol/L — AB (ref 3.5–5.1)
Sodium: 147 mmol/L — ABNORMAL HIGH (ref 135–145)

## 2014-12-09 LAB — MAGNESIUM: Magnesium: 3 mg/dL — ABNORMAL HIGH (ref 1.5–2.5)

## 2014-12-09 LAB — CBC
HEMATOCRIT: 24 % — AB (ref 36.0–46.0)
Hemoglobin: 8 g/dL — ABNORMAL LOW (ref 12.0–15.0)
MCH: 30.9 pg (ref 26.0–34.0)
MCHC: 33.3 g/dL (ref 30.0–36.0)
MCV: 92.7 fL (ref 78.0–100.0)
Platelets: 209 10*3/uL (ref 150–400)
RBC: 2.59 MIL/uL — ABNORMAL LOW (ref 3.87–5.11)
RDW: 14.8 % (ref 11.5–15.5)
WBC: 16.6 10*3/uL — ABNORMAL HIGH (ref 4.0–10.5)

## 2014-12-09 LAB — VANCOMYCIN, TROUGH: VANCOMYCIN TR: 26.9 ug/mL — AB (ref 10.0–20.0)

## 2014-12-09 LAB — PHOSPHORUS: PHOSPHORUS: 5.8 mg/dL — AB (ref 2.3–4.6)

## 2014-12-09 LAB — CK: Total CK: 104 U/L (ref 7–177)

## 2014-12-09 MED ORDER — RANITIDINE HCL 150 MG/10ML PO SYRP
150.0000 mg | ORAL_SOLUTION | Freq: Every day | ORAL | Status: DC
  Filled 2014-12-09: qty 10

## 2014-12-09 MED ORDER — LACTULOSE 10 GM/15ML PO SOLN
20.0000 g | Freq: Every day | ORAL | Status: DC
  Administered 2014-12-09 – 2014-12-26 (×17): 20 g
  Filled 2014-12-09 (×19): qty 30

## 2014-12-09 MED ORDER — PANTOPRAZOLE SODIUM 40 MG PO PACK
40.0000 mg | PACK | ORAL | Status: DC
  Administered 2014-12-09 – 2014-12-27 (×18): 40 mg
  Filled 2014-12-09 (×20): qty 20

## 2014-12-09 MED ORDER — SODIUM BICARBONATE 8.4 % IV SOLN
INTRAVENOUS | Status: DC
  Administered 2014-12-09 – 2014-12-11 (×4): via INTRAVENOUS
  Filled 2014-12-09 (×8): qty 150

## 2014-12-09 NOTE — Progress Notes (Signed)
CRITICAL VALUE ALERT  Critical value received: vancomycin level: 26.9  Date of notification:  12/09/2014  Time of notification:  2345  Critical value read back:Yes.    Nurse who received alert:  Effie Berkshire  MD notified (1st page):  Katrinka Blazing  Time of first page:  1147  MD notified (2nd page):  Time of second page:  Responding MD:  Katrinka Blazing  Time MD responded:  1150

## 2014-12-09 NOTE — Progress Notes (Signed)
PULMONARY / CRITICAL CARE MEDICINE   Name: Gina Cox MRN: [id number] DOB: 01-19-1956    ADMISSION DATE:  12/04/2014  REFERRING MD :  Madilyn Hook EDP  CHIEF COMPLAINT:  Found down  INITIAL PRESENTATION:  59 yo female found unresponsive by family at home.  CBG 54 in ER, and intubated for airway protection >> Difficult airway.  STUDIES:  3/21 CT head >> no acute intracranial process 3/22 MRI Brain >> hippocampal diffusion signal abnormal to the posterior singulate gyrus  3/22 EEG >> no seizure activity 3/22 LP >> glucose 96, protein 42, WBC 128 (82%N), RBC 13 3/25 EEG >> toxic/metabolic encephalopathy.  No seizure activity  SIGNIFICANT EVENTS: 3/21 Admit 3/22 Neurology consulted 3/24 ID consulted  SUBJECTIVE:  Tolerating pressure support.  VITAL SIGNS: Temp:  [97.9 F (36.6 C)-98.9 F (37.2 C)] 97.9 F (36.6 C) (03/26 0400) Pulse Rate:  [74-109] 91 (03/26 0822) Resp:  [13-28] 17 (03/26 0822) BP: (134-218)/(47-81) 150/54 mmHg (03/26 0822) SpO2:  [97 %-100 %] 100 % (03/26 0822) FiO2 (%):  [30 %] 30 % (03/26 0822) Weight:  [229 lb 0.9 oz (103.9 kg)] 229 lb 0.9 oz (103.9 kg) (03/26 0300) VENTILATOR SETTINGS: Vent Mode:  [-] PSV;CPAP FiO2 (%):  [30 %] 30 % Set Rate:  [14 bmp] 14 bmp Vt Set:  [440 mL] 440 mL PEEP:  [5 cmH20] 5 cmH20 Pressure Support:  [5 cmH20-10 cmH20] 5 cmH20 Plateau Pressure:  [15 cmH20-22 cmH20] 15 cmH20 INTAKE / OUTPUT:  Intake/Output Summary (Last 24 hours) at 12/09/14 0839 Last data filed at 12/09/14 0800  Gross per 24 hour  Intake   3030 ml  Output   1092 ml  Net   1938 ml   PHYSICAL EXAMINATION: General: no distress Neuro: opens eyes with stimulation, no following commands HEENT: ETT in place Cardiovascular: regular Lungs: scattered rhonchi Abdomen: soft, non tender Musculoskeletal: 2+ edema Skin: no rashes  LABS:  CBC  Recent Labs Lab 12/07/14 0525 12/08/14 0515 12/09/14 0458  WBC 10.4 10.2 16.6*  HGB 7.9* 7.6* 8.0*  HCT  24.4* 23.2* 24.0*  PLT 167 181 209   Coag's  Recent Labs Lab 12/04/14 2031  INR 0.97   BMET  Recent Labs Lab 12/07/14 2100 12/08/14 0515 12/09/14 0458  NA 147* 145 147*  K 3.3* 3.4* 3.2*  CL 114* 114* 113*  CO2 21 19 17*  BUN 43* 57* 91*  CREATININE 2.58* 3.11* 4.54*  GLUCOSE 208* 234* 243*   Electrolytes  Recent Labs Lab 12/07/14 0525 12/07/14 2100 12/08/14 0515 12/09/14 0458  CALCIUM 8.2* 8.5 8.3* 8.7  MG 3.1*  --  3.1* 3.0*  PHOS 3.3  --  5.0* 5.8*   Sepsis Markers  Recent Labs Lab 12/04/14 2039 12/05/14 0145 12/05/14 0216  LATICACIDVEN 1.34 1.8  --   PROCALCITON  --   --  <0.10   ABG  Recent Labs Lab 12/06/14 0730 12/07/14 0500 12/08/14 0432  PHART 7.420 7.405 7.355  PCO2ART 33.4* 32.9* 34.6*  PO2ART 152.0* 145.0* 127.0*   Liver Enzymes  Recent Labs Lab 12/04/14 2031  AST 25  ALT 13  ALKPHOS 73  BILITOT 0.4  ALBUMIN 3.8   Cardiac Enzymes  Recent Labs Lab 12/05/14 0145  TROPONINI <0.03   Glucose  Recent Labs Lab 12/08/14 0813 12/08/14 1149 12/08/14 1555 12/08/14 2015 12/08/14 2319 12/09/14 0439  GLUCAP 232* 231* 240* 266* 304* 234*   Imaging Dg Chest Port 1 View  12/08/2014   CLINICAL DATA:  Intubation.  EXAM: PORTABLE  CHEST - 1 VIEW  COMPARISON:  12/07/2014.  FINDINGS: Endotracheal tube, left IJ line, NG tube in good anatomic position. Heart size normal. Mild basilar atelectasis. No pleural effusion or pneumothorax.  IMPRESSION: 1. Lines and tubes stable position. 2. Mild basilar atelectasis.   Electronically Signed   By: Maisie Fus  Register   On: 12/08/2014 07:29   ASSESSMENT / PLAN:  NEUROLOGIC A:   Acute encephalopathy >> concern for HSV encephalitis, meningitis, metabolic (hypoglycemia on admission, elevated ammonia, AKI). Hx of ETOH, cocaine abuse. P:   Monitor mental status RASS goal 0 F/u anti-NMDAR, anti-AMPA Continue lactulose Thiamine, folic acid  PULMONARY OETT 4/54 >>> A:  Acute respiratory  failure secondary to inability to protect airway >> difficult airway. Tobacco abuse. P:   Pressure support wean as tolerated >> mental status prevents extubation trial F/u CXR, ABG  CARDIOVASCULAR A: Hx HTN, HLD. P:  Continue norvasc, metoprolol Hold outpt lisinopril, simvastatin, nebivolol  RENAL A:  AKI. Gap/non gap metabolic acidosis. Hypokalemia. P:   Renal dose adjust meds Add HCO3 to IV fluids Check FeNa Replace electrolytes as needed F/u BMET, ABG If no improvement in renal fx and urine outpt decrease, then will need nephrology evaluation  GASTROINTESTINAL A:  Nutrition. Diarrhea >> C diff negative 3/24. P:   TF on vent Protonix  HEMATOLOGIC A:  Anemia of critical illness. P:  F/u CBC SQ heparin for DVT prevention  INFECTIOUS A:   Possible HSV encephalitis, meningitis. P:   Day 6 of acyclovir, ampicillin, rocephin, vancomycin per ID Continue decadron  Blood 3/21 >>   ENDOCRINE A:  DM with renal complications. P:   SSI, lantus Hold outpt pioglitazone, metformin  CC time 40 minutes.  Coralyn Helling, MD [facility] Pulmonary/Critical Care 12/09/2014, 8:57 AM Pager:  [phone] After 3pm call: [phone]

## 2014-12-09 NOTE — Progress Notes (Signed)
ETT pulled back 2cm per MD order. Pt tolerated well. RN at bedside.

## 2014-12-09 NOTE — Progress Notes (Signed)
While checking the Pt's ventilator and assessing the Pt, it came to my attention that the Pt's ETT was located at 18cm @ the lips. I switched the Pt over from PS/CPAP to full vent support on her noted settings.  Immediately I noticed that the Pt was not returning her inhaled tidal volumes and become very agitated and asynchronous with the ventilator.  I received in report that per MD orders, the Pt's ETT had been moved from 25cm at the lips to 23cm at the lips.  I suspected that the ETT had moved.  I immediately called Chryl Heck, RRT, RCP and upon examining the Pt, she felt the same.  Tabatha and I moved the tube to 23cm at the Pt's lips and rechecked the Pt.  The Pt immediately became synchronous with the vent, began returning tidal volumes, and equal bilateral breath sounds were heard.  Pt resting comfortably and stable.

## 2014-12-09 NOTE — Progress Notes (Signed)
Call received from Radiologist informing that ETT should be retracted 2 cm and that xray shows ? Edema. Will inform CCMD and RT. Maguadalupe Lata, Georga Hacking, RN

## 2014-12-09 NOTE — Progress Notes (Signed)
CRITICAL VALUE ALERT  Critical value received:  vanc Tr 33.4  Date of notification:  12/08/14  Time of notification:  2345  Critical value read back:Yes.    Nurse who received alert:  Laurence Aly RN  MD notified (1st page): E-link  Time of first page: 0018  MD notified (2nd page):  Time of second page:  Responding MD: Dr Katrinka Blazing  Time MD responded:  Awaiting response

## 2014-12-09 NOTE — Progress Notes (Signed)
[facility]  Date of Admission:  12/04/2014  Antibiotics: Vanco/ceftriaxone/amp/acyclovir  Subjective: No response  Objective: Temp:  [97.9 F (36.6 C)-98.9 F (37.2 C)] 97.9 F (36.6 C) (03/26 0400) Pulse Rate:  [74-109] 82 (03/26 1000) Resp:  [13-28] 13 (03/26 1000) BP: (134-218)/(51-81) 164/65 mmHg (03/26 1000) SpO2:  [97 %-100 %] 100 % (03/26 1000) FiO2 (%):  [30 %] 30 % (03/26 0822) Weight:  [229 lb 0.9 oz (103.9 kg)] 229 lb 0.9 oz (103.9 kg) (03/26 0300)  General: not responsive Skin: no rashes Lungs: CTA B, on vent Cor: tachy RR Abdomen: soft, nt, nd Ext: no edema  Lab Results Lab Results  Component Value Date   WBC 16.6* 12/09/2014   HGB 8.0* 12/09/2014   HCT 24.0* 12/09/2014   MCV 92.7 12/09/2014   PLT 209 12/09/2014    Lab Results  Component Value Date   CREATININE 4.54* 12/09/2014   BUN 91* 12/09/2014   NA 147* 12/09/2014   K 3.2* 12/09/2014   CL 113* 12/09/2014   CO2 17* 12/09/2014    Lab Results  Component Value Date   ALT 13 12/04/2014   AST 25 12/04/2014   ALKPHOS 73 12/04/2014   BILITOT 0.4 12/04/2014      Microbiology: Recent Results (from the past 240 hour(s))  Blood Culture (routine x 2)     Status: None (Preliminary result)   Collection Time: 12/04/14  8:10 PM  Result Value Ref Range Status   Specimen Description BLOOD RIGHT WRIST  Final   Special Requests BOTTLES DRAWN AEROBIC AND ANAEROBIC 5CC EACH  Final   Culture   Final           BLOOD CULTURE RECEIVED NO GROWTH TO DATE CULTURE WILL BE HELD FOR 5 DAYS BEFORE ISSUING A FINAL NEGATIVE REPORT Performed at [facility]    Report Status PENDING  Incomplete  Blood Culture (routine x 2)     Status: None (Preliminary result)   Collection Time: 12/04/14  8:11 PM  Result Value Ref Range Status   Specimen Description BLOOD LEFT ARM  Final   Special Requests BOTTLES DRAWN AEROBIC AND ANAEROBIC 5CC EACH  Final   Culture   Final           BLOOD  CULTURE RECEIVED NO GROWTH TO DATE CULTURE WILL BE HELD FOR 5 DAYS BEFORE ISSUING A FINAL NEGATIVE REPORT Performed at [facility]    Report Status PENDING  Incomplete  Urine culture     Status: None   Collection Time: 12/04/14  9:31 PM  Result Value Ref Range Status   Specimen Description URINE, CLEAN CATCH  Final   Special Requests NONE  Final   Colony Count   Final    >=100,000 COLONIES/ML Performed at [facility]    Culture   Final    Multiple bacterial morphotypes present, none predominant. Suggest appropriate recollection if clinically indicated. Performed at [facility]    Report Status 12/05/2014 FINAL  Final  MRSA PCR Screening     Status: None   Collection Time: 12/05/14  4:23 AM  Result Value Ref Range Status   MRSA by PCR NEGATIVE NEGATIVE Final    Comment:        The GeneXpert MRSA Assay (FDA approved for NASAL specimens only), is one component of a comprehensive MRSA colonization surveillance program. It is not intended to diagnose MRSA infection nor to guide or monitor treatment for MRSA infections.  Clostridium Difficile by PCR     Status: None   Collection Time: 12/07/14  4:28 PM  Result Value Ref Range Status   C difficile by pcr NEGATIVE NEGATIVE Final    Studies/Results: Dg Chest Port 1 View  12/09/2014   CLINICAL DATA:  Acute onset of respiratory failure. Subsequent encounter.  EXAM: PORTABLE CHEST - 1 VIEW  COMPARISON:  Chest radiograph performed 12/08/2014  FINDINGS: The patient's endotracheal tube is seen ending just under 1 cm above the carina. This could be retracted 2 cm.  The left IJ line is noted ending overlying the mid to distal SVC. The enteric tube is noted extending below the diaphragm.  Vascular congestion is again noted, with mildly increased interstitial markings, suggestive of mild interstitial edema. No pleural effusion or pneumothorax is seen.  The cardiomediastinal silhouette is borderline enlarged. No acute  osseous abnormalities are identified.  IMPRESSION: 1. Endotracheal tube seen ending just under 1 cm above the carina. This could be retracted 2 cm. 2. Vascular congestion again noted, with mildly increased interstitial markings, suggestive of mild interstitial edema, given the patient's symptoms. Borderline cardiomegaly.  These results were called by telephone at the time of interpretation on 12/09/2014 at 7:31 am to Sandie Ano RN at the [facility], who verbally acknowledged these results.   Electronically Signed   By: Roanna Raider M.D.   On: 12/09/2014 07:32   Dg Chest Port 1 View  12/08/2014   CLINICAL DATA:  Intubation.  EXAM: PORTABLE CHEST - 1 VIEW  COMPARISON:  12/07/2014.  FINDINGS: Endotracheal tube, left IJ line, NG tube in good anatomic position. Heart size normal. Mild basilar atelectasis. No pleural effusion or pneumothorax.  IMPRESSION: 1. Lines and tubes stable position. 2. Mild basilar atelectasis.   Electronically Signed   By: Maisie Fus  Register   On: 12/08/2014 07:29    Assessment/Plan:  1) Meningitis - likely bacterial due to severity, neutrophil predominance, though with MRI findings, non HSV encephalitis also possible.   -HSV negative so will dc acyclovir -no evidence of listeria so will d/c ampicillin  Staci Righter, MD [email or web address] [phone] pager   [phone] cell 12/09/2014, 10:42 AM

## 2014-12-10 ENCOUNTER — Inpatient Hospital Stay (HOSPITAL_COMMUNITY): Payer: Medicare Other

## 2014-12-10 LAB — BLOOD GAS, ARTERIAL
ACID-BASE DEFICIT: 3.8 mmol/L — AB (ref 0.0–2.0)
Bicarbonate: 19.4 mEq/L — ABNORMAL LOW (ref 20.0–24.0)
Drawn by: 422461
FIO2: 0.3 %
O2 SAT: 98.7 %
PCO2 ART: 29.4 mmHg — AB (ref 35.0–45.0)
PEEP: 5 cmH2O
PO2 ART: 135 mmHg — AB (ref 80.0–100.0)
Patient temperature: 97.8
RATE: 14 resp/min
TCO2: 18.2 mmol/L (ref 0–100)
VT: 440 mL
pH, Arterial: 7.433 (ref 7.350–7.450)

## 2014-12-10 LAB — BASIC METABOLIC PANEL
ANION GAP: 15 (ref 5–15)
BUN: 120 mg/dL — ABNORMAL HIGH (ref 6–23)
CHLORIDE: 107 mmol/L (ref 96–112)
CO2: 23 mmol/L (ref 19–32)
CREATININE: 4.47 mg/dL — AB (ref 0.50–1.10)
Calcium: 8.3 mg/dL — ABNORMAL LOW (ref 8.4–10.5)
GFR calc Af Amer: 11 mL/min — ABNORMAL LOW (ref >90)
GFR calc non Af Amer: 10 mL/min — ABNORMAL LOW (ref >90)
Glucose, Bld: 334 mg/dL — ABNORMAL HIGH (ref 70–99)
POTASSIUM: 2.9 mmol/L — AB (ref 3.5–5.1)
SODIUM: 145 mmol/L (ref 135–145)

## 2014-12-10 LAB — GLUCOSE, CAPILLARY
GLUCOSE-CAPILLARY: 225 mg/dL — AB (ref 70–99)
GLUCOSE-CAPILLARY: 281 mg/dL — AB (ref 70–99)
GLUCOSE-CAPILLARY: 283 mg/dL — AB (ref 70–99)
GLUCOSE-CAPILLARY: 285 mg/dL — AB (ref 70–99)
Glucose-Capillary: 284 mg/dL — ABNORMAL HIGH (ref 70–99)

## 2014-12-10 LAB — CBC
HCT: 25 % — ABNORMAL LOW (ref 36.0–46.0)
HEMOGLOBIN: 8.2 g/dL — AB (ref 12.0–15.0)
MCH: 30 pg (ref 26.0–34.0)
MCHC: 32.8 g/dL (ref 30.0–36.0)
MCV: 91.6 fL (ref 78.0–100.0)
PLATELETS: 215 10*3/uL (ref 150–400)
RBC: 2.73 MIL/uL — AB (ref 3.87–5.11)
RDW: 14.8 % (ref 11.5–15.5)
WBC: 15.7 10*3/uL — ABNORMAL HIGH (ref 4.0–10.5)

## 2014-12-10 LAB — RENAL FUNCTION PANEL
ALBUMIN: 2.4 g/dL — AB (ref 3.5–5.2)
Anion gap: 14 (ref 5–15)
BUN: 105 mg/dL — ABNORMAL HIGH (ref 6–23)
CALCIUM: 8.3 mg/dL — AB (ref 8.4–10.5)
CHLORIDE: 110 mmol/L (ref 96–112)
CO2: 21 mmol/L (ref 19–32)
Creatinine, Ser: 4.72 mg/dL — ABNORMAL HIGH (ref 0.50–1.10)
GFR calc Af Amer: 11 mL/min — ABNORMAL LOW (ref >90)
GFR, EST NON AFRICAN AMERICAN: 9 mL/min — AB (ref >90)
Glucose, Bld: 306 mg/dL — ABNORMAL HIGH (ref 70–99)
POTASSIUM: 3 mmol/L — AB (ref 3.5–5.1)
Phosphorus: 6.1 mg/dL — ABNORMAL HIGH (ref 2.3–4.6)
SODIUM: 145 mmol/L (ref 135–145)

## 2014-12-10 LAB — HEPATIC FUNCTION PANEL
ALT: 39 U/L — ABNORMAL HIGH (ref 0–35)
AST: 34 U/L (ref 0–37)
Albumin: 2.5 g/dL — ABNORMAL LOW (ref 3.5–5.2)
Alkaline Phosphatase: 72 U/L (ref 39–117)
Bilirubin, Direct: <0.1 mg/dL (ref 0.0–0.5)
Total Bilirubin: 0.2 mg/dL — ABNORMAL LOW (ref 0.3–1.2)
Total Protein: 6.4 g/dL (ref 6.0–8.3)

## 2014-12-10 LAB — VANCOMYCIN, RANDOM: Vancomycin Rm: 20.1 ug/mL

## 2014-12-10 MED ORDER — METOPROLOL TARTRATE 25 MG/10 ML ORAL SUSPENSION
25.0000 mg | Freq: Two times a day (BID) | ORAL | Status: DC
  Administered 2014-12-10 – 2014-12-11 (×3): 25 mg
  Filled 2014-12-10 (×4): qty 10

## 2014-12-10 MED ORDER — POTASSIUM CHLORIDE 20 MEQ/15ML (10%) PO SOLN
10.0000 meq | Freq: Once | ORAL | Status: AC
  Administered 2014-12-10: 10 meq
  Filled 2014-12-10: qty 15

## 2014-12-10 MED ORDER — INSULIN GLARGINE 100 UNIT/ML ~~LOC~~ SOLN
15.0000 [IU] | Freq: Two times a day (BID) | SUBCUTANEOUS | Status: DC
  Administered 2014-12-10 – 2014-12-11 (×4): 15 [IU] via SUBCUTANEOUS
  Filled 2014-12-10 (×5): qty 0.15

## 2014-12-10 NOTE — Progress Notes (Signed)
ANTIBIOTIC CONSULT NOTE - FOLLOW UP  Pharmacy Consult for rocephin, vancomycin Indication: suspected meningitis  No Known Allergies  Patient Measurements: Height: 5\' 4"  (162.6 cm) Weight: 234 lb 9.1 oz (106.4 kg) IBW/kg (Calculated) : 54.7  Vital Signs: Temp: 97.8 F (36.6 C) (03/27 2039) Temp Source: Axillary (03/27 2039) BP: 169/65 mmHg (03/27 2200) Pulse Rate: 85 (03/27 2200) Intake/Output from previous day: 03/26 0701 - 03/27 0700 In: 4428.8 [I.V.:2768.8; NG/GT:1560; IV Piggyback:100] Out: 1499 [Urine:599; Stool:900] Intake/Output from this shift: Total I/O In: 440 [I.V.:270; NG/GT:120; IV Piggyback:50] Out: 600 [Urine:600]  Labs:  Recent Labs  12/08/14 0515 12/09/14 0458 12/09/14 1737 12/10/14 0315 12/10/14 1450  WBC 10.2 16.6*  --  15.7*  --   HGB 7.6* 8.0*  --  8.2*  --   PLT 181 209  --  215  --   CREATININE 3.11* 4.54* 4.70* 4.72* 4.47*   Estimated Creatinine Clearance: 15.9 mL/min (by C-G formula based on Cr of 4.47).  Recent Labs  12/08/14 2245 12/09/14 2246 12/10/14 2208  VANCOTROUGH 33.4* 26.9*  --   VANCORANDOM  --   --  20.1     Microbiology: Recent Results (from the past 720 hour(s))  Blood Culture (routine x 2)     Status: None (Preliminary result)   Collection Time: 12/04/14  8:10 PM  Result Value Ref Range Status   Specimen Description BLOOD RIGHT WRIST  Final   Special Requests BOTTLES DRAWN AEROBIC AND ANAEROBIC 5CC EACH  Final   Culture   Final           BLOOD CULTURE RECEIVED NO GROWTH TO DATE CULTURE WILL BE HELD FOR 5 DAYS BEFORE ISSUING A FINAL NEGATIVE REPORT Performed at [facility]    Report Status PENDING  Incomplete  Blood Culture (routine x 2)     Status: None (Preliminary result)   Collection Time: 12/04/14  8:11 PM  Result Value Ref Range Status   Specimen Description BLOOD LEFT ARM  Final   Special Requests BOTTLES DRAWN AEROBIC AND ANAEROBIC 5CC EACH  Final   Culture   Final           BLOOD CULTURE  RECEIVED NO GROWTH TO DATE CULTURE WILL BE HELD FOR 5 DAYS BEFORE ISSUING A FINAL NEGATIVE REPORT Performed at [facility]    Report Status PENDING  Incomplete  Urine culture     Status: None   Collection Time: 12/04/14  9:31 PM  Result Value Ref Range Status   Specimen Description URINE, CLEAN CATCH  Final   Special Requests NONE  Final   Colony Count   Final    >=100,000 COLONIES/ML Performed at [facility]    Culture   Final    Multiple bacterial morphotypes present, none predominant. Suggest appropriate recollection if clinically indicated. Performed at [facility]    Report Status 12/05/2014 FINAL  Final  MRSA PCR Screening     Status: None   Collection Time: 12/05/14  4:23 AM  Result Value Ref Range Status   MRSA by PCR NEGATIVE NEGATIVE Final    Comment:        The GeneXpert MRSA Assay (FDA approved for NASAL specimens only), is one component of a comprehensive MRSA colonization surveillance program. It is not intended to diagnose MRSA infection nor to guide or monitor treatment for MRSA infections.   Clostridium Difficile by PCR     Status: None   Collection Time: 12/07/14  4:28 PM  Result Value  Ref Range Status   C difficile by pcr NEGATIVE NEGATIVE Final    Anti-infectives    Start     Dose/Rate Route Frequency Ordered Stop   12/08/14 1800  acyclovir (ZOVIRAX) 550 mg in dextrose 5 % 100 mL IVPB  Status:  Discontinued     550 mg 111 mL/hr over 60 Minutes Intravenous Every 24 hours 12/07/14 2243 12/09/14 1053   12/08/14 0600  ampicillin (OMNIPEN) 2 g in sodium chloride 0.9 % 50 mL IVPB  Status:  Discontinued     2 g 150 mL/hr over 20 Minutes Intravenous 3 times per day 12/07/14 2243 12/09/14 1053   12/07/14 1800  acyclovir (ZOVIRAX) 550 mg in dextrose 5 % 100 mL IVPB  Status:  Discontinued     550 mg 111 mL/hr over 60 Minutes Intravenous Every 12 hours 12/07/14 1253 12/07/14 2243   12/07/14 1400  ampicillin (OMNIPEN) 2 g in sodium  chloride 0.9 % 50 mL IVPB  Status:  Discontinued     2 g 150 mL/hr over 20 Minutes Intravenous Every 6 hours 12/07/14 1253 12/07/14 2243   12/07/14 1000  vancomycin (VANCOCIN) 500 mg in sodium chloride 0.9 % 100 mL IVPB  Status:  Discontinued     500 mg 100 mL/hr over 60 Minutes Intravenous Every 12 hours 12/06/14 2227 12/07/14 1255   12/05/14 2000  cefTRIAXone (ROCEPHIN) 2 g in dextrose 5 % 50 mL IVPB  Status:  Discontinued     2 g 100 mL/hr over 30 Minutes Intravenous Every 24 hours 12/04/14 2022 12/05/14 0051   12/05/14 1000  vancomycin (VANCOCIN) IVPB 750 mg/150 ml premix     750 mg 150 mL/hr over 60 Minutes Intravenous Every 12 hours 12/05/14 0105 12/06/14 2329   12/05/14 0800  cefTRIAXone (ROCEPHIN) 2 g in dextrose 5 % 50 mL IVPB - Premix     2 g 100 mL/hr over 30 Minutes Intravenous Every 12 hours 12/05/14 0105     12/05/14 0115  ampicillin (OMNIPEN) 2 g in sodium chloride 0.9 % 50 mL IVPB  Status:  Discontinued     2 g 150 mL/hr over 20 Minutes Intravenous 6 times per day 12/05/14 0105 12/07/14 1253   12/05/14 0115  acyclovir (ZOVIRAX) 550 mg in dextrose 5 % 100 mL IVPB  Status:  Discontinued     550 mg 111 mL/hr over 60 Minutes Intravenous 3 times per day 12/05/14 0107 12/07/14 1253   12/04/14 2300  vancomycin (VANCOCIN) 2,000 mg in sodium chloride 0.9 % 500 mL IVPB  Status:  Discontinued     2,000 mg 250 mL/hr over 120 Minutes Intravenous  Once 12/04/14 2205 12/05/14 0051   12/04/14 2015  cefTRIAXone (ROCEPHIN) 2 g in dextrose 5 % 50 mL IVPB     2 g 100 mL/hr over 30 Minutes Intravenous  Once 12/04/14 2009 12/04/14 2100      Assessment: 58 y.oF with PMH of DM, HTN, HLD who presents with hypoglycemia and AMS after being found unresponsive at home.  He's currently on abx for suspected meningitis.  Scr increased noticeably from 1.02 to 1.52 today.  3/21 >> Ceftriaxone >> 3/21 >> vanc >> 3/22 >> ampicillin >> 3/26 3/22 >> acyclovir >> 3/26  Tmax: afebrile WBCs:  15.7 Renal: SCr 4.47, CrCl 15 ml/min, 15N  3/21 blood x 2: sent 3/21 urine: NG (final) 3/23 CSF: high gluc, nml protein, high WBC; non-cloudy 3/22 HIV/RPR both negative 3/22 VDRL CSF: IP  3/23 LP: glucose 96 (slightly high), protein  42, wbc high 128, neutrophils high 82  Drug level / dose changes info: 3/23 2100 VT = 20.9 on 750 mg IV q12, changed to 500 mg q12h 3/24 2100 VT = 37.4 (Vanc held) 3/25 2245 Vanc level = 33.4 (Vanc held) 3/26 2246 Vanc level = 26.9 (Vanc held) 3.27 2200 Vanc level = 20.1    Goal of Therapy:  Vancomycin trough level 15-20 mcg/ml  Plan:  - continue ceftriaxone 2gm IV q12h - Vancomycin level continues to trend down; however given renal function will wait to dose again until Vanc level < 20 mcg/ml - Will check Vancomycin level with AM labs on 3/28 and will re-dose if level < 20 mcg/ml - f/u renal funct - F/u recommendations from ID    Emanuelle Hammerstrom, Joselyn Glassman, PharmD 12/10/2014,11:31 PM

## 2014-12-10 NOTE — Progress Notes (Signed)
eLink Physician-Brief Progress Note Patient Name: Gina Cox DOB: 04/20/1956 MRN: [id number]   Date of Service  12/10/2014  HPI/Events of Note  K+ = 2.9 and Creatinine = 4.47.  eICU Interventions  Will order KCl oral solution 10 meq via enteral tube now X 1.      Intervention Category Intermediate Interventions: Electrolyte abnormality - evaluation and management  Terecia Plaut Eugene 12/10/2014, 8:15 PM

## 2014-12-10 NOTE — Progress Notes (Signed)
PULMONARY / CRITICAL CARE MEDICINE   Name: Gina Cox MRN: [id number] DOB: June 12, 1956    ADMISSION DATE:  12/04/2014  REFERRING MD :  Madilyn Hook EDP  CHIEF COMPLAINT:  Found down  INITIAL PRESENTATION:  59 yo female found unresponsive by family at home.  CBG 54 in ER, and intubated for airway protection >> Difficult airway.  STUDIES:  3/21 CT head >> no acute intracranial process 3/22 MRI Brain >> hippocampal diffusion signal abnormal to the posterior singulate gyrus  3/22 EEG >> no seizure activity 3/22 LP >> glucose 96, protein 42, WBC 128 (82%N), RBC 13 3/25 EEG >> toxic/metabolic encephalopathy.  No seizure activity  SIGNIFICANT EVENTS: 3/21 Admit 3/22 Neurology consulted 3/24 ID consulted 3/26 acyclovir, ampicillin, decadron d/c'ed  SUBJECTIVE:  Tolerating pressure support.  VITAL SIGNS: Temp:  [97.8 F (36.6 C)-97.9 F (36.6 C)] 97.9 F (36.6 C) (03/27 0400) Pulse Rate:  [73-97] 73 (03/27 0700) Resp:  [12-25] 13 (03/27 0700) BP: (128-173)/(51-81) 159/56 mmHg (03/27 0700) SpO2:  [97 %-100 %] 98 % (03/27 0700) FiO2 (%):  [30 %] 30 % (03/27 0354) Weight:  [234 lb 9.1 oz (106.4 kg)] 234 lb 9.1 oz (106.4 kg) (03/27 0413) VENTILATOR SETTINGS: Vent Mode:  [-] PRVC FiO2 (%):  [30 %] 30 % Set Rate:  [14 bmp] 14 bmp Vt Set:  [440 mL] 440 mL PEEP:  [5 cmH20] 5 cmH20 Pressure Support:  [5 cmH20] 5 cmH20 Plateau Pressure:  [13 cmH20-16 cmH20] 13 cmH20 INTAKE / OUTPUT:  Intake/Output Summary (Last 24 hours) at 12/10/14 0910 Last data filed at 12/10/14 0700  Gross per 24 hour  Intake 4148.75 ml  Output   1487 ml  Net 2661.75 ml   PHYSICAL EXAMINATION: General: no distress Neuro: opens eyes with stimulation, no following commands, resists passive movements HEENT: ETT in place Cardiovascular: regular Lungs: scattered rhonchi Abdomen: soft, non tender Musculoskeletal: 2+ edema Skin: no rashes  LABS:  CBC  Recent Labs Lab 12/08/14 0515 12/09/14 0458  12/10/14 0315  WBC 10.2 16.6* 15.7*  HGB 7.6* 8.0* 8.2*  HCT 23.2* 24.0* 25.0*  PLT 181 209 215   Coag's  Recent Labs Lab 12/04/14 2031  INR 0.97   BMET  Recent Labs Lab 12/09/14 0458 12/09/14 1737 12/10/14 0315  NA 147* 140 145  K 3.2* 4.6 3.0*  CL 113* 107 110  CO2 17* 18* 21  BUN 91* 105* 105*  CREATININE 4.54* 4.70* 4.72*  GLUCOSE 243* 375* 306*   Electrolytes  Recent Labs Lab 12/07/14 0525  12/08/14 0515 12/09/14 0458 12/09/14 1737 12/10/14 0315  CALCIUM 8.2*  < > 8.3* 8.7 8.3* 8.3*  MG 3.1*  --  3.1* 3.0*  --   --   PHOS 3.3  --  5.0* 5.8* 7.0* 6.1*  < > = values in this interval not displayed. Sepsis Markers  Recent Labs Lab 12/04/14 2039 12/05/14 0145 12/05/14 0216  LATICACIDVEN 1.34 1.8  --   PROCALCITON  --   --  <0.10   ABG  Recent Labs Lab 12/08/14 0432 12/09/14 1700 12/10/14 0400  PHART 7.355 7.375 7.433  PCO2ART 34.6* 29.4* 29.4*  PO2ART 127.0* 128.0* 135.0*   Liver Enzymes  Recent Labs Lab 12/04/14 2031 12/09/14 1737 12/10/14 0315  AST 25  --  34  ALT 13  --  39*  ALKPHOS 73  --  72  BILITOT 0.4  --  0.2*  ALBUMIN 3.8 2.4* 2.4*  2.5*   Cardiac Enzymes  Recent Labs Lab 12/05/14  0145  TROPONINI <0.03   Glucose  Recent Labs Lab 12/09/14 0853 12/09/14 1720 12/09/14 2115 12/10/14 0041 12/10/14 0348 12/10/14 0831  GLUCAP 245* 365* 323* 285* 281* 284*   Imaging Dg Chest Port 1 View  12/09/2014   CLINICAL DATA:  Acute onset of respiratory failure. Subsequent encounter.  EXAM: PORTABLE CHEST - 1 VIEW  COMPARISON:  Chest radiograph performed 12/08/2014  FINDINGS: The patient's endotracheal tube is seen ending just under 1 cm above the carina. This could be retracted 2 cm.  The left IJ line is noted ending overlying the mid to distal SVC. The enteric tube is noted extending below the diaphragm.  Vascular congestion is again noted, with mildly increased interstitial markings, suggestive of mild interstitial edema. No  pleural effusion or pneumothorax is seen.  The cardiomediastinal silhouette is borderline enlarged. No acute osseous abnormalities are identified.  IMPRESSION: 1. Endotracheal tube seen ending just under 1 cm above the carina. This could be retracted 2 cm. 2. Vascular congestion again noted, with mildly increased interstitial markings, suggestive of mild interstitial edema, given the patient's symptoms. Borderline cardiomegaly.  These results were called by telephone at the time of interpretation on 12/09/2014 at 7:31 am to Sandie Ano RN at the [facility], who verbally acknowledged these results.   Electronically Signed   By: Roanna Raider M.D.   On: 12/09/2014 07:32   ASSESSMENT / PLAN:  NEUROLOGIC A:   Acute encephalopathy >> concern for HSV encephalitis, meningitis, metabolic (hypoglycemia on admission, elevated ammonia, AKI). Hx of ETOH, cocaine abuse. P:   Monitor mental status RASS goal 0 F/u anti-NMDAR, anti-AMPA Continue lactulose Thiamine, folic acid  PULMONARY OETT 1/61 >>> A:  Acute respiratory failure secondary to inability to protect airway >> difficult airway. Tobacco abuse. P:   Pressure support wean as tolerated >> mental status prevents extubation trial F/u CXR  CARDIOVASCULAR A: Hx HTN, HLD. P:  Continue norvasc Increase metoprolol to 25 mg bid on 3/27 Hold outpt lisinopril, simvastatin, nebivolol  RENAL A:  AKI >> creatinine might be reaching plateau. Gap/non gap metabolic acidosis >> improved 0/96. Hypokalemia. P:   Renal dose adjust meds Added HCO3 to IV fluids >> decrease rate to 40 ml/hr on 3/27 Check FeNa Replace electrolytes as needed F/u BMET If no improvement in renal fx and urine outpt decrease, then will need nephrology evaluation >> defer for now  GASTROINTESTINAL A:  Nutrition. Diarrhea >> C diff negative 3/24. P:   TF on vent Protonix  HEMATOLOGIC A:  Anemia of critical illness. P:  F/u CBC SQ heparin for DVT  prevention  INFECTIOUS A:   Possible meningitis. P:   Day 7 rocephin, vancomycin per ID  Blood 3/21 >>   ENDOCRINE A:  DM with renal complications. P:   SSI Change lantus to 15 units bid on 3/27 Hold outpt pioglitazone, metformin  CC time 40 minutes.  Coralyn Helling, MD [facility] Pulmonary/Critical Care 12/10/2014, 9:10 AM Pager:  [phone] After 3pm call: [phone]

## 2014-12-10 NOTE — Progress Notes (Signed)
eLink Physician-Brief Progress Note Patient Name: Gina Cox DOB: 1956-09-02 MRN: [id number]   Date of Service  12/10/2014  HPI/Events of Note  k 3 this am with creat 4.72 which is up and uop generally low  eICU Interventions  Repeat BMP at 1400 to trend     Intervention Category Minor Interventions: Electrolytes abnormality - evaluation and management  Henry Russel, P 12/10/2014, 6:26 AM

## 2014-12-10 NOTE — Progress Notes (Signed)
Pharmacy - Vancomycin dosing  Assessment: 58 y.oF with PMH of DM, HTN, HLD who presents with hypoglycemia and AMS after being found unresponsive at home. Currently on abx for suspected meningitis. Scr increased noticeably from 1.02 -->1.52-->2.58-->3.11 --> 4.54 --> 4.7 today, Vancomycin order held on 3/24 due to elevated level and checking VT/SCr   SCr continues to rise, now 4.7 ; CrCl 15 ml/min  Drug level / dose changes info: 3/23 2100 VT = 20.9 on 750 mg IV q12, changed to 500 mg q12h 3/24 2100 VT = 37.4 on 500 q12  (Vancomycin placed on hold) 3/25 2245 VT = 33.4 3/26 2246 Vanc level = 26.9   Goal of Therapy:  Vancomycin trough level 15-20 mcg/ml Eradication of infection Appropriate dosing of antibiotics for renal function and indication  Plan:   Continue to hold vancomycin.  Will check random vanc level tomorrow evening and reassess clearance at that time.  Clinical pharmacist may adjust trough time as appropriate based on tomorrow morning's SCr.  No renal adjustment needed for ceftriaxone  Terrilee Files, PharmD  12/10/2014, 12:06 AM

## 2014-12-11 ENCOUNTER — Inpatient Hospital Stay (HOSPITAL_COMMUNITY): Payer: Medicare Other

## 2014-12-11 ENCOUNTER — Encounter (HOSPITAL_COMMUNITY): Payer: Self-pay | Admitting: Pulmonary Disease

## 2014-12-11 DIAGNOSIS — N189 Chronic kidney disease, unspecified: Secondary | ICD-10-CM

## 2014-12-11 DIAGNOSIS — R4182 Altered mental status, unspecified: Secondary | ICD-10-CM

## 2014-12-11 DIAGNOSIS — N179 Acute kidney failure, unspecified: Secondary | ICD-10-CM

## 2014-12-11 DIAGNOSIS — G009 Bacterial meningitis, unspecified: Secondary | ICD-10-CM | POA: Insufficient documentation

## 2014-12-11 DIAGNOSIS — J9600 Acute respiratory failure, unspecified whether with hypoxia or hypercapnia: Secondary | ICD-10-CM | POA: Insufficient documentation

## 2014-12-11 DIAGNOSIS — E162 Hypoglycemia, unspecified: Secondary | ICD-10-CM

## 2014-12-11 DIAGNOSIS — R0600 Dyspnea, unspecified: Secondary | ICD-10-CM

## 2014-12-11 LAB — GLUCOSE, CAPILLARY
GLUCOSE-CAPILLARY: 173 mg/dL — AB (ref 70–99)
Glucose-Capillary: 206 mg/dL — ABNORMAL HIGH (ref 70–99)
Glucose-Capillary: 182 mg/dL — ABNORMAL HIGH (ref 70–99)
Glucose-Capillary: 169 mg/dL — ABNORMAL HIGH (ref 70–99)
Glucose-Capillary: 181 mg/dL — ABNORMAL HIGH (ref 70–99)
Glucose-Capillary: 228 mg/dL — ABNORMAL HIGH (ref 70–99)

## 2014-12-11 LAB — BLOOD GAS, ARTERIAL
ACID-BASE EXCESS: 1.3 mmol/L (ref 0.0–2.0)
Bicarbonate: 25.1 mEq/L — ABNORMAL HIGH (ref 20.0–24.0)
Drawn by: 422461
FIO2: 0.3 %
O2 SAT: 98.1 %
PEEP/CPAP: 5 cmH2O
PH ART: 7.436 (ref 7.350–7.450)
Patient temperature: 97.8
RATE: 14 resp/min
TCO2: 23.5 mmol/L (ref 0–100)
VT: 440 mL
pCO2 arterial: 37.7 mmHg (ref 35.0–45.0)
pO2, Arterial: 122 mmHg — ABNORMAL HIGH (ref 80.0–100.0)

## 2014-12-11 LAB — CBC
HCT: 25.6 % — ABNORMAL LOW (ref 36.0–46.0)
Hemoglobin: 8.5 g/dL — ABNORMAL LOW (ref 12.0–15.0)
MCH: 30.5 pg (ref 26.0–34.0)
MCHC: 33.2 g/dL (ref 30.0–36.0)
MCV: 91.8 fL (ref 78.0–100.0)
Platelets: 237 10*3/uL (ref 150–400)
RBC: 2.79 MIL/uL — ABNORMAL LOW (ref 3.87–5.11)
RDW: 14.6 % (ref 11.5–15.5)
WBC: 13.6 10*3/uL — AB (ref 4.0–10.5)

## 2014-12-11 LAB — VANCOMYCIN, RANDOM: Vancomycin Rm: 17 ug/mL

## 2014-12-11 LAB — CULTURE, BLOOD (ROUTINE X 2)
Culture: NO GROWTH
Culture: NO GROWTH

## 2014-12-11 LAB — MAGNESIUM: Magnesium: 2.6 mg/dL — ABNORMAL HIGH (ref 1.5–2.5)

## 2014-12-11 LAB — BASIC METABOLIC PANEL
ANION GAP: 14 (ref 5–15)
BUN: 117 mg/dL — ABNORMAL HIGH (ref 6–23)
CALCIUM: 7.9 mg/dL — AB (ref 8.4–10.5)
CO2: 24 mmol/L (ref 19–32)
Chloride: 110 mmol/L (ref 96–112)
Creatinine, Ser: 3.54 mg/dL — ABNORMAL HIGH (ref 0.50–1.10)
GFR calc Af Amer: 15 mL/min — ABNORMAL LOW (ref >90)
GFR calc non Af Amer: 13 mL/min — ABNORMAL LOW (ref >90)
Glucose, Bld: 180 mg/dL — ABNORMAL HIGH (ref 70–99)
Potassium: 2.8 mmol/L — ABNORMAL LOW (ref 3.5–5.1)
SODIUM: 148 mmol/L — AB (ref 135–145)

## 2014-12-11 LAB — CREATININE, URINE, RANDOM: CREATININE, URINE: 146.8 mg/dL

## 2014-12-11 LAB — SODIUM, URINE, RANDOM: Sodium, Ur: 41 mEq/L

## 2014-12-11 MED ORDER — POTASSIUM CHLORIDE 10 MEQ/50ML IV SOLN
10.0000 meq | INTRAVENOUS | Status: AC
  Administered 2014-12-11 (×2): 10 meq via INTRAVENOUS
  Filled 2014-12-11 (×2): qty 50

## 2014-12-11 MED ORDER — METOPROLOL TARTRATE 25 MG/10 ML ORAL SUSPENSION
50.0000 mg | Freq: Two times a day (BID) | ORAL | Status: DC
  Administered 2014-12-11 – 2014-12-15 (×8): 50 mg
  Filled 2014-12-11 (×12): qty 20

## 2014-12-11 MED ORDER — VANCOMYCIN HCL 10 G IV SOLR
1500.0000 mg | INTRAVENOUS | Status: DC
  Administered 2014-12-11: 1500 mg via INTRAVENOUS
  Filled 2014-12-11: qty 1500

## 2014-12-11 NOTE — Progress Notes (Addendum)
[facility]    Date of Admission:  12/04/2014   Total days of antibiotics 8        Day 8 ctx Q12        Day 8 vanco           ID: Gina Cox is a 59 y.o. female with acute encephalopathy in setting of hypoglycemia and resp distress requiring ventilation. Infectious work up suggestive of  Meningitis, possibly bacterial meningitis Active Problems:   Acute encephalopathy   Altered mental status   Acute respiratory failure with hypoxia   Encephalitis   Acute respiratory failure    Subjective: Afebrile, opens eyes to verbal stimuli but otherwise does not follow commands. Off of vent, failed vent wean. FiO2 remains at 30%  Medications:  . amLODipine  10 mg Per Tube Daily  . antiseptic oral rinse  7 mL Mouth Rinse QID  . cefTRIAXone (ROCEPHIN)  IV  2 g Intravenous Q12H  . chlorhexidine  15 mL Mouth Rinse BID  . feeding supplement (PRO-STAT SUGAR FREE 64)  30 mL Oral BID  . feeding supplement (VITAL HIGH PROTEIN)  1,000 mL Per Tube Q24H  . folic acid  1 mg Per Tube Daily  . free water  250 mL Per Tube Q6H  . insulin aspart  0-20 Units Subcutaneous 6 times per day  . insulin glargine  15 Units Subcutaneous BID  . lactulose  20 g Per Tube Daily  . metoprolol tartrate  50 mg Per Tube BID  . pantoprazole sodium  40 mg Per Tube Q24H  . thiamine  100 mg Per Tube Daily  . vancomycin  1,500 mg Intravenous Q48H    Objective: Vital signs in last 24 hours: Temp:  [97.5 F (36.4 C)-97.8 F (36.6 C)] 97.5 F (36.4 C) (03/28 1600) Pulse Rate:  [68-88] 72 (03/28 1555) Resp:  [10-21] 15 (03/28 1555) BP: (135-173)/(50-69) 135/55 mmHg (03/28 1555) SpO2:  [97 %-100 %] 99 % (03/28 1555) FiO2 (%):  [30 %] 30 % (03/28 1555) Weight:  [236 lb 5.3 oz (107.2 kg)] 236 lb 5.3 oz (107.2 kg) (03/28 0438)  .Physical Exam  Constitutional:  Sleeping, while intuvated. No distress.  HENT:  Mouth/Throat: ETT in place. No conjunctivitis  Cardiovascular: Normal rate,  regular rhythm and normal heart sounds. Exam reveals no gallop and no friction rub.  No murmur heard.  Pulmonary/Chest: Effort normal and breath sounds normal. No respiratory distress.  has no wheezes.  Abdominal: Soft. Active Bowel sounds.  exhibits no distension. There is no tenderness.  Neurological: opens eyes to verbal stimuli, not following commands, upgoing toe bilaterally Skin: Skin is warm and dry. No rash noted. No erythema.     Lab Results  Recent Labs  12/10/14 0315 12/10/14 1450 12/11/14 0455  WBC 15.7*  --  13.6*  HGB 8.2*  --  8.5*  HCT 25.0*  --  25.6*  NA 145 145 148*  K 3.0* 2.9* 2.8*  CL 110 107 110  CO2 BUN 105* 120* 117*  CREATININE 4.72* 4.47* 3.54*   Liver Panel  Recent Labs  12/09/14 1737 12/10/14 0315  PROT  --  6.4  ALBUMIN 2.4* 2.4*  2.5*  AST  --  34  ALT  --  39*  ALKPHOS  --  72  BILITOT  --  0.2*  BILIDIR  --  <0.1  IBILI  --  NOT CALCULATED   Sedimentation Rate No results for  input(s): ESRSEDRATE in the last 72 hours. C-Reactive Protein No results for input(s): CRP in the last 72 hours.  Microbiology: 3/21 blood cx ngtd 3/21 urine cx  3/24 cdiff negative Studies/Results: Mr Brain Wo Contrast  12/11/2014   CLINICAL DATA:  Patient was found unresponsive at home. History of diabetes, hypertension, and polysubstance abuse. Subsequent encounter.  EXAM: MRI HEAD WITHOUT CONTRAST  TECHNIQUE: Multiplanar, multiecho pulse sequences of the brain and surrounding structures were obtained without intravenous contrast.  COMPARISON:  12/05/2014 MR head.  CT head 12/04/2014.  FINDINGS: Redemonstrated are BILATERAL symmetric areas of restricted diffusion within the body and tail of the hippocampus extending into posterior cingulate gyrus. These are accompanied by T2 and FLAIR hyperintensity. No other areas of restricted diffusion are observed.  Motion degraded axial images through the remainder of the brain demonstrates premature atrophy  with chronic microvascular ischemic change. No definite hemorrhage. No mass lesion or hydrocephalus and no extra-axial fluid.  IMPRESSION: Stable abnormal brain MR with restricted diffusion and T2/FLAIR hyperintensity within the limbic system affecting the hippocampus primarily. Given the cerebrospinal fluid findings with elevated white count, but negative HSV, favor non-HSV viral encephalitis. The imaging pattern would be unusual for bacterial cerebritis despite CSF neutrophil predominance. No definite meningeal thickening on motion degraded FLAIR imaging.   Electronically Signed   By: Davonna Belling M.D.   On: 12/11/2014 15:00   Dg Chest Port 1 View  12/11/2014   CLINICAL DATA:  Respiratory failure.  EXAM: PORTABLE CHEST - 1 VIEW  COMPARISON:  12/10/2014  FINDINGS: Endotracheal tube remains implants with tip approximately 3 cm above the carina. Left jugular central venous catheter is unchanged with tip overlying the lower SVC. Enteric tube courses towards the left upper abdomen with tip not imaged. Cardiac silhouette remains upper limits of normal in size. Mild central pulmonary vascular congestion is unchanged. No overt pulmonary edema, airspace consolidation, pleural effusion, or pneumothorax is identified.  IMPRESSION: Lines and tubes as above.  No airspace consolidation.   Electronically Signed   By: Sebastian Ache   On: 12/11/2014 07:09   Dg Chest Port 1 View  12/10/2014   CLINICAL DATA:  Hypoxia/respiratory failure  EXAM: PORTABLE CHEST - 1 VIEW  COMPARISON:  December 09, 2014  FINDINGS: Endotracheal tube tip is 3.9 cm above the carina. Nasogastric tube tip and side port are below the diaphragm. Central catheter tip is in the superior vena cava. No pneumothorax. Lungs are clear. Heart is upper normal in size with pulmonary vascularity within normal limits. No adenopathy.  IMPRESSION: Tube and catheter positions as described without pneumothorax. No edema or consolidation.   Electronically Signed   By: Bretta Bang III M.D.   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 She remains on ceftriaxone plus vancomycin (renally dosed). CSF sent for HSV PCR adn RPR testing which was negative, however, it does not appear that any CSF aerobic cx was done with the specimen. Continue with presumptive treatment. Would dose vancomycin based upon random levels due to her acute on chronic kidney disease It looks like other specimens sent off of NMDA, limbic encephalitis testing, per neurology note. Spoke with lab to determine if there is extra csf to set up on culture. Sent in order to do cx  Acute on CKD = appears improving but would continue to monitor for therapeutic drug levels of  vanco to minimize nephrotoxicity  Encephalopathy = still unchanged.  Respiratory distress  On vent = defer to primary team for management  Drue [facility] for Infectious Diseases Cell: [phone] Pager: [phone]  12/11/2014, 5:28 PM

## 2014-12-11 NOTE — Progress Notes (Signed)
Spoke with Gina Cox with Infection Control.  After reviewing patient's chart, advised to discontinue droplet precautions.  Will continue to monitor.

## 2014-12-11 NOTE — Progress Notes (Signed)
Subjective: Continues on Rocephin and Vancomycin. BUN and Cr remain elevated, though Cr trending down. Remains intubated.   CSF NMDA and AMPA studies pending.   Objective: Current vital signs: BP 154/59 mmHg  Pulse 83  Temp(Src) 97.5 F (36.4 C) (Oral)  Resp 11  Ht 5\' 4"  (1.626 m)  Wt 107.2 kg (236 lb 5.3 oz)  BMI 40.55 kg/m2  SpO2 99% Vital signs in last 24 hours: Temp:  [97.5 F (36.4 C)-98 F (36.7 C)] 97.5 F (36.4 C) (03/28 0800) Pulse Rate:  [68-88] 83 (03/28 0800) Resp:  [11-21] 11 (03/28 0800) BP: (143-181)/(50-75) 154/59 mmHg (03/28 0800) SpO2:  [97 %-99 %] 99 % (03/28 0800) FiO2 (%):  [30 %] 30 % (03/28 0800) Weight:  [107.2 kg (236 lb 5.3 oz)] 107.2 kg (236 lb 5.3 oz) (03/28 0438)  Intake/Output from previous day: 03/27 0701 - 03/28 0700 In: 4035 [I.V.:1895; NG/GT:1540; IV Piggyback:600] Out: 2300 [Urine:2050; Stool:250] Intake/Output this shift: Total I/O In: 180 [I.V.:90; NG/GT:40; IV Piggyback:50] Out: 400 [Urine:400] Nutritional status:    Neurologic Exam: Patient is off sedatives, intubated on the vent. Follows no commands. Eyes are open, does not make eye contact or track examiner. CN 2-12: pupils 4 mm bilaterally reactive, no gaze preference, no nystagmus. Decreased blink to threat bilaterally.Corneal responses present, face symmetric, tongue: intubated. Motor: Moves all limbs upon painful stimuli. Sensory: reacts to pain. But does not localize.  Plantars: right upgoing,  left downgoing. Coordination and gait: unable to test. Neck supple.  Lab Results: Basic Metabolic Panel:  Recent Labs Lab 12/06/14 1500 12/07/14 0525  12/08/14 0515 12/09/14 0458 12/09/14 1737 12/10/14 0315 12/10/14 1450 12/11/14 0455  NA 143 147*  < > 145 147* 140 145 145 148*  K 3.2* 3.5  < > 3.4* 3.2* 4.6 3.0* 2.9* 2.8*  CL 111 115*  < > 114* 113* 107 110 107 110  CO2 24 21  < > 19 17* 18* 21 23 24   GLUCOSE 160* 150*  < > 234* 243* 375* 306* 334* 180*  BUN 23 32*   < > 57* 91* 105* 105* 120* 117*  CREATININE 1.02 1.52*  < > 3.11* 4.54* 4.70* 4.72* 4.47* 3.54*  CALCIUM 7.9* 8.2*  < > 8.3* 8.7 8.3* 8.3* 8.3* 7.9*  MG 1.3* 3.1*  --  3.1* 3.0*  --   --   --  2.6*  PHOS 3.3 3.3  --  5.0* 5.8* 7.0* 6.1*  --   --   < > = values in this interval not displayed.  Liver Function Tests:  Recent Labs Lab 12/04/14 2031 12/09/14 1737 12/10/14 0315  AST 25  --  34  ALT 13  --  39*  ALKPHOS 73  --  72  BILITOT 0.4  --  0.2*  PROT 7.5  --  6.4  ALBUMIN 3.8 2.4* 2.4*  2.5*   No results for input(s): LIPASE, AMYLASE in the last 168 hours.  Recent Labs Lab 12/06/14 1500 12/07/14 0525 12/08/14 1030  AMMONIA 22 33* 16    CBC:  Recent Labs Lab 12/04/14 2031  12/07/14 0525 12/08/14 0515 12/09/14 0458 12/10/14 0315 12/11/14 0455  WBC 8.7  < > 10.4 10.2 16.6* 15.7* 13.6*  NEUTROABS 7.1  --   --   --   --   --   --   HGB 10.2*  < > 7.9* 7.6* 8.0* 8.2* 8.5*  HCT 31.2*  < > 24.4* 23.2* 24.0* 25.0* 25.6*  MCV 94.3  < >  95.3 94.7 92.7 91.6 91.8  PLT 275  < > 167 181 209 215 237  < > = values in this interval not displayed.  Cardiac Enzymes:  Recent Labs Lab 12/05/14 0145 12/08/14 0515 12/09/14 0458  CKTOTAL  --  167 104  TROPONINI <0.03  --   --     Lipid Panel: No results for input(s): CHOL, TRIG, HDL, CHOLHDL, VLDL, LDLCALC in the last 168 hours.  CBG:  Recent Labs Lab 12/10/14 1653 12/10/14 2034 12/11/14 0024 12/11/14 0406 12/11/14 0745  GLUCAP 283* 225* 206* 182* 169*    Microbiology: Results for orders placed or performed during the hospital encounter of 12/04/14  Blood Culture (routine x 2)     Status: None   Collection Time: 12/04/14  8:10 PM  Result Value Ref Range Status   Specimen Description BLOOD RIGHT WRIST  Final   Special Requests BOTTLES DRAWN AEROBIC AND ANAEROBIC 5CC EACH  Final   Culture   Final    NO GROWTH 5 DAYS Performed at [facility]    Report Status 12/11/2014 FINAL  Final  Blood Culture  (routine x 2)     Status: None   Collection Time: 12/04/14  8:11 PM  Result Value Ref Range Status   Specimen Description BLOOD LEFT ARM  Final   Special Requests BOTTLES DRAWN AEROBIC AND ANAEROBIC 5CC EACH  Final   Culture   Final    NO GROWTH 5 DAYS Performed at [facility]    Report Status 12/11/2014 FINAL  Final  Urine culture     Status: None   Collection Time: 12/04/14  9:31 PM  Result Value Ref Range Status   Specimen Description URINE, CLEAN CATCH  Final   Special Requests NONE  Final   Colony Count   Final    >=100,000 COLONIES/ML Performed at [facility]    Culture   Final    Multiple bacterial morphotypes present, none predominant. Suggest appropriate recollection if clinically indicated. Performed at [facility]    Report Status 12/05/2014 FINAL  Final  MRSA PCR Screening     Status: None   Collection Time: 12/05/14  4:23 AM  Result Value Ref Range Status   MRSA by PCR NEGATIVE NEGATIVE Final    Comment:        The GeneXpert MRSA Assay (FDA approved for NASAL specimens only), is one component of a comprehensive MRSA colonization surveillance program. It is not intended to diagnose MRSA infection nor to guide or monitor treatment for MRSA infections.   Clostridium Difficile by PCR     Status: None   Collection Time: 12/07/14  4:28 PM  Result Value Ref Range Status   C difficile by pcr NEGATIVE NEGATIVE Final    Coagulation Studies: No results for input(s): LABPROT, INR in the last 72 hours.  Imaging: Dg Chest Port 1 View  12/11/2014   CLINICAL DATA:  Respiratory failure.  EXAM: PORTABLE CHEST - 1 VIEW  COMPARISON:  12/10/2014  FINDINGS: Endotracheal tube remains implants with tip approximately 3 cm above the carina. Left jugular central venous catheter is unchanged with tip overlying the lower SVC. Enteric tube courses towards the left upper abdomen with tip not imaged. Cardiac silhouette remains upper limits of normal in size.  Mild central pulmonary vascular congestion is unchanged. No overt pulmonary edema, airspace consolidation, pleural effusion, or pneumothorax is identified.  IMPRESSION: Lines and tubes as above.  No airspace consolidation.   Electronically Signed  By: Sebastian Ache   On: 12/11/2014 07:09   Dg Chest Port 1 View  12/10/2014   CLINICAL DATA:  Hypoxia/respiratory failure  EXAM: PORTABLE CHEST - 1 VIEW  COMPARISON:  December 09, 2014  FINDINGS: Endotracheal tube tip is 3.9 cm above the carina. Nasogastric tube tip and side port are below the diaphragm. Central catheter tip is in the superior vena cava. No pneumothorax. Lungs are clear. Heart is upper normal in size with pulmonary vascularity within normal limits. No adenopathy.  IMPRESSION: Tube and catheter positions as described without pneumothorax. No edema or consolidation.   Electronically Signed   By: Bretta Bang III M.D.   On: 12/10/2014 07:06    Medications:  Scheduled: . amLODipine  10 mg Per Tube Daily  . antiseptic oral rinse  7 mL Mouth Rinse QID  . cefTRIAXone (ROCEPHIN)  IV  2 g Intravenous Q12H  . chlorhexidine  15 mL Mouth Rinse BID  . feeding supplement (PRO-STAT SUGAR FREE 64)  30 mL Oral BID  . feeding supplement (VITAL HIGH PROTEIN)  1,000 mL Per Tube Q24H  . folic acid  1 mg Per Tube Daily  . free water  250 mL Per Tube Q6H  . insulin aspart  0-20 Units Subcutaneous 6 times per day  . insulin glargine  15 Units Subcutaneous BID  . lactulose  20 g Per Tube Daily  . metoprolol tartrate  25 mg Per Tube BID  . pantoprazole sodium  40 mg Per Tube Q24H  . thiamine  100 mg Per Tube Daily  . vancomycin  1,500 mg Intravenous Q48H    Assessment/Plan: 60 y.o. female admitted to the hospital after being found unresponsive at home. Initial work up revealed that CG was 54, elevated ammonia , and CT brain without acute abnormality. MRI brain 3/22 revealed signal abnormality on diffusion images involving the body and tail of the  hippocampus extending into the posterior cingulate gyrus. No significant abnormal enhancement. Overall, such findings could represent post ictal changes, hypoglycemic encephalopathy, limbic encephalitis. EEG without electrographic seizures but a pattern most consistent with encephalopathy.  Unclear etiology of continued encephalopathy. CSF concerning for bacterial meningitis. Appreciate ID input, will continue rocephin and vancomycin. CSF NMDA and AMPA studies pending. Would consider repeat MRI brain.   Will continue to follow.    Elspeth Cho, DO [facility] [phone]  If 7pm- 7am, please page neurology on call as listed in AMION.

## 2014-12-11 NOTE — Progress Notes (Signed)
CARE MANAGEMENT NOTE 12/11/2014  Patient:  Gina Cox, Gina Cox   Account Number:  [id number]  Date Initiated:  12/05/2014  Documentation initiated by:  Frisk,RHONDA  Subjective/Objective Assessment:   59 year old F brought to the [facility] ED on 12/04/2014 after being found down at home by daughter.  Last seen normal 1 day prior. Required intubation in the emergency department for airway protection     Action/Plan:   tbd   Anticipated DC Date:  12/14/2014   Anticipated DC Plan:  HOME/SELF CARE  In-house referral  Clinical Social Worker      DC Planning Services  CM consult      [facility] Choice  NA   Choice offered to / List presented to:  NA   DME arranged  NA           Status of service:  In process, will continue to follow Medicare Important Message given?   (If response is "NO", the following Medicare IM given date fields will be blank) Date Medicare IM given:   Medicare IM given by:   Date Additional Medicare IM given:   Additional Medicare IM given by:    Discharge Disposition:    Per UR Regulation:  Reviewed for med. necessity/level of care/duration of stay  If discussed at Long Length of Stay Meetings, dates discussed:    Comments:  December 11, 2014/Rhonda L. Earlene Plater, RN, BSN, CCM. Case Management [facility] [employer] [phone] No discharge needs present of time of review. Intaubated, off iv sedation but does not respond to stimuli.  Nerology in.  Poss bacterial menegitis. wbc elevated/ bld cultures are negative for growth/chem 12 panel abnormal, na 148, k+2.9  December 08, 2014/Rhonda L. Earlene Plater, RN, BSN, CCM. Case Management [facility] [employer] [phone] No discharge needs present of time of review. remains intubated, responsvie not sedated.  eeg pending  December 05, 2014/Rhonda L. Earlene Plater, RN, BSN, CCM. Case Management [facility] [phone] No discharge needs present of time of review.

## 2014-12-11 NOTE — Progress Notes (Signed)
ANTIBIOTIC CONSULT NOTE - FOLLOW UP  Pharmacy Consult for rocephin, vancomycin Indication: suspected meningitis  No Known Allergies  Patient Measurements: Height:  (162.6 cm) Weight: 236 lb 5.3 oz (107.2 kg) IBW/kg (Calculated) : 54.7  Vital Signs: Temp: 97.6 F (36.4 C) (03/28 0000) Temp Source: Axillary (03/28 0000) BP: 149/50 mmHg (03/28 0400) Pulse Rate: 75 (03/28 0400) Intake/Output from previous day: 03/27 0701 - 03/28 0700 In: 3275 [I.V.:1715; NG/GT:1460; IV Piggyback:100] Out: 2300 [Urine:2050; Stool:250] Intake/Output from this shift: Total I/O In: 1350 [I.V.:900; NG/GT:400; IV Piggyback:50] Out: 1500 [Urine:1250; Stool:250]  Labs:  Recent Labs  12/09/14 0458  12/10/14 0315 12/10/14 1450 12/11/14 0455  WBC 16.6*  --  15.7*  --  13.6*  HGB 8.0*  --  8.2*  --  8.5*  PLT 209  --  215  --  237  CREATININE 4.54*  < > 4.72* 4.47* 3.54*  < > = values in this interval not displayed. Estimated Creatinine Clearance: 20.2 mL/min (by C-G formula based on Cr of 3.54).  Recent Labs  12/08/14 2245 12/09/14 2246 12/10/14 2208 12/11/14 0455  VANCOTROUGH 33.4* 26.9*  --   --   VANCORANDOM  --   --  20.1 17.0     Microbiology: Recent Results (from the past 720 hour(s))  Blood Culture (routine x 2)     Status: None (Preliminary result)   Collection Time: 12/04/14  8:10 PM  Result Value Ref Range Status   Specimen Description BLOOD RIGHT WRIST  Final   Special Requests BOTTLES DRAWN AEROBIC AND ANAEROBIC 5CC EACH  Final   Culture   Final           BLOOD CULTURE RECEIVED NO GROWTH TO DATE CULTURE WILL BE HELD FOR 5 DAYS BEFORE ISSUING A FINAL NEGATIVE REPORT Performed at [facility]    Report Status PENDING  Incomplete  Blood Culture (routine x 2)     Status: None (Preliminary result)   Collection Time: 12/04/14  8:11 PM  Result Value Ref Range Status   Specimen Description BLOOD LEFT ARM  Final   Special Requests BOTTLES DRAWN AEROBIC AND  ANAEROBIC 5CC EACH  Final   Culture   Final           BLOOD CULTURE RECEIVED NO GROWTH TO DATE CULTURE WILL BE HELD FOR 5 DAYS BEFORE ISSUING A FINAL NEGATIVE REPORT Performed at [facility]    Report Status PENDING  Incomplete  Urine culture     Status: None   Collection Time: 12/04/14  9:31 PM  Result Value Ref Range Status   Specimen Description URINE, CLEAN CATCH  Final   Special Requests NONE  Final   Colony Count   Final    >=100,000 COLONIES/ML Performed at [facility]    Culture   Final    Multiple bacterial morphotypes present, none predominant. Suggest appropriate recollection if clinically indicated. Performed at [facility]    Report Status 12/05/2014 FINAL  Final  MRSA PCR Screening     Status: None   Collection Time: 12/05/14  4:23 AM  Result Value Ref Range Status   MRSA by PCR NEGATIVE NEGATIVE Final    Comment:        The GeneXpert MRSA Assay (FDA approved for NASAL specimens only), is one component of a comprehensive MRSA colonization surveillance program. It is not intended to diagnose MRSA infection nor to guide or monitor treatment for MRSA infections.   Clostridium Difficile by PCR  Status: None   Collection Time: 12/07/14  4:28 PM  Result Value Ref Range Status   C difficile by pcr NEGATIVE NEGATIVE Final    Anti-infectives    Start     Dose/Rate Route Frequency Ordered Stop   12/11/14 0700  vancomycin (VANCOCIN) 1,500 mg in sodium chloride 0.9 % 500 mL IVPB     1,500 mg 250 mL/hr over 120 Minutes Intravenous Every 48 hours 12/11/14 0609     12/08/14 1800  acyclovir (ZOVIRAX) 550 mg in dextrose 5 % 100 mL IVPB  Status:  Discontinued     550 mg 111 mL/hr over 60 Minutes Intravenous Every 24 hours 12/07/14 2243 12/09/14 1053   12/08/14 0600  ampicillin (OMNIPEN) 2 g in sodium chloride 0.9 % 50 mL IVPB  Status:  Discontinued     2 g 150 mL/hr over 20 Minutes Intravenous 3 times per day 12/07/14 2243 12/09/14 1053    12/07/14 1800  acyclovir (ZOVIRAX) 550 mg in dextrose 5 % 100 mL IVPB  Status:  Discontinued     550 mg 111 mL/hr over 60 Minutes Intravenous Every 12 hours 12/07/14 1253 12/07/14 2243   12/07/14 1400  ampicillin (OMNIPEN) 2 g in sodium chloride 0.9 % 50 mL IVPB  Status:  Discontinued     2 g 150 mL/hr over 20 Minutes Intravenous Every 6 hours 12/07/14 1253 12/07/14 2243   12/07/14 1000  vancomycin (VANCOCIN) 500 mg in sodium chloride 0.9 % 100 mL IVPB  Status:  Discontinued     500 mg 100 mL/hr over 60 Minutes Intravenous Every 12 hours 12/06/14 2227 12/07/14 1255   12/05/14 2000  cefTRIAXone (ROCEPHIN) 2 g in dextrose 5 % 50 mL IVPB  Status:  Discontinued     2 g 100 mL/hr over 30 Minutes Intravenous Every 24 hours 12/04/14 2022 12/05/14 0051   12/05/14 1000  vancomycin (VANCOCIN) IVPB 750 mg/150 ml premix     750 mg 150 mL/hr over 60 Minutes Intravenous Every 12 hours 12/05/14 0105 12/06/14 2329   12/05/14 0800  cefTRIAXone (ROCEPHIN) 2 g in dextrose 5 % 50 mL IVPB - Premix     2 g 100 mL/hr over 30 Minutes Intravenous Every 12 hours 12/05/14 0105     12/05/14 0115  ampicillin (OMNIPEN) 2 g in sodium chloride 0.9 % 50 mL IVPB  Status:  Discontinued     2 g 150 mL/hr over 20 Minutes Intravenous 6 times per day 12/05/14 0105 12/07/14 1253   12/05/14 0115  acyclovir (ZOVIRAX) 550 mg in dextrose 5 % 100 mL IVPB  Status:  Discontinued     550 mg 111 mL/hr over 60 Minutes Intravenous 3 times per day 12/05/14 0107 12/07/14 1253   12/04/14 2300  vancomycin (VANCOCIN) 2,000 mg in sodium chloride 0.9 % 500 mL IVPB  Status:  Discontinued     2,000 mg 250 mL/hr over 120 Minutes Intravenous  Once 12/04/14 2205 12/05/14 0051   12/04/14 2015  cefTRIAXone (ROCEPHIN) 2 g in dextrose 5 % 50 mL IVPB     2 g 100 mL/hr over 30 Minutes Intravenous  Once 12/04/14 2009 12/04/14 2100      Assessment: 58 y.oF with PMH of DM, HTN, HLD who presents with hypoglycemia and AMS after being found unresponsive at  home.  He's currently on abx for suspected meningitis.  Scr increased noticeably from 1.02 to 1.52 today.  3/21 >> Ceftriaxone >> 3/21 >> vanc >> 3/22 >> ampicillin >> 3/26 3/22 >> acyclovir >>  3/26  Tmax: afebrile WBCs: 13.6 Renal: SCr 3.54, CrCl 20 ml/min  3/21 blood x 2: sent 3/21 urine: NG (final) 3/23 CSF: high gluc, nml protein, high WBC; non-cloudy 3/22 HIV/RPR both negative 3/22 VDRL CSF: IP  3/23 LP: glucose 96 (slightly high), protein 42, wbc high 128, neutrophils high 82  Drug level / dose changes info: 3/23 2100 VT = 20.9 on 750 mg IV q12, changed to 500 mg q12h 3/24 2100 VT = 37.4 (Vanc held) 3/25 2245 Vanc level = 33.4 (Vanc held) 3/26 2246 Vanc level = 26.9 (Vanc held) 3/27 2200 Vanc level = 20.1 (Vanc held) 3/28 0500 Vanc level = 17   Goal of Therapy:  Vancomycin trough level 15-20 mcg/ml  Plan:  - continue ceftriaxone 2gm IV q12h - Restart Vancomycin @ 1500mg  IV q48h - f/u renal funct - F/u recommendations from ID   Alden Bensinger, Joselyn Glassman, PharmD 12/11/2014,6:10 AM

## 2014-12-11 NOTE — Progress Notes (Signed)
PULMONARY / CRITICAL CARE MEDICINE   Name: Gina Cox MRN: [id number] DOB: Jan 19, 1956    ADMISSION DATE:  12/04/2014  REFERRING MD :  Madilyn Hook EDP  CHIEF COMPLAINT:  Found down  INITIAL PRESENTATION: 59 y/o female found unresponsive by family at home.  CBG 54 in ER, and intubated for airway protection >> Difficult airway.  STUDIES:  3/21 CT head >> no acute intracranial process 3/22 MRI Brain >> hippocampal diffusion signal abnormal to the posterior singulate gyrus  3/22 EEG >> no seizure activity 3/22 LP >> glucose 96, protein 42, WBC 128 (82%N), RBC 13 3/25 EEG >> toxic/metabolic encephalopathy.  No seizure activity  SIGNIFICANT EVENTS: 3/21 Admit 3/22 Neurology consulted 3/24 ID consulted 3/26 acyclovir, ampicillin, decadron d/c'ed  SUBJECTIVE:   RN reports hypokalemia.  Mild HTN.  VITAL SIGNS: Temp:  [97.5 F (36.4 C)-98 F (36.7 C)] 97.5 F (36.4 C) (03/28 0800) Pulse Rate:  [68-88] 73 (03/28 1158) Resp:  [10-21] 16 (03/28 1158) BP: (143-173)/(50-71) 160/61 mmHg (03/28 1158) SpO2:  [97 %-100 %] 100 % (03/28 1158) FiO2 (%):  [30 %] 30 % (03/28 1158) Weight:  [236 lb 5.3 oz (107.2 kg)] 236 lb 5.3 oz (107.2 kg) (03/28 0438)   VENTILATOR SETTINGS: Vent Mode:  [-] PRVC FiO2 (%):  [30 %] 30 % Set Rate:  [14 bmp] 14 bmp Vt Set:  [440 mL] 440 mL PEEP:  [5 cmH20] 5 cmH20 Pressure Support:  [8 cmH20] 8 cmH20 Plateau Pressure:  [7 cmH20-15 cmH20] 10 cmH20   INTAKE / OUTPUT:  Intake/Output Summary (Last 24 hours) at 12/11/14 1317 Last data filed at 12/11/14 1128  Gross per 24 hour  Intake   3820 ml  Output   3175 ml  Net    645 ml   PHYSICAL EXAMINATION: General: no distress Neuro: opens eyes with stimulation, no following commands, resists passive movements HEENT: ETT in place, mm pink moist Cardiovascular: regular Lungs: even/non-labored, scattered rhonchi Abdomen: soft, non tender Musculoskeletal: 2+ edema Skin: no rashes  LABS:  CBC  Recent  Labs Lab 12/09/14 0458 12/10/14 0315 12/11/14 0455  WBC 16.6* 15.7* 13.6*  HGB 8.0* 8.2* 8.5*  HCT 24.0* 25.0* 25.6*  PLT 209 215 237   Coag's  Recent Labs Lab 12/04/14 2031  INR 0.97   BMET  Recent Labs Lab 12/10/14 0315 12/10/14 1450 12/11/14 0455  NA 145 145 148*  K 3.0* 2.9* 2.8*  CL 110 107 110  CO2 BUN 105* 120* 117*  CREATININE 4.72* 4.47* 3.54*  GLUCOSE 306* 334* 180*   Electrolytes  Recent Labs Lab 12/08/14 0515 12/09/14 0458 12/09/14 1737 12/10/14 0315 12/10/14 1450 12/11/14 0455  CALCIUM 8.3* 8.7 8.3* 8.3* 8.3* 7.9*  MG 3.1* 3.0*  --   --   --  2.6*  PHOS 5.0* 5.8* 7.0* 6.1*  --   --    Sepsis Markers  Recent Labs Lab 12/04/14 2039 12/05/14 0145 12/05/14 0216  LATICACIDVEN 1.34 1.8  --   PROCALCITON  --   --  <0.10   ABG  Recent Labs Lab 12/09/14 1700 12/10/14 0400 12/11/14 0348  PHART 7.375 7.433 7.436  PCO2ART 29.4* 29.4* 37.7  PO2ART 128.0* 135.0* 122.0*   Liver Enzymes  Recent Labs Lab 12/04/14 2031 12/09/14 1737 12/10/14 0315  AST 25  --  34  ALT 13  --  39*  ALKPHOS 73  --  72  BILITOT 0.4  --  0.2*  ALBUMIN 3.8 2.4* 2.4*  2.5*  Cardiac Enzymes  Recent Labs Lab 12/05/14 0145  TROPONINI <0.03   Glucose  Recent Labs Lab 12/10/14 0831 12/10/14 1653 12/10/14 2034 12/11/14 0024 12/11/14 0406 12/11/14 0745  GLUCAP 284* 283* 225* 206* 182* 169*   Imaging Dg Chest Port 1 View  12/10/2014   CLINICAL DATA:  Hypoxia/respiratory failure  EXAM: PORTABLE CHEST - 1 VIEW  COMPARISON:  December 09, 2014  FINDINGS: Endotracheal tube tip is 3.9 cm above the carina. Nasogastric tube tip and side port are below the diaphragm. Central catheter tip is in the superior vena cava. No pneumothorax. Lungs are clear. Heart is upper normal in size with pulmonary vascularity within normal limits. No adenopathy.  IMPRESSION: Tube and catheter positions as described without pneumothorax. No edema or consolidation.    Electronically Signed   By: Bretta Bang III M.D.   On: 12/10/2014 07:06   ASSESSMENT / PLAN:  NEUROLOGIC A:   Acute encephalopathy >> concern for HSV encephalitis, meningitis, metabolic (hypoglycemia on admission, elevated ammonia, AKI). Hx of ETOH, cocaine abuse. P:   Monitor mental status RASS goal 0 F/u anti-NMDAR, anti-AMPA Continue lactulose Thiamine, folic acid  PULMONARY OETT 1/61 >>> A:  Acute respiratory failure secondary to inability to protect airway >> difficult airway. Tobacco abuse. P:   Pressure support wean as tolerated >> mental status prevents extubation trial F/u CXR  CARDIOVASCULAR A: Hx HTN, HLD. P:  Continue norvasc Increase metoprolol to 50 mg bid on 3/28 Hold outpt lisinopril, simvastatin, nebivolol  RENAL A:  AKI >> creatinine might be reaching plateau.  FENa 0.67 %, suggestive of pre-renal failure Gap/non gap metabolic acidosis >> improved 0/96. Hypokalemia. P:   Renal dose adjust meds Continue HCO3 in IV fluids >> decrease rate to 40 ml/hr on 3/27 Replace electrolytes as needed.  Follow K closely with bicarb gtt F/u BMET If no improvement in renal fx and urine outpt decrease, then will need nephrology evaluation >> defer for now  GASTROINTESTINAL A:  Nutrition. Diarrhea >> C diff negative 3/24. P:   TF on vent Protonix  HEMATOLOGIC A:  Anemia of critical illness. P:  F/u CBC SQ heparin for DVT prevention  INFECTIOUS A:   Possible meningitis. P:   Day 8 rocephin, vancomycin per ID  Blood 3/21 >> neg  ENDOCRINE A:  DM with renal complications. P:   SSI Change lantus to 15 units bid on 3/27 Hold outpt pioglitazone, metformin    Canary Brim, NP-C [facility] Pgr: [phone] or [phone]    12/11/2014, 1:17 PM

## 2014-12-11 NOTE — Progress Notes (Signed)
NUTRITION FOLLOW UP  Intervention:   Continue Vital High Protein via OGT goal rate of 40 ml/hr.   30 ml Pro-stat BID  Current TF regimen provides 1250 kcal (23 g/kg ideal body weight), 114 g protein and 803 ml free water  Nutrition Dx:   Inadequate oral intake related to inability to eat as evidenced by NPO status; ongoing.  Goal:   Enteral nutrition to provide 60-70% of estimated caloric needs (22-25 g/kg of ideal body weight) and 100% of estimated protein needs per ASPEN guideline of hypo caloric high protein feeding of critically ill obese individuals; met.  Monitor:   TF initiation/tolerance/adequacy, weight trends, labs, I/Os  Assessment:   Pt found unresponsive, brought to ED and required intubation. PMH significant for HTN, DM, polysubstance abuse.   Patient is currently intubated on ventilator support MV: 6.0 L/min Temp (24hrs), Avg:97.7 F (36.5 C), Min:97.5 F (36.4 C), Max:98 F (36.7 C)  Propofol: none  No signs of fat or muscle wasting  Pt discussed in pt rounds, tolerating TF well  Possible meningitis  Pt in + fluid balance, admission weight 215 lbs current weight 236 lbs  Labs reviewed  CBGs 169-225 Na elevated 148 K low 2.8 BUN elevated 117  Height: Ht Readings from Last 1 Encounters:  12/07/14 '5\' 4"'  (1.626 m)    Weight Status:   Wt Readings from Last 1 Encounters:  12/11/14 236 lb 5.3 oz (107.2 kg)    Re-estimated needs:  Kcal: 3244-0102 (11-14 kcal/kg actual body weight) Protein: >/= 110 g protein Fluid: >/= 1.6 L * pt in + fluid balance, will use admission weight of 215 lbs  Skin: intact  Diet Order:     Intake/Output Summary (Last 24 hours) at 12/11/14 1209 Last data filed at 12/11/14 1128  Gross per 24 hour  Intake   3820 ml  Output   3175 ml  Net    645 ml    Last BM: 3/28- 875 mL output via rectal tube   Labs:   Recent Labs Lab 12/08/14 0515 12/09/14 0458 12/09/14 1737 12/10/14 0315 12/10/14 1450  12/11/14 0455  NA 145 147* 140 145 145 148*  K 3.4* 3.2* 4.6 3.0* 2.9* 2.8*  CL 114* 113* 107 110 107 110  CO2 19 17* 18* '21 23 24  ' BUN 57* 91* 105* 105* 120* 117*  CREATININE 3.11* 4.54* 4.70* 4.72* 4.47* 3.54*  CALCIUM 8.3* 8.7 8.3* 8.3* 8.3* 7.9*  MG 3.1* 3.0*  --   --   --  2.6*  PHOS 5.0* 5.8* 7.0* 6.1*  --   --   GLUCOSE 234* 243* 375* 306* 334* 180*    CBG (last 3)   Recent Labs  12/11/14 0024 12/11/14 0406 12/11/14 0745  GLUCAP 206* 182* 169*    Scheduled Meds: . amLODipine  10 mg Per Tube Daily  . antiseptic oral rinse  7 mL Mouth Rinse QID  . cefTRIAXone (ROCEPHIN)  IV  2 g Intravenous Q12H  . chlorhexidine  15 mL Mouth Rinse BID  . feeding supplement (PRO-STAT SUGAR FREE 64)  30 mL Oral BID  . feeding supplement (VITAL HIGH PROTEIN)  1,000 mL Per Tube Q24H  . folic acid  1 mg Per Tube Daily  . free water  250 mL Per Tube Q6H  . insulin aspart  0-20 Units Subcutaneous 6 times per day  . insulin glargine  15 Units Subcutaneous BID  . lactulose  20 g Per Tube Daily  . metoprolol tartrate  25  mg Per Tube BID  . pantoprazole sodium  40 mg Per Tube Q24H  . potassium chloride  10 mEq Intravenous Q1 Hr x 2  . thiamine  100 mg Per Tube Daily  . vancomycin  1,500 mg Intravenous Q48H    Continuous Infusions: . sodium chloride 1,000 mL (12/10/14 1655)  .  sodium bicarbonate  infusion 1000 mL 40 mL/hr at 12/10/14 [place], [place], [place]

## 2014-12-12 ENCOUNTER — Inpatient Hospital Stay (HOSPITAL_COMMUNITY): Payer: Medicare Other

## 2014-12-12 DIAGNOSIS — G009 Bacterial meningitis, unspecified: Secondary | ICD-10-CM

## 2014-12-12 DIAGNOSIS — J9690 Respiratory failure, unspecified, unspecified whether with hypoxia or hypercapnia: Secondary | ICD-10-CM | POA: Insufficient documentation

## 2014-12-12 LAB — CBC
HEMATOCRIT: 26.9 % — AB (ref 36.0–46.0)
Hemoglobin: 8.7 g/dL — ABNORMAL LOW (ref 12.0–15.0)
MCH: 30.2 pg (ref 26.0–34.0)
MCHC: 32.3 g/dL (ref 30.0–36.0)
MCV: 93.4 fL (ref 78.0–100.0)
Platelets: 236 10*3/uL (ref 150–400)
RBC: 2.88 MIL/uL — AB (ref 3.87–5.11)
RDW: 14.7 % (ref 11.5–15.5)
WBC: 8.7 10*3/uL (ref 4.0–10.5)

## 2014-12-12 LAB — BLOOD GAS, ARTERIAL
ACID-BASE EXCESS: 8.8 mmol/L — AB (ref 0.0–2.0)
BICARBONATE: 33.1 meq/L — AB (ref 20.0–24.0)
Drawn by: 103701
FIO2: 0.3 %
O2 Saturation: 98.1 %
PCO2 ART: 46.4 mmHg — AB (ref 35.0–45.0)
PEEP: 5 cmH2O
PH ART: 7.467 — AB (ref 7.350–7.450)
Patient temperature: 98.6
RATE: 14 resp/min
TCO2: 30.9 mmol/L (ref 0–100)
VT: 440 mL
pO2, Arterial: 113 mmHg — ABNORMAL HIGH (ref 80.0–100.0)

## 2014-12-12 LAB — GLUCOSE, CAPILLARY
GLUCOSE-CAPILLARY: 90 mg/dL (ref 70–99)
GLUCOSE-CAPILLARY: 136 mg/dL — AB (ref 70–99)
Glucose-Capillary: 126 mg/dL — ABNORMAL HIGH (ref 70–99)
Glucose-Capillary: 164 mg/dL — ABNORMAL HIGH (ref 70–99)
Glucose-Capillary: 174 mg/dL — ABNORMAL HIGH (ref 70–99)
Glucose-Capillary: 196 mg/dL — ABNORMAL HIGH (ref 70–99)

## 2014-12-12 LAB — BASIC METABOLIC PANEL
ANION GAP: 11 (ref 5–15)
Anion gap: 7 (ref 5–15)
BUN: 100 mg/dL — AB (ref 6–23)
BUN: 86 mg/dL — ABNORMAL HIGH (ref 6–23)
CALCIUM: 8.6 mg/dL (ref 8.4–10.5)
CHLORIDE: 109 mmol/L (ref 96–112)
CO2: 33 mmol/L — AB (ref 19–32)
CO2: 34 mmol/L — ABNORMAL HIGH (ref 19–32)
Calcium: 8.6 mg/dL (ref 8.4–10.5)
Chloride: 112 mmol/L (ref 96–112)
Creatinine, Ser: 2.41 mg/dL — ABNORMAL HIGH (ref 0.50–1.10)
Creatinine, Ser: 1.85 mg/dL — ABNORMAL HIGH (ref 0.50–1.10)
GFR calc Af Amer: 24 mL/min — ABNORMAL LOW (ref >90)
GFR calc non Af Amer: 21 mL/min — ABNORMAL LOW (ref >90)
GFR calc non Af Amer: 29 mL/min — ABNORMAL LOW (ref >90)
GFR, EST AFRICAN AMERICAN: 33 mL/min — AB (ref >90)
GLUCOSE: 136 mg/dL — AB (ref 70–99)
Glucose, Bld: 193 mg/dL — ABNORMAL HIGH (ref 70–99)
POTASSIUM: 2.7 mmol/L — AB (ref 3.5–5.1)
POTASSIUM: 4.6 mmol/L (ref 3.5–5.1)
SODIUM: 153 mmol/L — AB (ref 135–145)
SODIUM: 153 mmol/L — AB (ref 135–145)

## 2014-12-12 LAB — SODIUM, URINE, RANDOM: Sodium, Ur: 98 mEq/L

## 2014-12-12 LAB — OSMOLALITY, URINE: OSMOLALITY UR: 447 mosm/kg (ref 390–1090)

## 2014-12-12 LAB — VANCOMYCIN, RANDOM: Vancomycin Rm: 27.6 ug/mL

## 2014-12-12 LAB — PHOSPHORUS: Phosphorus: 2.9 mg/dL (ref 2.3–4.6)

## 2014-12-12 LAB — MAGNESIUM: Magnesium: 2.1 mg/dL (ref 1.5–2.5)

## 2014-12-12 MED ORDER — POTASSIUM CHLORIDE 20 MEQ/15ML (10%) PO SOLN
40.0000 meq | ORAL | Status: AC
  Administered 2014-12-12 (×4): 40 meq via ORAL
  Filled 2014-12-12 (×4): qty 30

## 2014-12-12 MED ORDER — INSULIN GLARGINE 100 UNIT/ML ~~LOC~~ SOLN
8.0000 [IU] | Freq: Two times a day (BID) | SUBCUTANEOUS | Status: DC
  Administered 2014-12-12 – 2014-12-23 (×21): 8 [IU] via SUBCUTANEOUS
  Filled 2014-12-12 (×28): qty 0.08

## 2014-12-12 NOTE — Progress Notes (Signed)
Pt was on PS and having periods of apnea, returned to full support.

## 2014-12-12 NOTE — Progress Notes (Signed)
CRITICAL VALUE ALERT  Critical value received Vancomycin level 27.6  Date of notification:12/12/2014  Time of notification: 1210 Critical value read back:yes  Nurse who received alert:Tura Roller Madelin Rear RN MD notified (1st page):  notified [name] Rharm D  Time of first page:  MD notified (2nd page):  Time of second page:  Responding MD:  Time MD responded:

## 2014-12-12 NOTE — Progress Notes (Addendum)
ANTIBIOTIC CONSULT NOTE - FOLLOW UP  Pharmacy Consult for rocephin, vancomycin Indication: suspected meningitis  No Known Allergies  Patient Measurements: Height:  (162.6 cm) Weight: 232 lb 2.3 oz (105.3 kg) IBW/kg (Calculated) : 54.7  Vital Signs: Temp: 97.6 F (36.4 C) (03/29 0800) Temp Source: Oral (03/29 0800) BP: 164/57 mmHg (03/29 0600) Pulse Rate: 67 (03/29 0600) Intake/Output from previous day: 03/28 0701 - 03/29 0700 In: 4220 [I.V.:1980; NG/GT:2040; IV Piggyback:200] Out: 3550 [Urine:3425; Stool:125] Intake/Output from this shift: Total I/O In: -  Out: 550 [Urine:550]  Labs:  Recent Labs  12/09/14 1023  12/10/14 0315 12/10/14 1450 12/11/14 0455 12/12/14 0445  WBC  --   --  15.7*  --  13.6* 8.7  HGB  --   --  8.2*  --  8.5* 8.7*  PLT  --   --  215  --  237 236  LABCREA 146.8  --   --   --   --   --   CREATININE  --   < > 4.72* 4.47* 3.54* 2.41*  < > = values in this interval not displayed. Estimated Creatinine Clearance: 29.4 mL/min (by C-G formula based on Cr of 2.41).  Recent Labs  12/09/14 2246 12/10/14 2208 12/11/14 0455  VANCOTROUGH 26.9*  --   --   VANCORANDOM  --  20.1 17.0     Microbiology: Recent Results (from the past 720 hour(s))  Blood Culture (routine x 2)     Status: None   Collection Time: 12/04/14  8:10 PM  Result Value Ref Range Status   Specimen Description BLOOD RIGHT WRIST  Final   Special Requests BOTTLES DRAWN AEROBIC AND ANAEROBIC 5CC EACH  Final   Culture   Final    NO GROWTH 5 DAYS Performed at [facility]    Report Status 12/11/2014 FINAL  Final  Blood Culture (routine x 2)     Status: None   Collection Time: 12/04/14  8:11 PM  Result Value Ref Range Status   Specimen Description BLOOD LEFT ARM  Final   Special Requests BOTTLES DRAWN AEROBIC AND ANAEROBIC 5CC EACH  Final   Culture   Final    NO GROWTH 5 DAYS Performed at [facility]    Report Status 12/11/2014 FINAL  Final  Urine  culture     Status: None   Collection Time: 12/04/14  9:31 PM  Result Value Ref Range Status   Specimen Description URINE, CLEAN CATCH  Final   Special Requests NONE  Final   Colony Count   Final    >=100,000 COLONIES/ML Performed at [facility]    Culture   Final    Multiple bacterial morphotypes present, none predominant. Suggest appropriate recollection if clinically indicated. Performed at [facility]    Report Status 12/05/2014 FINAL  Final  MRSA PCR Screening     Status: None   Collection Time: 12/05/14  4:23 AM  Result Value Ref Range Status   MRSA by PCR NEGATIVE NEGATIVE Final    Comment:        The GeneXpert MRSA Assay (FDA approved for NASAL specimens only), is one component of a comprehensive MRSA colonization surveillance program. It is not intended to diagnose MRSA infection nor to guide or monitor treatment for MRSA infections.   CSF culture     Status: None (Preliminary result)   Collection Time: 12/06/14 11:25 AM  Result Value Ref Range Status   Specimen Description CSF  Final  Special Requests Normal  Final   Gram Stain   Final    NO WBC SEEN NO ORGANISMS SEEN CYTOSPIN SLIDE Performed at [facility]    Culture NO GROWTH Performed at [facility]   Final   Report Status PENDING  Incomplete  Clostridium Difficile by PCR     Status: None   Collection Time: 12/07/14  4:28 PM  Result Value Ref Range Status   C difficile by pcr NEGATIVE NEGATIVE Final    Assessment: 58 y.oF with PMH of DM, HTN, HLD who presents with hypoglycemia and AMS after being found unresponsive at home.  She's currently on abx for likely bacterial meningitis.    3/21 >> Ceftriaxone >> 3/21 >> vanc >> 3/22 >> ampicillin >> 3/26 3/22 >> acyclovir for r/o HSV >> 3/26  3/24 >> decadron for meningitis>>3/26  Tmax: afebrile WBCs: down 15.7 Renal: new AKI, SCr trending down, now 2.41; CrCl 40 ml/min (CG), 27 ml/min (normalized) (could be d/t  acyclovir; however, FeNa suggestive of pre-renal failure), UOP good, hydrating   3/21 blood x 2: NGF 3/21 urine: >100k col/ml, none predominant FINAL 3/23 CSF: high gluc, nml protein, high WBC; non-cloudy 3/22 HIV/RPR both negative 3/22 VDRL CSF: IP 3/24 cdiff (-) 3/23: HSV PCR (-)  3/23 LP: glucose 96 (slightly high), protein 42, wbc high 128, neutrophils high 82  Drug level / dose changes info: 3/23 2100 VT = 20.9 on 750 mg IV q12, changed to 500 mg q12h 3/24 2100 VT = 37.4 after one dose of 500 q12 (HOLD) 3/25 2245 VT = 33.4 (cont to hold) 3/26 2246 Vanc level = 26.9 (cont to hold) 3/27 2200 Vanc level = 20.1 (cont to hold) 3/28 0500 Vanc level = 17 ==> restart Vanc 1500mg  IV q48h 3/29 1000 Vanc level = _____  Today is day #9 Vanc (dosing based on random levels) and Ceftriaxone 2g IV q12h for likely bacterial meningitis.  Goal of Therapy:  Vancomycin trough level 15-20 mcg/ml  Doses adjusted per renal function Eradication of infection  Plan:  1.  Vancomycin currently being dosed with q48h frequency and last dose was 1500 mg at ~7am 3/28. With improving SCr, will check random vancomycin level today to see if q24h frequency now more appropriate. 2.  Continue Ceftriaxone 2g IV q12h.  No renal adjustments. 3.  Continue to monitor SCr and f/u ID recommendations.  [name], PharmD, BCPS Pager: [phone] 12/12/2014 9:13 AM   Addendum 12/12/2014 12:35 PM  Random vancomycin level = 27.6 (~28 hours since 1500 mg dose)  Plan: Will not re-dose at this time.  Follow another random level in AM.  [name], PharmD, BCPS Pager: [phone] 12/12/2014 12:36 PM

## 2014-12-12 NOTE — Progress Notes (Signed)
eLink Physician-Brief Progress Note Patient Name: KAROLYNN ANDREASSEN DOB: Feb 15, 1956 MRN: [id number]   Date of Service  12/12/2014  HPI/Events of Note  BMP Latest Ref Rng 12/12/2014 12/11/2014 12/10/2014  Glucose 70 - 99 mg/dL 412(I) 786(V) 672(C)  BUN 6 - 23 mg/dL PENDING 947(S) 962(E)  Creatinine 0.50 - 1.10 mg/dL 3.66(Q) 9.47(M) 5.46(T)  Sodium 135 - 145 mmol/L 153(H) 148(H) 145  Potassium 3.5 - 5.1 mmol/L 2.7(LL) 2.8(L) 2.9(L)  Chloride 96 - 112 mmol/L 109 110 107  CO2 19 - 32 mmol/L 33(H) 24 23  Calcium 8.4 - 10.5 mg/dL 8.6 7.9(L) 8.3(L)      eICU Interventions   Will give KCL per tube and f/u labs in PM.      Intervention Category Major Interventions: Other:  Kathyjo Briere 12/12/2014, 5:44 AM

## 2014-12-12 NOTE — Progress Notes (Signed)
Subjective: Continues on Rocephin and Vancomycin. No overnight events. MRI brain completed, imaging reviewed and overall stable.   CSF NMDA and AMPA studies pending.   Objective: Current vital signs: BP 95/39 mmHg  Pulse 80  Temp(Src) 97.6 F (36.4 C) (Oral)  Resp 16  Ht 5\' 4"  (1.626 m)  Wt 105.3 kg (232 lb 2.3 oz)  BMI 39.83 kg/m2  SpO2 100% Vital signs in last 24 hours: Temp:  [97.5 F (36.4 C)-97.7 F (36.5 C)] 97.6 F (36.4 C) (03/29 0800) Pulse Rate:  [66-92] 80 (03/29 0800) Resp:  [11-19] 16 (03/29 0800) BP: (95-164)/(39-65) 95/39 mmHg (03/29 0800) SpO2:  [99 %-100 %] 100 % (03/29 0800) FiO2 (%):  [30 %] 30 % (03/29 0800) Weight:  [105.3 kg (232 lb 2.3 oz)] 105.3 kg (232 lb 2.3 oz) (03/29 0252)  Intake/Output from previous day: 03/28 0701 - 03/29 0700 In: 4220 [I.V.:1980; NG/GT:2040; IV Piggyback:200] Out: 3550 [Urine:3425; Stool:125] Intake/Output this shift: Total I/O In: 180 [I.V.:90; NG/GT:40; IV Piggyback:50] Out: 550 [Urine:550] Nutritional status:    Neurologic Exam: Patient is off sedatives, intubated on the vent. Follows no commands. Eyes are open, does not make eye contact or track examiner. CN 2-12: pupils 4 mm bilaterally reactive, no gaze preference, no nystagmus. Decreased blink to threat bilaterally.Corneal responses present, face symmetric, tongue: intubated. Motor: Moves all limbs upon painful stimuli. Sensory: reacts to pain. But does not localize.  Plantars: right upgoing,  left downgoing. Coordination and gait: unable to test. Neck supple.  Lab Results: Basic Metabolic Panel:  Recent Labs Lab 12/06/14 1500 12/07/14 0525  12/08/14 0515 12/09/14 0458 12/09/14 1737 12/10/14 0315 12/10/14 1450 12/11/14 0455 12/12/14 0445  NA 143 147*  < > 145 147* 140 145 145 148* 153*  K 3.2* 3.5  < > 3.4* 3.2* 4.6 3.0* 2.9* 2.8* 2.7*  CL 111 115*  < > 114* 113* 107 110 107 110 109  CO2 24 21  < > 19 17* 18* 21 23 24  33*  GLUCOSE 160* 150*  < >  234* 243* 375* 306* 334* 180* 136*  BUN 23 32*  < > 57* 91* 105* 105* 120* 117* 100*  CREATININE 1.02 1.52*  < > 3.11* 4.54* 4.70* 4.72* 4.47* 3.54* 2.41*  CALCIUM 7.9* 8.2*  < > 8.3* 8.7 8.3* 8.3* 8.3* 7.9* 8.6  MG 1.3* 3.1*  --  3.1* 3.0*  --   --   --  2.6*  --   PHOS 3.3 3.3  --  5.0* 5.8* 7.0* 6.1*  --   --   --   < > = values in this interval not displayed.  Liver Function Tests:  Recent Labs Lab 12/09/14 1737 12/10/14 0315  AST  --  34  ALT  --  39*  ALKPHOS  --  72  BILITOT  --  0.2*  PROT  --  6.4  ALBUMIN 2.4* 2.4*  2.5*   No results for input(s): LIPASE, AMYLASE in the last 168 hours.  Recent Labs Lab 12/06/14 1500 12/07/14 0525 12/08/14 1030  AMMONIA 22 33* 16    CBC:  Recent Labs Lab 12/08/14 0515 12/09/14 0458 12/10/14 0315 12/11/14 0455 12/12/14 0445  WBC 10.2 16.6* 15.7* 13.6* 8.7  HGB 7.6* 8.0* 8.2* 8.5* 8.7*  HCT 23.2* 24.0* 25.0* 25.6* 26.9*  MCV 94.7 92.7 91.6 91.8 93.4  PLT 181 209 215 237 236    Cardiac Enzymes:  Recent Labs Lab 12/08/14 0515 12/09/14 0458  CKTOTAL 167 104  Lipid Panel: No results for input(s): CHOL, TRIG, HDL, CHOLHDL, VLDL, LDLCALC in the last 168 hours.  CBG:  Recent Labs Lab 12/11/14 1604 12/11/14 2027 12/12/14 0042 12/12/14 0355 12/12/14 0757  GLUCAP 181* 228* 196* 126* 90    Microbiology: Results for orders placed or performed during the hospital encounter of 12/04/14  Blood Culture (routine x 2)     Status: None   Collection Time: 12/04/14  8:10 PM  Result Value Ref Range Status   Specimen Description BLOOD RIGHT WRIST  Final   Special Requests BOTTLES DRAWN AEROBIC AND ANAEROBIC 5CC EACH  Final   Culture   Final    NO GROWTH 5 DAYS Performed at [facility]    Report Status 12/11/2014 FINAL  Final  Blood Culture (routine x 2)     Status: None   Collection Time: 12/04/14  8:11 PM  Result Value Ref Range Status   Specimen Description BLOOD LEFT ARM  Final   Special Requests  BOTTLES DRAWN AEROBIC AND ANAEROBIC 5CC EACH  Final   Culture   Final    NO GROWTH 5 DAYS Performed at [facility]    Report Status 12/11/2014 FINAL  Final  Urine culture     Status: None   Collection Time: 12/04/14  9:31 PM  Result Value Ref Range Status   Specimen Description URINE, CLEAN CATCH  Final   Special Requests NONE  Final   Colony Count   Final    >=100,000 COLONIES/ML Performed at [facility]    Culture   Final    Multiple bacterial morphotypes present, none predominant. Suggest appropriate recollection if clinically indicated. Performed at [facility]    Report Status 12/05/2014 FINAL  Final  MRSA PCR Screening     Status: None   Collection Time: 12/05/14  4:23 AM  Result Value Ref Range Status   MRSA by PCR NEGATIVE NEGATIVE Final    Comment:        The GeneXpert MRSA Assay (FDA approved for NASAL specimens only), is one component of a comprehensive MRSA colonization surveillance program. It is not intended to diagnose MRSA infection nor to guide or monitor treatment for MRSA infections.   CSF culture     Status: None (Preliminary result)   Collection Time: 12/06/14 11:25 AM  Result Value Ref Range Status   Specimen Description CSF  Final   Special Requests Normal  Final   Gram Stain   Final    NO WBC SEEN NO ORGANISMS SEEN CYTOSPIN SLIDE Performed at [facility]    Culture NO GROWTH Performed at [facility]   Final   Report Status PENDING  Incomplete  Clostridium Difficile by PCR     Status: None   Collection Time: 12/07/14  4:28 PM  Result Value Ref Range Status   C difficile by pcr NEGATIVE NEGATIVE Final    Coagulation Studies: No results for input(s): LABPROT, INR in the last 72 hours.  Imaging: Mr Sherrin Daisy Contrast  12/11/2014   CLINICAL DATA:  Patient was found unresponsive at home. History of diabetes, hypertension, and polysubstance abuse. Subsequent encounter.  EXAM: MRI HEAD WITHOUT  CONTRAST  TECHNIQUE: Multiplanar, multiecho pulse sequences of the brain and surrounding structures were obtained without intravenous contrast.  COMPARISON:  12/05/2014 MR head.  CT head 12/04/2014.  FINDINGS: Redemonstrated are BILATERAL symmetric areas of restricted diffusion within the body and tail of the hippocampus extending into posterior cingulate gyrus. These are accompanied  by T2 and FLAIR hyperintensity. No other areas of restricted diffusion are observed.  Motion degraded axial images through the remainder of the brain demonstrates premature atrophy with chronic microvascular ischemic change. No definite hemorrhage. No mass lesion or hydrocephalus and no extra-axial fluid.  IMPRESSION: Stable abnormal brain MR with restricted diffusion and T2/FLAIR hyperintensity within the limbic system affecting the hippocampus primarily. Given the cerebrospinal fluid findings with elevated white count, but negative HSV, favor non-HSV viral encephalitis. The imaging pattern would be unusual for bacterial cerebritis despite CSF neutrophil predominance. No definite meningeal thickening on motion degraded FLAIR imaging.   Electronically Signed   By: Davonna Belling M.D.   On: 12/11/2014 15:00   Dg Chest Port 1 View  12/12/2014   CLINICAL DATA:  Respiratory failure.  EXAM: PORTABLE CHEST - 1 VIEW  COMPARISON:  12/11/2014.  FINDINGS: Endotracheal tube, left IJ line, NG tube in stable position. Subsegmental atelectasis in the lung bases. No pleural effusion or pneumothorax. Heart size is stable. No acute bony abnormality.  IMPRESSION: 1. Lines and tubes in stable position. 2. Mild basilar subsegmental atelectasis.   Electronically Signed   By: Maisie Fus  Register   On: 12/12/2014 07:16   Dg Chest Port 1 View  12/11/2014   CLINICAL DATA:  Respiratory failure.  EXAM: PORTABLE CHEST - 1 VIEW  COMPARISON:  12/10/2014  FINDINGS: Endotracheal tube remains implants with tip approximately 3 cm above the carina. Left jugular central  venous catheter is unchanged with tip overlying the lower SVC. Enteric tube courses towards the left upper abdomen with tip not imaged. Cardiac silhouette remains upper limits of normal in size. Mild central pulmonary vascular congestion is unchanged. No overt pulmonary edema, airspace consolidation, pleural effusion, or pneumothorax is identified.  IMPRESSION: Lines and tubes as above.  No airspace consolidation.   Electronically Signed   By: Sebastian Ache   On: 12/11/2014 07:09    Medications:  Scheduled: . amLODipine  10 mg Per Tube Daily  . antiseptic oral rinse  7 mL Mouth Rinse QID  . cefTRIAXone (ROCEPHIN)  IV  2 g Intravenous Q12H  . chlorhexidine  15 mL Mouth Rinse BID  . feeding supplement (PRO-STAT SUGAR FREE 64)  30 mL Oral BID  . feeding supplement (VITAL HIGH PROTEIN)  1,000 mL Per Tube Q24H  . folic acid  1 mg Per Tube Daily  . free water  250 mL Per Tube Q6H  . insulin aspart  0-20 Units Subcutaneous 6 times per day  . insulin glargine  8 Units Subcutaneous BID  . lactulose  20 g Per Tube Daily  . metoprolol tartrate  50 mg Per Tube BID  . pantoprazole sodium  40 mg Per Tube Q24H  . potassium chloride  40 mEq Oral Q3H  . thiamine  100 mg Per Tube Daily  . vancomycin  1,500 mg Intravenous Q48H    Assessment/Plan: 59 y.o. female admitted to the hospital after being found unresponsive at home. Initial work up revealed that CG was 54, elevated ammonia , and CT brain without acute abnormality. MRI brain 3/22 revealed signal abnormality on diffusion images involving the body and tail of the hippocampus extending into the posterior cingulate gyrus. No significant abnormal enhancement. Overall, such findings could represent post ictal changes, hypoglycemic encephalopathy, limbic encephalitis. EEG without electrographic seizures but a pattern most consistent with encephalopathy.  Unclear etiology of continued encephalopathy. CSF concerning for bacterial meningitis. Appreciate ID  input, will continue rocephin and vancomycin. CSF NMDA and  AMPA studies pending.    Will continue to follow.    Elspeth Cho, DO [facility] [phone]  If 7pm- 7am, please page neurology on call as listed in AMION.

## 2014-12-12 NOTE — Progress Notes (Signed)
PULMONARY / CRITICAL CARE MEDICINE   Name: Gina Cox MRN: [id number] DOB: 1955-10-08    ADMISSION DATE:  12/04/2014  REFERRING MD :  Madilyn Hook EDP  CHIEF COMPLAINT:  Found down  INITIAL PRESENTATION: 60 y/o female found unresponsive by family at home.  CBG 54 in ER, and intubated for airway protection >> Difficult airway.  STUDIES:  3/21 CT head >> no acute intracranial process 3/22 MRI Brain >> hippocampal diffusion signal abnormal to the posterior singulate gyrus  3/22 EEG >> no seizure activity 3/22 LP >> glucose 96, protein 42, WBC 128 (82%N), RBC 13 3/25 EEG >> toxic/metabolic encephalopathy.  No seizure activity 3/28 MRI Brain >> stable, abnormal brain MR with hyperintensity in limbic system affecting the hippocampus  SIGNIFICANT EVENTS: 3/21 Admit 3/22 Neurology consulted 3/24 ID consulted 3/26 acyclovir, ampicillin, decadron d/c'ed  SUBJECTIVE:   RN reports increased UOP, CBG 90's, low K  VITAL SIGNS: Temp:  [97.5 F (36.4 C)-97.7 F (36.5 C)] 97.6 F (36.4 C) (03/29 0800) Pulse Rate:  [66-92] 67 (03/29 0600) Resp:  [10-19] 11 (03/29 0600) BP: (135-164)/(47-65) 164/57 mmHg (03/29 0600) SpO2:  [98 %-100 %] 99 % (03/29 0600) FiO2 (%):  [30 %] 30 % (03/29 0400) Weight:  [232 lb 2.3 oz (105.3 kg)] 232 lb 2.3 oz (105.3 kg) (03/29 0252)   VENTILATOR SETTINGS: Vent Mode:  [-] PRVC FiO2 (%):  [30 %] 30 % Set Rate:  [14 bmp] 14 bmp Vt Set:  [440 mL] 440 mL PEEP:  [5 cmH20] 5 cmH20 Plateau Pressure:  [10 cmH20-18 cmH20] 11 cmH20   INTAKE / OUTPUT:  Intake/Output Summary (Last 24 hours) at 12/12/14 0943 Last data filed at 12/12/14 0800  Gross per 24 hour  Intake   4040 ml  Output   3700 ml  Net    340 ml   PHYSICAL EXAMINATION: General: no distress Neuro: opens eyes with stimulation, no following commands, resists passive movements HEENT: ETT in place, mm pink moist Cardiovascular: regular Lungs: even/non-labored, scattered rhonchi Abdomen: soft, non  tender Musculoskeletal: 2+ edema Skin: no rashes  LABS:  CBC  Recent Labs Lab 12/10/14 0315 12/11/14 0455 12/12/14 0445  WBC 15.7* 13.6* 8.7  HGB 8.2* 8.5* 8.7*  HCT 25.0* 25.6* 26.9*  PLT 215 237 236   Coag's No results for input(s): APTT, INR in the last 168 hours. BMET  Recent Labs Lab 12/10/14 1450 12/11/14 0455 12/12/14 0445  NA 145 148* 153*  K 2.9* 2.8* 2.7*  CL 107 110 109  CO2 23 24 33*  BUN 120* 117* 100*  CREATININE 4.47* 3.54* 2.41*  GLUCOSE 334* 180* 136*   Electrolytes  Recent Labs Lab 12/08/14 0515 12/09/14 0458 12/09/14 1737 12/10/14 0315 12/10/14 1450 12/11/14 0455 12/12/14 0445  CALCIUM 8.3* 8.7 8.3* 8.3* 8.3* 7.9* 8.6  MG 3.1* 3.0*  --   --   --  2.6*  --   PHOS 5.0* 5.8* 7.0* 6.1*  --   --   --    Sepsis Markers No results for input(s): LATICACIDVEN, PROCALCITON, O2SATVEN in the last 168 hours. ABG  Recent Labs Lab 12/09/14 1700 12/10/14 0400 12/11/14 0348  PHART 7.375 7.433 7.436  PCO2ART 29.4* 29.4* 37.7  PO2ART 128.0* 135.0* 122.0*   Liver Enzymes  Recent Labs Lab 12/09/14 1737 12/10/14 0315  AST  --  34  ALT  --  39*  ALKPHOS  --  72  BILITOT  --  0.2*  ALBUMIN 2.4* 2.4*  2.5*   Cardiac  Enzymes No results for input(s): TROPONINI, PROBNP in the last 168 hours. Glucose  Recent Labs Lab 12/11/14 1236 12/11/14 1604 12/11/14 2027 12/12/14 0042 12/12/14 0355 12/12/14 0757  GLUCAP 173* 181* 228* 196* 126* 90   Imaging Mr Brain Wo Contrast  12/11/2014   CLINICAL DATA:  Patient was found unresponsive at home. History of diabetes, hypertension, and polysubstance abuse. Subsequent encounter.  EXAM: MRI HEAD WITHOUT CONTRAST  TECHNIQUE: Multiplanar, multiecho pulse sequences of the brain and surrounding structures were obtained without intravenous contrast.  COMPARISON:  12/05/2014 MR head.  CT head 12/04/2014.  FINDINGS: Redemonstrated are BILATERAL symmetric areas of restricted diffusion within the body and tail  of the hippocampus extending into posterior cingulate gyrus. These are accompanied by T2 and FLAIR hyperintensity. No other areas of restricted diffusion are observed.  Motion degraded axial images through the remainder of the brain demonstrates premature atrophy with chronic microvascular ischemic change. No definite hemorrhage. No mass lesion or hydrocephalus and no extra-axial fluid.  IMPRESSION: Stable abnormal brain MR with restricted diffusion and T2/FLAIR hyperintensity within the limbic system affecting the hippocampus primarily. Given the cerebrospinal fluid findings with elevated white count, but negative HSV, favor non-HSV viral encephalitis. The imaging pattern would be unusual for bacterial cerebritis despite CSF neutrophil predominance. No definite meningeal thickening on motion degraded FLAIR imaging.   Electronically Signed   By: Davonna Belling M.D.   On: 12/11/2014 15:00   Dg Chest Port 1 View  12/11/2014   CLINICAL DATA:  Respiratory failure.  EXAM: PORTABLE CHEST - 1 VIEW  COMPARISON:  12/10/2014  FINDINGS: Endotracheal tube remains implants with tip approximately 3 cm above the carina. Left jugular central venous catheter is unchanged with tip overlying the lower SVC. Enteric tube courses towards the left upper abdomen with tip not imaged. Cardiac silhouette remains upper limits of normal in size. Mild central pulmonary vascular congestion is unchanged. No overt pulmonary edema, airspace consolidation, pleural effusion, or pneumothorax is identified.  IMPRESSION: Lines and tubes as above.  No airspace consolidation.   Electronically Signed   By: Sebastian Ache   On: 12/11/2014 07:09   ASSESSMENT / PLAN:  NEUROLOGIC A:   Acute encephalopathy >> concern for HSV encephalitis, meningitis, metabolic (hypoglycemia on admission, elevated ammonia, AKI). Hx of ETOH, cocaine abuse. R/O DI - increased UOP 3/29 P:   Monitor mental status RASS goal 0 F/u anti-NMDAR, anti-AMPA  Continue  lactulose Thiamine, folic acid  PULMONARY OETT 1/61 >>> A:  Acute respiratory failure secondary to inability to protect airway >> difficult airway. Tobacco abuse. P:   Pressure support wean as tolerated >> mental status prevents extubation trial F/u CXR  CARDIOVASCULAR A: Hx HTN, HLD. P:  Continue norvasc Increase metoprolol to 50 mg bid on 3/28 Hold outpt lisinopril, simvastatin, nebivolol  RENAL A:  AKI >> creatinine might be reaching plateau.  FENa 0.67 %, suggestive of pre-renal failure Gap/non gap metabolic acidosis >> improved 0/96. Hypokalemia. P:   Renal dose adjust meds Continue HCO3 in IV fluids >> decrease rate to 40 ml/hr on 3/27, consider discontinue gtt  Replace electrolytes as needed.  Follow K closely with bicarb gtt F/u BMET If no improvement in renal fx and urine outpt decrease, then will need nephrology evaluation >> defer for now  GASTROINTESTINAL A:  Nutrition. Diarrhea >> C diff negative 3/24. P:   TF on vent Protonix  HEMATOLOGIC A:  Anemia of critical illness. P:  F/u CBC SQ heparin for DVT prevention  INFECTIOUS A:  Possible meningitis. P:   Day 9 rocephin, vancomycin per ID  Blood 3/21 >> neg  ENDOCRINE A:  DM with renal complications. P:   SSI Change lantus to 8 units bid on 3/29 Hold outpt pioglitazone, metformin    Canary Brim, NP-C [facility] Pgr: [phone] or [phone]    12/12/2014, 9:43 AM

## 2014-12-12 NOTE — Progress Notes (Signed)
INFECTIOUS DISEASE PROGRESS NOTE  ID: Gina Cox is a 59 y.o. female with  Active Problems:   Acute encephalopathy   Altered mental status   Acute respiratory failure with hypoxia   Encephalitis   Acute respiratory failure   Bacterial meningitis  Subjective: Per RT is breathing on her own.  Opens her eyes to voice/command.   Abtx:  Anti-infectives    Start     Dose/Rate Route Frequency Ordered Stop   12/11/14 0700  vancomycin (VANCOCIN) 1,500 mg in sodium chloride 0.9 % 500 mL IVPB     1,500 mg 250 mL/hr over 120 Minutes Intravenous Every 48 hours 12/11/14 0609     12/08/14 1800  acyclovir (ZOVIRAX) 550 mg in dextrose 5 % 100 mL IVPB  Status:  Discontinued     550 mg 111 mL/hr over 60 Minutes Intravenous Every 24 hours 12/07/14 2243 12/09/14 1053   12/08/14 0600  ampicillin (OMNIPEN) 2 g in sodium chloride 0.9 % 50 mL IVPB  Status:  Discontinued     2 g 150 mL/hr over 20 Minutes Intravenous 3 times per day 12/07/14 2243 12/09/14 1053   12/07/14 1800  acyclovir (ZOVIRAX) 550 mg in dextrose 5 % 100 mL IVPB  Status:  Discontinued     550 mg 111 mL/hr over 60 Minutes Intravenous Every 12 hours 12/07/14 1253 12/07/14 2243   12/07/14 1400  ampicillin (OMNIPEN) 2 g in sodium chloride 0.9 % 50 mL IVPB  Status:  Discontinued     2 g 150 mL/hr over 20 Minutes Intravenous Every 6 hours 12/07/14 1253 12/07/14 2243   12/07/14 1000  vancomycin (VANCOCIN) 500 mg in sodium chloride 0.9 % 100 mL IVPB  Status:  Discontinued     500 mg 100 mL/hr over 60 Minutes Intravenous Every 12 hours 12/06/14 2227 12/07/14 1255   12/05/14 2000  cefTRIAXone (ROCEPHIN) 2 g in dextrose 5 % 50 mL IVPB  Status:  Discontinued     2 g 100 mL/hr over 30 Minutes Intravenous Every 24 hours 12/04/14 2022 12/05/14 0051   12/05/14 1000  vancomycin (VANCOCIN) IVPB 750 mg/150 ml premix     750 mg 150 mL/hr over 60 Minutes Intravenous Every 12 hours 12/05/14 0105 12/06/14 2329   12/05/14 0800  cefTRIAXone  (ROCEPHIN) 2 g in dextrose 5 % 50 mL IVPB - Premix     2 g 100 mL/hr over 30 Minutes Intravenous Every 12 hours 12/05/14 0105     12/05/14 0115  ampicillin (OMNIPEN) 2 g in sodium chloride 0.9 % 50 mL IVPB  Status:  Discontinued     2 g 150 mL/hr over 20 Minutes Intravenous 6 times per day 12/05/14 0105 12/07/14 1253   12/05/14 0115  acyclovir (ZOVIRAX) 550 mg in dextrose 5 % 100 mL IVPB  Status:  Discontinued     550 mg 111 mL/hr over 60 Minutes Intravenous 3 times per day 12/05/14 0107 12/07/14 1253   12/04/14 2300  vancomycin (VANCOCIN) 2,000 mg in sodium chloride 0.9 % 500 mL IVPB  Status:  Discontinued     2,000 mg 250 mL/hr over 120 Minutes Intravenous  Once 12/04/14 2205 12/05/14 0051   12/04/14 2015  cefTRIAXone (ROCEPHIN) 2 g in dextrose 5 % 50 mL IVPB     2 g 100 mL/hr over 30 Minutes Intravenous  Once 12/04/14 2009 12/04/14 2100      Medications:  Scheduled: . amLODipine  10 mg Per Tube Daily  . antiseptic oral rinse  7  mL Mouth Rinse QID  . cefTRIAXone (ROCEPHIN)  IV  2 g Intravenous Q12H  . chlorhexidine  15 mL Mouth Rinse BID  . feeding supplement (PRO-STAT SUGAR FREE 64)  30 mL Oral BID  . feeding supplement (VITAL HIGH PROTEIN)  1,000 mL Per Tube Q24H  . folic acid  1 mg Per Tube Daily  . free water  250 mL Per Tube Q6H  . insulin aspart  0-20 Units Subcutaneous 6 times per day  . insulin glargine  8 Units Subcutaneous BID  . lactulose  20 g Per Tube Daily  . metoprolol tartrate  50 mg Per Tube BID  . pantoprazole sodium  40 mg Per Tube Q24H  . potassium chloride  40 mEq Oral Q3H  . thiamine  100 mg Per Tube Daily  . vancomycin  1,500 mg Intravenous Q48H    Objective: Vital signs in last 24 hours: Temp:  [97.5 F (36.4 C)-97.7 F (36.5 C)] 97.6 F (36.4 C) (03/29 0800) Pulse Rate:  [66-92] 80 (03/29 0800) Resp:  [11-19] 16 (03/29 0800) BP: (95-164)/(39-65) 95/39 mmHg (03/29 0800) SpO2:  [99 %-100 %] 100 % (03/29 0800) FiO2 (%):  [30 %] 30 % (03/29  0800) Weight:  [105.3 kg (232 lb 2.3 oz)] 105.3 kg (232 lb 2.3 oz) (03/29 0252)   General appearance: on vent, some response Neck: L neck IJ, clean.  Resp: rhonchi bilaterally and mild Cardio: regular rate and rhythm GI: normal findings: bowel sounds normal and soft, non-tender Extremities: edema anasarca  Lab Results  Recent Labs  12/11/14 0455 12/12/14 0445  WBC 13.6* 8.7  HGB 8.5* 8.7*  HCT 25.6* 26.9*  NA 148* 153*  K 2.8* 2.7*  CL 110 109  CO2 24 33*  BUN 117* 100*  CREATININE 3.54* 2.41*   Liver Panel  Recent Labs  12/09/14 1737 12/10/14 0315  PROT  --  6.4  ALBUMIN 2.4* 2.4*  2.5*  AST  --  34  ALT  --  39*  ALKPHOS  --  72  BILITOT  --  0.2*  BILIDIR  --  <0.1  IBILI  --  NOT CALCULATED   Sedimentation Rate No results for input(s): ESRSEDRATE in the last 72 hours. C-Reactive Protein No results for input(s): CRP in the last 72 hours.  Microbiology: Recent Results (from the past 240 hour(s))  Blood Culture (routine x 2)     Status: None   Collection Time: 12/04/14  8:10 PM  Result Value Ref Range Status   Specimen Description BLOOD RIGHT WRIST  Final   Special Requests BOTTLES DRAWN AEROBIC AND ANAEROBIC 5CC EACH  Final   Culture   Final    NO GROWTH 5 DAYS Performed at [facility]    Report Status 12/11/2014 FINAL  Final  Blood Culture (routine x 2)     Status: None   Collection Time: 12/04/14  8:11 PM  Result Value Ref Range Status   Specimen Description BLOOD LEFT ARM  Final   Special Requests BOTTLES DRAWN AEROBIC AND ANAEROBIC 5CC EACH  Final   Culture   Final    NO GROWTH 5 DAYS Performed at [facility]    Report Status 12/11/2014 FINAL  Final  Urine culture     Status: None   Collection Time: 12/04/14  9:31 PM  Result Value Ref Range Status   Specimen Description URINE, CLEAN CATCH  Final   Special Requests NONE  Final   Colony Count   Final    >=  100,000 COLONIES/ML Performed at [facility]   Final    Multiple bacterial morphotypes present, none predominant. Suggest appropriate recollection if clinically indicated. Performed at [facility]    Report Status 12/05/2014 FINAL  Final  MRSA PCR Screening     Status: None   Collection Time: 12/05/14  4:23 AM  Result Value Ref Range Status   MRSA by PCR NEGATIVE NEGATIVE Final    Comment:        The GeneXpert MRSA Assay (FDA approved for NASAL specimens only), is one component of a comprehensive MRSA colonization surveillance program. It is not intended to diagnose MRSA infection nor to guide or monitor treatment for MRSA infections.   CSF culture     Status: None (Preliminary result)   Collection Time: 12/06/14 11:25 AM  Result Value Ref Range Status   Specimen Description CSF  Final   Special Requests Normal  Final   Gram Stain   Final    NO WBC SEEN NO ORGANISMS SEEN CYTOSPIN SLIDE Performed at [facility]    Culture NO GROWTH Performed at [facility]   Final   Report Status PENDING  Incomplete  Clostridium Difficile by PCR     Status: None   Collection Time: 12/07/14  4:28 PM  Result Value Ref Range Status   C difficile by pcr NEGATIVE NEGATIVE Final    Studies/Results: Mr Brain Wo Contrast  12/11/2014   CLINICAL DATA:  Patient was found unresponsive at home. History of diabetes, hypertension, and polysubstance abuse. Subsequent encounter.  EXAM: MRI HEAD WITHOUT CONTRAST  TECHNIQUE: Multiplanar, multiecho pulse sequences of the brain and surrounding structures were obtained without intravenous contrast.  COMPARISON:  12/05/2014 MR head.  CT head 12/04/2014.  FINDINGS: Redemonstrated are BILATERAL symmetric areas of restricted diffusion within the body and tail of the hippocampus extending into posterior cingulate gyrus. These are accompanied by T2 and FLAIR hyperintensity. No other areas of restricted diffusion are observed.  Motion degraded axial images through the remainder  of the brain demonstrates premature atrophy with chronic microvascular ischemic change. No definite hemorrhage. No mass lesion or hydrocephalus and no extra-axial fluid.  IMPRESSION: Stable abnormal brain MR with restricted diffusion and T2/FLAIR hyperintensity within the limbic system affecting the hippocampus primarily. Given the cerebrospinal fluid findings with elevated white count, but negative HSV, favor non-HSV viral encephalitis. The imaging pattern would be unusual for bacterial cerebritis despite CSF neutrophil predominance. No definite meningeal thickening on motion degraded FLAIR imaging.   Electronically Signed   By: Davonna Belling M.D.   On: 12/11/2014 15:00   Dg Chest Port 1 View  12/12/2014   CLINICAL DATA:  Respiratory failure.  EXAM: PORTABLE CHEST - 1 VIEW  COMPARISON:  12/11/2014.  FINDINGS: Endotracheal tube, left IJ line, NG tube in stable position. Subsegmental atelectasis in the lung bases. No pleural effusion or pneumothorax. Heart size is stable. No acute bony abnormality.  IMPRESSION: 1. Lines and tubes in stable position. 2. Mild basilar subsegmental atelectasis.   Electronically Signed   By: Maisie Fus  Register   On: 12/12/2014 07:16   Dg Chest Port 1 View  12/11/2014   CLINICAL DATA:  Respiratory failure.  EXAM: PORTABLE CHEST - 1 VIEW  COMPARISON:  12/10/2014  FINDINGS: Endotracheal tube remains implants with tip approximately 3 cm above the carina. Left jugular central venous catheter is unchanged with tip overlying the lower SVC. Enteric tube courses towards the left upper abdomen with tip  not imaged. Cardiac silhouette remains upper limits of normal in size. Mild central pulmonary vascular congestion is unchanged. No overt pulmonary edema, airspace consolidation, pleural effusion, or pneumothorax is identified.  IMPRESSION: Lines and tubes as above.  No airspace consolidation.   Electronically Signed   By: Sebastian Ache   On: 12/11/2014 07:09     Assessment/Plan: Meningitis  (unknown cause) ARF DM  Total days of antibiotics: 9 (vanco/ceftriaxone). Completed steroids  CSF Cx sent, delayed, ngtd HSV (-) Serum and CSF syphillis studies (-)   NMDA pending.  ANA, ANCA pending.   Consider sending viral encephalitis panel to state lab (paired csf and spinal fluid). Will need more CSF.   Add RMSF, Ehrlichia (although she has normal PLT and LFTS).   Aim for 14 days of ceftriaxone/vanco    Johny Sax Infectious Diseases (pager) [phone] [email or web address] 12/12/2014, 10:23 AM  LOS: 8 days

## 2014-12-13 ENCOUNTER — Inpatient Hospital Stay (HOSPITAL_COMMUNITY): Payer: Medicare Other

## 2014-12-13 LAB — COMPREHENSIVE METABOLIC PANEL
ALT: 157 U/L — AB (ref 0–35)
AST: 120 U/L — ABNORMAL HIGH (ref 0–37)
Albumin: 2.1 g/dL — ABNORMAL LOW (ref 3.5–5.2)
Alkaline Phosphatase: 65 U/L (ref 39–117)
Anion gap: 8 (ref 5–15)
BUN: 79 mg/dL — ABNORMAL HIGH (ref 6–23)
CALCIUM: 8.4 mg/dL (ref 8.4–10.5)
CO2: 33 mmol/L — ABNORMAL HIGH (ref 19–32)
Chloride: 114 mmol/L — ABNORMAL HIGH (ref 96–112)
Creatinine, Ser: 1.63 mg/dL — ABNORMAL HIGH (ref 0.50–1.10)
GFR, EST AFRICAN AMERICAN: 39 mL/min — AB (ref >90)
GFR, EST NON AFRICAN AMERICAN: 33 mL/min — AB (ref >90)
GLUCOSE: 171 mg/dL — AB (ref 70–99)
Potassium: 4 mmol/L (ref 3.5–5.1)
Sodium: 155 mmol/L — ABNORMAL HIGH (ref 135–145)
TOTAL PROTEIN: 5.5 g/dL — AB (ref 6.0–8.3)
Total Bilirubin: 0.2 mg/dL — ABNORMAL LOW (ref 0.3–1.2)

## 2014-12-13 LAB — VANCOMYCIN, RANDOM: Vancomycin Rm: 18.7 ug/mL

## 2014-12-13 LAB — GLUCOSE, CAPILLARY
GLUCOSE-CAPILLARY: 146 mg/dL — AB (ref 70–99)
Glucose-Capillary: 196 mg/dL — ABNORMAL HIGH (ref 70–99)
Glucose-Capillary: 148 mg/dL — ABNORMAL HIGH (ref 70–99)
Glucose-Capillary: 229 mg/dL — ABNORMAL HIGH (ref 70–99)
Glucose-Capillary: 170 mg/dL — ABNORMAL HIGH (ref 70–99)
Glucose-Capillary: 144 mg/dL — ABNORMAL HIGH (ref 70–99)
Glucose-Capillary: 145 mg/dL — ABNORMAL HIGH (ref 70–99)

## 2014-12-13 LAB — EHRLICHIA ANTIBODY PANEL
E CHAFFEENSIS AB, IGG: NEGATIVE
E chaffeensis (HGE) Ab, IgM: NEGATIVE
E. CHAFFEENSIS IGG AB: NEGATIVE
E. Chaffeensis (HME) IgM Titer: NEGATIVE

## 2014-12-13 LAB — ROCKY MTN SPOTTED FVR ABS PNL(IGG+IGM)
RMSF IgG: NEGATIVE
RMSF IgM: 0.32 index (ref 0.00–0.89)

## 2014-12-13 LAB — ANCA TITERS
Atypical P-ANCA titer: <1:20 {titer}
C-ANCA: <1:20 {titer}
P-ANCA: <1:20 {titer}

## 2014-12-13 LAB — CBC
HCT: 24 % — ABNORMAL LOW (ref 36.0–46.0)
Hemoglobin: 7.6 g/dL — ABNORMAL LOW (ref 12.0–15.0)
MCH: 30.5 pg (ref 26.0–34.0)
MCHC: 31.7 g/dL (ref 30.0–36.0)
MCV: 96.4 fL (ref 78.0–100.0)
Platelets: 183 10*3/uL (ref 150–400)
RBC: 2.49 MIL/uL — AB (ref 3.87–5.11)
RDW: 14.8 % (ref 11.5–15.5)
WBC: 8.8 10*3/uL (ref 4.0–10.5)

## 2014-12-13 LAB — OSMOLALITY: Osmolality: 345 mOsm/kg — ABNORMAL HIGH (ref 275–300)

## 2014-12-13 LAB — ANTINUCLEAR ANTIBODIES, IFA: ANA Ab, IFA: NEGATIVE

## 2014-12-13 MED ORDER — VANCOMYCIN HCL IN DEXTROSE 1-5 GM/200ML-% IV SOLN
1000.0000 mg | Freq: Once | INTRAVENOUS | Status: AC
  Administered 2014-12-13: 1000 mg via INTRAVENOUS
  Filled 2014-12-13: qty 200

## 2014-12-13 MED ORDER — VANCOMYCIN HCL IN DEXTROSE 1-5 GM/200ML-% IV SOLN: 1000.0000 mg | Freq: Once | INTRAVENOUS | Status: DC

## 2014-12-13 NOTE — Progress Notes (Signed)
INFECTIOUS DISEASE PROGRESS NOTE  ID: Gina Cox is a 59 y.o. female with  Active Problems:   Acute encephalopathy   Altered mental status   Acute respiratory failure with hypoxia   Encephalitis   Acute respiratory failure   Bacterial meningitis   Respiratory failure  Subjective: No response, on vent.   Abtx:  Anti-infectives    Start     Dose/Rate Route Frequency Ordered Stop   12/13/14 0945  vancomycin (VANCOCIN) IVPB 1000 mg/200 mL premix     1,000 mg 200 mL/hr over 60 Minutes Intravenous  Once 12/13/14 0944 12/13/14 1124   12/13/14 0900  vancomycin (VANCOCIN) IVPB 1000 mg/200 mL premix  Status:  Discontinued     1,000 mg 200 mL/hr over 60 Minutes Intravenous  Once 12/13/14 0835 12/13/14 0941   12/11/14 0700  vancomycin (VANCOCIN) 1,500 mg in sodium chloride 0.9 % 500 mL IVPB  Status:  Discontinued     1,500 mg 250 mL/hr over 120 Minutes Intravenous Every 48 hours 12/11/14 0609 12/12/14 1242   12/08/14 1800  acyclovir (ZOVIRAX) 550 mg in dextrose 5 % 100 mL IVPB  Status:  Discontinued     550 mg 111 mL/hr over 60 Minutes Intravenous Every 24 hours 12/07/14 2243 12/09/14 1053   12/08/14 0600  ampicillin (OMNIPEN) 2 g in sodium chloride 0.9 % 50 mL IVPB  Status:  Discontinued     2 g 150 mL/hr over 20 Minutes Intravenous 3 times per day 12/07/14 2243 12/09/14 1053   12/07/14 1800  acyclovir (ZOVIRAX) 550 mg in dextrose 5 % 100 mL IVPB  Status:  Discontinued     550 mg 111 mL/hr over 60 Minutes Intravenous Every 12 hours 12/07/14 1253 12/07/14 2243   12/07/14 1400  ampicillin (OMNIPEN) 2 g in sodium chloride 0.9 % 50 mL IVPB  Status:  Discontinued     2 g 150 mL/hr over 20 Minutes Intravenous Every 6 hours 12/07/14 1253 12/07/14 2243   12/07/14 1000  vancomycin (VANCOCIN) 500 mg in sodium chloride 0.9 % 100 mL IVPB  Status:  Discontinued     500 mg 100 mL/hr over 60 Minutes Intravenous Every 12 hours 12/06/14 2227 12/07/14 1255   12/05/14 2000  cefTRIAXone  (ROCEPHIN) 2 g in dextrose 5 % 50 mL IVPB  Status:  Discontinued     2 g 100 mL/hr over 30 Minutes Intravenous Every 24 hours 12/04/14 2022 12/05/14 0051   12/05/14 1000  vancomycin (VANCOCIN) IVPB 750 mg/150 ml premix     750 mg 150 mL/hr over 60 Minutes Intravenous Every 12 hours 12/05/14 0105 12/06/14 2329   12/05/14 0800  cefTRIAXone (ROCEPHIN) 2 g in dextrose 5 % 50 mL IVPB - Premix     2 g 100 mL/hr over 30 Minutes Intravenous Every 12 hours 12/05/14 0105     12/05/14 0115  ampicillin (OMNIPEN) 2 g in sodium chloride 0.9 % 50 mL IVPB  Status:  Discontinued     2 g 150 mL/hr over 20 Minutes Intravenous 6 times per day 12/05/14 0105 12/07/14 1253   12/05/14 0115  acyclovir (ZOVIRAX) 550 mg in dextrose 5 % 100 mL IVPB  Status:  Discontinued     550 mg 111 mL/hr over 60 Minutes Intravenous 3 times per day 12/05/14 0107 12/07/14 1253   12/04/14 2300  vancomycin (VANCOCIN) 2,000 mg in sodium chloride 0.9 % 500 mL IVPB  Status:  Discontinued     2,000 mg 250 mL/hr over 120 Minutes Intravenous  Once 12/04/14 2205 12/05/14 0051   12/04/14 2015  cefTRIAXone (ROCEPHIN) 2 g in dextrose 5 % 50 mL IVPB     2 g 100 mL/hr over 30 Minutes Intravenous  Once 12/04/14 2009 12/04/14 2100      Medications:  Scheduled: . amLODipine  10 mg Per Tube Daily  . antiseptic oral rinse  7 mL Mouth Rinse QID  . cefTRIAXone (ROCEPHIN)  IV  2 g Intravenous Q12H  . chlorhexidine  15 mL Mouth Rinse BID  . feeding supplement (PRO-STAT SUGAR FREE 64)  30 mL Oral BID  . feeding supplement (VITAL HIGH PROTEIN)  1,000 mL Per Tube Q24H  . folic acid  1 mg Per Tube Daily  . free water  250 mL Per Tube Q6H  . insulin aspart  0-20 Units Subcutaneous 6 times per day  . insulin glargine  8 Units Subcutaneous BID  . lactulose  20 g Per Tube Daily  . metoprolol tartrate  50 mg Per Tube BID  . pantoprazole sodium  40 mg Per Tube Q24H  . thiamine  100 mg Per Tube Daily    Objective: Vital signs in last 24  hours: Temp:  [97.4 F (36.3 C)-99.4 F (37.4 C)] 99.4 F (37.4 C) (03/30 1159) Pulse Rate:  [75-93] 88 (03/30 1500) Resp:  [14-15] 14 (03/30 0327) BP: (103-158)/(30-55) 142/55 mmHg (03/30 1400) SpO2:  [97 %-100 %] 99 % (03/30 1500) FiO2 (%):  [30 %] 30 % (03/30 1300) Weight:  [108.2 kg (238 lb 8.6 oz)] 108.2 kg (238 lb 8.6 oz) (03/30 0452)   General appearance: no distress Resp: clear to auscultation bilaterally Cardio: regular rate and rhythm GI: normal findings: bowel sounds normal, soft, non-tender and distended  Lab Results  Recent Labs  12/12/14 0445 12/12/14 1844 12/13/14 0400  WBC 8.7  --  8.8  HGB 8.7*  --  7.6*  HCT 26.9*  --  24.0*  NA 153* 153* 155*  K 2.7* 4.6 4.0  CL 109 112 114*  CO2 33* 34* 33*  BUN 100* 86* 79*  CREATININE 2.41* 1.85* 1.63*   Liver Panel  Recent Labs  12/13/14 0400  PROT 5.5*  ALBUMIN 2.1*  AST 120*  ALT 157*  ALKPHOS 65  BILITOT 0.2*   Sedimentation Rate No results for input(s): ESRSEDRATE in the last 72 hours. C-Reactive Protein No results for input(s): CRP in the last 72 hours.  Microbiology: Recent Results (from the past 240 hour(s))  Blood Culture (routine x 2)     Status: None   Collection Time: 12/04/14  8:10 PM  Result Value Ref Range Status   Specimen Description BLOOD RIGHT WRIST  Final   Special Requests BOTTLES DRAWN AEROBIC AND ANAEROBIC 5CC EACH  Final   Culture   Final    NO GROWTH 5 DAYS Performed at [facility]    Report Status 12/11/2014 FINAL  Final  Blood Culture (routine x 2)     Status: None   Collection Time: 12/04/14  8:11 PM  Result Value Ref Range Status   Specimen Description BLOOD LEFT ARM  Final   Special Requests BOTTLES DRAWN AEROBIC AND ANAEROBIC 5CC EACH  Final   Culture   Final    NO GROWTH 5 DAYS Performed at [facility]    Report Status 12/11/2014 FINAL  Final  Urine culture     Status: None   Collection Time: 12/04/14  9:31 PM  Result Value Ref Range  Status   Specimen  Description URINE, CLEAN CATCH  Final   Special Requests NONE  Final   Colony Count   Final    >=100,000 COLONIES/ML Performed at [facility]    Culture   Final    Multiple bacterial morphotypes present, none predominant. Suggest appropriate recollection if clinically indicated. Performed at [facility]    Report Status 12/05/2014 FINAL  Final  MRSA PCR Screening     Status: None   Collection Time: 12/05/14  4:23 AM  Result Value Ref Range Status   MRSA by PCR NEGATIVE NEGATIVE Final    Comment:        The GeneXpert MRSA Assay (FDA approved for NASAL specimens only), is one component of a comprehensive MRSA colonization surveillance program. It is not intended to diagnose MRSA infection nor to guide or monitor treatment for MRSA infections.   CSF culture     Status: None (Preliminary result)   Collection Time: 12/06/14 11:25 AM  Result Value Ref Range Status   Specimen Description CSF  Final   Special Requests Normal  Final   Gram Stain   Final    NO WBC SEEN NO ORGANISMS SEEN CYTOSPIN SLIDE Performed at [facility]    Culture NO GROWTH Performed at [facility]   Final   Report Status PENDING  Incomplete  Clostridium Difficile by PCR     Status: None   Collection Time: 12/07/14  4:28 PM  Result Value Ref Range Status   C difficile by pcr NEGATIVE NEGATIVE Final    Studies/Results: Dg Chest Port 1 View  12/13/2014   CLINICAL DATA:  Acute respiratory failure  EXAM: PORTABLE CHEST - 1 VIEW  COMPARISON:  12/12/2014  FINDINGS: Endotracheal tube in good position. Left jugular catheter tip in the SVC at the cavoatrial junction unchanged. NG tube enters the stomach with the tip not visualized.  Bibasilar airspace disease with mild progression. Negative for edema or effusion.  IMPRESSION: Support lines remain in good position.  Progression of bibasilar atelectasis/ infiltrate right greater than left.   Electronically  Signed   By: Marlan Palau M.D.   On: 12/13/2014 07:22   Dg Chest Port 1 View  12/12/2014   CLINICAL DATA:  Respiratory failure.  EXAM: PORTABLE CHEST - 1 VIEW  COMPARISON:  12/11/2014.  FINDINGS: Endotracheal tube, left IJ line, NG tube in stable position. Subsegmental atelectasis in the lung bases. No pleural effusion or pneumothorax. Heart size is stable. No acute bony abnormality.  IMPRESSION: 1. Lines and tubes in stable position. 2. Mild basilar subsegmental atelectasis.   Electronically Signed   By: Maisie Fus  Register   On: 12/12/2014 07:16     Assessment/Plan: Meningitis (unknown cause) ARF DM Protein-albumin malnutrition  Total days of antibiotics: 10 (vanco/ceftriaxone). Completed steroids  NMDA, ANCA pending (ANA-) She has some improvement in her breathing.  Frozen CSF sample is still ngtd.  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  Will aim to complete 14 days of anbx.          Johny Sax Infectious Diseases (pager) [phone] [email or web address] 12/13/2014, 3:54 PM  LOS: 9 days

## 2014-12-13 NOTE — Progress Notes (Signed)
GOC:  Extensive discussion with daughter Venita Sheffield # [phone]) and patients niece regarding current medical status.  Updated family on ongoing work up and clinical suspicion for diabetic encephalopathy + AKI as source for AMS and that we are also looking for other etiologies for ongoing encephalopathy.  The daughter reports patient has a family history of lupus and also has recently been having difficulties with her blood sugars (up/down).  She potentially could have been unresponsive at home for up to 12-14 hours prior to being found.  They want to continue to support her to allow for sufficient time to complete work up.  If this requires a tracheostomy / PEG they are willing to accept that if it means a bridge to returning to her prior life.  However, if she is not making progress (even after trach/PEG) they are open to comfort measures as quality of life would be very important to the patient.      Canary Brim, NP-C [facility] Pgr: [phone] or [phone]

## 2014-12-13 NOTE — Progress Notes (Signed)
ANTIBIOTIC CONSULT NOTE - FOLLOW UP  Pharmacy Consult for rocephin, vancomycin Indication: suspected meningitis  No Known Allergies  Patient Measurements: Height:  (162.6 cm) Weight: 238 lb 8.6 oz (108.2 kg) IBW/kg (Calculated) : 54.7  Vital Signs: Temp: 99.4 F (37.4 C) (03/30 0800) Temp Source: Oral (03/30 0800) BP: 147/46 mmHg (03/30 0800) Pulse Rate: 89 (03/30 0800) Intake/Output from previous day: 03/29 0701 - 03/30 0700 In: 1660 [I.V.:1400; NG/GT:160; IV Piggyback:100] Out: 2550 [Urine:1850; Stool:700] Intake/Output from this shift: Total I/O In: 115 [I.V.:25; NG/GT:40; IV Piggyback:50] Out: 1000 [Urine:1000]  Labs:  Recent Labs  12/11/14 0455 12/12/14 0445 12/12/14 1844 12/13/14 0400  WBC 13.6* 8.7  --  8.8  HGB 8.5* 8.7*  --  7.6*  PLT 237 236  --  183  CREATININE 3.54* 2.41* 1.85* 1.63*   Estimated Creatinine Clearance: 44.1 mL/min (by C-G formula based on Cr of 1.63).  Recent Labs  12/12/14 1100 12/13/14 0400  VANCORANDOM 27.6 18.7     Microbiology: Recent Results (from the past 720 hour(s))  Blood Culture (routine x 2)     Status: None   Collection Time: 12/04/14  8:10 PM  Result Value Ref Range Status   Specimen Description BLOOD RIGHT WRIST  Final   Special Requests BOTTLES DRAWN AEROBIC AND ANAEROBIC 5CC EACH  Final   Culture   Final    NO GROWTH 5 DAYS Performed at [facility]    Report Status 12/11/2014 FINAL  Final  Blood Culture (routine x 2)     Status: None   Collection Time: 12/04/14  8:11 PM  Result Value Ref Range Status   Specimen Description BLOOD LEFT ARM  Final   Special Requests BOTTLES DRAWN AEROBIC AND ANAEROBIC 5CC EACH  Final   Culture   Final    NO GROWTH 5 DAYS Performed at [facility]    Report Status 12/11/2014 FINAL  Final  Urine culture     Status: None   Collection Time: 12/04/14  9:31 PM  Result Value Ref Range Status   Specimen Description URINE, CLEAN CATCH  Final   Special  Requests NONE  Final   Colony Count   Final    >=100,000 COLONIES/ML Performed at [facility]    Culture   Final    Multiple bacterial morphotypes present, none predominant. Suggest appropriate recollection if clinically indicated. Performed at [facility]    Report Status 12/05/2014 FINAL  Final  MRSA PCR Screening     Status: None   Collection Time: 12/05/14  4:23 AM  Result Value Ref Range Status   MRSA by PCR NEGATIVE NEGATIVE Final    Comment:        The GeneXpert MRSA Assay (FDA approved for NASAL specimens only), is one component of a comprehensive MRSA colonization surveillance program. It is not intended to diagnose MRSA infection nor to guide or monitor treatment for MRSA infections.   CSF culture     Status: None (Preliminary result)   Collection Time: 12/06/14 11:25 AM  Result Value Ref Range Status   Specimen Description CSF  Final   Special Requests Normal  Final   Gram Stain   Final    NO WBC SEEN NO ORGANISMS SEEN CYTOSPIN SLIDE Performed at [facility]    Culture NO GROWTH Performed at [facility]   Final   Report Status PENDING  Incomplete  Clostridium Difficile by PCR     Status: None   Collection  Time: 12/07/14  4:28 PM  Result Value Ref Range Status   C difficile by pcr NEGATIVE NEGATIVE Final    Assessment: 58 y.oF with PMH of DM, HTN, HLD who presents with hypoglycemia and AMS after being found unresponsive at home.  She's currently on abx for likely bacterial meningitis.    3/21 >> Ceftriaxone >> 3/21 >> vanc >> 3/22 >> ampicillin >> 3/26 3/22 >> acyclovir for r/o HSV >> 3/26  3/24 >> decadron for meningitis>>3/26  Tmax: afebrile WBCs: down 15.7 Renal: new AKI, SCr trending down, now 2.41; CrCl 40 ml/min (CG), 27 ml/min (normalized) (could be d/t acyclovir; however, FeNa suggestive of pre-renal failure), UOP good, hydrating   3/21 blood x 2: NGF 3/21 urine: >100k col/ml, none predominant  FINAL 3/23 CSF: high gluc, nml protein, high WBC; non-cloudy 3/23 CSF culture: no growth, pending 3/22 HIV/RPR both negative 3/22 VDRL CSF: IP 3/24 cdiff (-) 3/23: HSV PCR (-) 3/29: rocky mtn spotted fvr: IP 3/29: Ehrlichia antibody panel: IP 3/29: antinuclear antibodies: IP  3/23 LP: glucose 96 (slightly high), protein 42, wbc high 128, neutrophils high 82  Drug level / dose changes info: 3/23 2100 VT = 20.9 on 750 mg IV q12, changed to 500 mg q12h 3/24 2100 VT = 37.4 after one dose of 500 q12 (HOLD) 3/25 2245 VT = 33.4 (cont to hold) 3/26 2246 Vanc level = 26.9 (cont to hold) 3/27 2200 Vanc level = 20.1 (cont to hold) 3/28 0500 Vanc level = 17 ==> Vanc 1500mg  x 1 3/29 1000 Vanc level = 27.6 3/30 0400 Vanc level = 18.7 ==> Vanc 1g x 1, ke 0.0229  Today is day #10 Vanc (dosing based on random levels) and Ceftriaxone 2g IV q12h for likely bacterial meningitis.  Goal of Therapy:  Vancomycin trough level 15-20 mcg/ml  Doses adjusted per renal function Eradication of infection  Plan:  1.  Vancomycin currently being dosed based on levels.  Give Vancomycin 1g IV x 1 this AM.  Currently estimating half life ~30 hours but SCr continues to improve so anticipate will need another dose as soon as tomorrow morning. 2.  Continue Ceftriaxone 2g IV q12h.  No renal adjustments. 3.  Continue to monitor SCr and f/u ID recommendations.  [name], PharmD, BCPS Pager: [phone] 12/13/2014 10:06 AM

## 2014-12-13 NOTE — Progress Notes (Signed)
Subjective: Continues on Rocephin and Vancomycin. No overnight events. Afebrile, BUN/Cr trending down. No leukocytosis.   CSF NMDA and AMPA studies pending.   Objective: Current vital signs: BP 147/46 mmHg  Pulse 89  Temp(Src) 99.4 F (37.4 C) (Oral)  Resp 14  Ht 5\' 4"  (1.626 m)  Wt 108.2 kg (238 lb 8.6 oz)  BMI 40.92 kg/m2  SpO2 99% Vital signs in last 24 hours: Temp:  [97.4 F (36.3 C)-99.4 F (37.4 C)] 99.4 F (37.4 C) (03/30 0800) Pulse Rate:  [75-89] 89 (03/30 0800) Resp:  [14-15] 14 (03/30 0327) BP: (103-158)/(30-55) 147/46 mmHg (03/30 0800) SpO2:  [97 %-100 %] 99 % (03/30 0800) FiO2 (%):  [30 %] 30 % (03/30 0800) Weight:  [108.2 kg (238 lb 8.6 oz)] 108.2 kg (238 lb 8.6 oz) (03/30 0452)  Intake/Output from previous day: 03/29 0701 - 03/30 0700 In: 1660 [I.V.:1400; NG/GT:160; IV Piggyback:100] Out: 2550 [Urine:1850; Stool:700] Intake/Output this shift: Total I/O In: 115 [I.V.:25; NG/GT:40; IV Piggyback:50] Out: 1000 [Urine:1000] Nutritional status:    Neurologic Exam: Patient is off sedatives, intubated on the vent. Follows no commands. Eyes are open, does not make eye contact or track examiner. Will open eyes to voice/noxious stimuli.  CN 2-12: pupils 4 mm bilaterally reactive, no gaze preference, no nystagmus. Decreased blink to threat bilaterally.Corneal responses present, face symmetric, tongue: intubated. Motor: no spontaneous movement Sensory: reacts to pain. But does not localize or withdrawal.  Plantars: right upgoing,  left downgoing. Coordination and gait: unable to test. Neck supple.  Lab Results: Basic Metabolic Panel:  Recent Labs Lab 12/07/14 0525  12/08/14 0515 12/09/14 0458 12/09/14 1737 12/10/14 0315 12/10/14 1450 12/11/14 0455 12/12/14 0445 12/12/14 1844 12/13/14 0400  NA 147*  < > 145 147* 140 145 145 148* 153* 153* 155*  K 3.5  < > 3.4* 3.2* 4.6 3.0* 2.9* 2.8* 2.7* 4.6 4.0  CL 115*  < > 114* 113* 107 110 107 110 109 112 114*   CO2 21  < > 19 17* 18* 21 23 24  33* 34* 33*  GLUCOSE 150*  < > 234* 243* 375* 306* 334* 180* 136* 193* 171*  BUN 32*  < > 57* 91* 105* 105* 120* 117* 100* 86* 79*  CREATININE 1.52*  < > 3.11* 4.54* 4.70* 4.72* 4.47* 3.54* 2.41* 1.85* 1.63*  CALCIUM 8.2*  < > 8.3* 8.7 8.3* 8.3* 8.3* 7.9* 8.6 8.6 8.4  MG 3.1*  --  3.1* 3.0*  --   --   --  2.6*  --  2.1  --   PHOS 3.3  --  5.0* 5.8* 7.0* 6.1*  --   --   --  2.9  --   < > = values in this interval not displayed.  Liver Function Tests:  Recent Labs Lab 12/09/14 1737 12/10/14 0315 12/13/14 0400  AST  --  34 120*  ALT  --  39* 157*  ALKPHOS  --  72 65  BILITOT  --  0.2* 0.2*  PROT  --  6.4 5.5*  ALBUMIN 2.4* 2.4*  2.5* 2.1*   No results for input(s): LIPASE, AMYLASE in the last 168 hours.  Recent Labs Lab 12/06/14 1500 12/07/14 0525 12/08/14 1030  AMMONIA 22 33* 16    CBC:  Recent Labs Lab 12/09/14 0458 12/10/14 0315 12/11/14 0455 12/12/14 0445 12/13/14 0400  WBC 16.6* 15.7* 13.6* 8.7 8.8  HGB 8.0* 8.2* 8.5* 8.7* 7.6*  HCT 24.0* 25.0* 25.6* 26.9* 24.0*  MCV 92.7 91.6 91.8  93.4 96.4  PLT 209 215 237 236 183    Cardiac Enzymes:  Recent Labs Lab 12/08/14 0515 12/09/14 0458  CKTOTAL 167 104    Lipid Panel: No results for input(s): CHOL, TRIG, HDL, CHOLHDL, VLDL, LDLCALC in the last 168 hours.  CBG:  Recent Labs Lab 12/12/14 1603 12/12/14 1955 12/12/14 2324 12/13/14 0353 12/13/14 0820  GLUCAP 174* 164* 196* 148* 146*    Microbiology: Results for orders placed or performed during the hospital encounter of 12/04/14  Blood Culture (routine x 2)     Status: None   Collection Time: 12/04/14  8:10 PM  Result Value Ref Range Status   Specimen Description BLOOD RIGHT WRIST  Final   Special Requests BOTTLES DRAWN AEROBIC AND ANAEROBIC 5CC EACH  Final   Culture   Final    NO GROWTH 5 DAYS Performed at [facility]    Report Status 12/11/2014 FINAL  Final  Blood Culture (routine x 2)      Status: None   Collection Time: 12/04/14  8:11 PM  Result Value Ref Range Status   Specimen Description BLOOD LEFT ARM  Final   Special Requests BOTTLES DRAWN AEROBIC AND ANAEROBIC 5CC EACH  Final   Culture   Final    NO GROWTH 5 DAYS Performed at [facility]    Report Status 12/11/2014 FINAL  Final  Urine culture     Status: None   Collection Time: 12/04/14  9:31 PM  Result Value Ref Range Status   Specimen Description URINE, CLEAN CATCH  Final   Special Requests NONE  Final   Colony Count   Final    >=100,000 COLONIES/ML Performed at [facility]    Culture   Final    Multiple bacterial morphotypes present, none predominant. Suggest appropriate recollection if clinically indicated. Performed at [facility]    Report Status 12/05/2014 FINAL  Final  MRSA PCR Screening     Status: None   Collection Time: 12/05/14  4:23 AM  Result Value Ref Range Status   MRSA by PCR NEGATIVE NEGATIVE Final    Comment:        The GeneXpert MRSA Assay (FDA approved for NASAL specimens only), is one component of a comprehensive MRSA colonization surveillance program. It is not intended to diagnose MRSA infection nor to guide or monitor treatment for MRSA infections.   CSF culture     Status: None (Preliminary result)   Collection Time: 12/06/14 11:25 AM  Result Value Ref Range Status   Specimen Description CSF  Final   Special Requests Normal  Final   Gram Stain   Final    NO WBC SEEN NO ORGANISMS SEEN CYTOSPIN SLIDE Performed at [facility]    Culture NO GROWTH Performed at [facility]   Final   Report Status PENDING  Incomplete  Clostridium Difficile by PCR     Status: None   Collection Time: 12/07/14  4:28 PM  Result Value Ref Range Status   C difficile by pcr NEGATIVE NEGATIVE Final    Coagulation Studies: No results for input(s): LABPROT, INR in the last 72 hours.  Imaging: Mr Sherrin Daisy Contrast  12/11/2014   CLINICAL DATA:   Patient was found unresponsive at home. History of diabetes, hypertension, and polysubstance abuse. Subsequent encounter.  EXAM: MRI HEAD WITHOUT CONTRAST  TECHNIQUE: Multiplanar, multiecho pulse sequences of the brain and surrounding structures were obtained without intravenous contrast.  COMPARISON:  12/05/2014 MR head.  CT head 12/04/2014.  FINDINGS: Redemonstrated are BILATERAL symmetric areas of restricted diffusion within the body and tail of the hippocampus extending into posterior cingulate gyrus. These are accompanied by T2 and FLAIR hyperintensity. No other areas of restricted diffusion are observed.  Motion degraded axial images through the remainder of the brain demonstrates premature atrophy with chronic microvascular ischemic change. No definite hemorrhage. No mass lesion or hydrocephalus and no extra-axial fluid.  IMPRESSION: Stable abnormal brain MR with restricted diffusion and T2/FLAIR hyperintensity within the limbic system affecting the hippocampus primarily. Given the cerebrospinal fluid findings with elevated white count, but negative HSV, favor non-HSV viral encephalitis. The imaging pattern would be unusual for bacterial cerebritis despite CSF neutrophil predominance. No definite meningeal thickening on motion degraded FLAIR imaging.   Electronically Signed   By: Davonna Belling M.D.   On: 12/11/2014 15:00   Dg Chest Port 1 View  12/13/2014   CLINICAL DATA:  Acute respiratory failure  EXAM: PORTABLE CHEST - 1 VIEW  COMPARISON:  12/12/2014  FINDINGS: Endotracheal tube in good position. Left jugular catheter tip in the SVC at the cavoatrial junction unchanged. NG tube enters the stomach with the tip not visualized.  Bibasilar airspace disease with mild progression. Negative for edema or effusion.  IMPRESSION: Support lines remain in good position.  Progression of bibasilar atelectasis/ infiltrate right greater than left.   Electronically Signed   By: Marlan Palau M.D.   On: 12/13/2014 07:22    Dg Chest Port 1 View  12/12/2014   CLINICAL DATA:  Respiratory failure.  EXAM: PORTABLE CHEST - 1 VIEW  COMPARISON:  12/11/2014.  FINDINGS: Endotracheal tube, left IJ line, NG tube in stable position. Subsegmental atelectasis in the lung bases. No pleural effusion or pneumothorax. Heart size is stable. No acute bony abnormality.  IMPRESSION: 1. Lines and tubes in stable position. 2. Mild basilar subsegmental atelectasis.   Electronically Signed   By: Maisie Fus  Register   On: 12/12/2014 07:16    Medications:  Scheduled: . amLODipine  10 mg Per Tube Daily  . antiseptic oral rinse  7 mL Mouth Rinse QID  . cefTRIAXone (ROCEPHIN)  IV  2 g Intravenous Q12H  . chlorhexidine  15 mL Mouth Rinse BID  . feeding supplement (PRO-STAT SUGAR FREE 64)  30 mL Oral BID  . feeding supplement (VITAL HIGH PROTEIN)  1,000 mL Per Tube Q24H  . folic acid  1 mg Per Tube Daily  . free water  250 mL Per Tube Q6H  . insulin aspart  0-20 Units Subcutaneous 6 times per day  . insulin glargine  8 Units Subcutaneous BID  . lactulose  20 g Per Tube Daily  . metoprolol tartrate  50 mg Per Tube BID  . pantoprazole sodium  40 mg Per Tube Q24H  . thiamine  100 mg Per Tube Daily  . vancomycin  1,000 mg Intravenous Once    Assessment/Plan: 59 y.o. female admitted to the hospital after being found unresponsive at home. Initial work up revealed that CG was 54, elevated ammonia , and CT brain without acute abnormality. MRI brain 3/22 revealed signal abnormality on diffusion images involving the body and tail of the hippocampus extending into the posterior cingulate gyrus. No significant abnormal enhancement. Overall, such findings could represent post ictal changes, hypoglycemic encephalopathy, limbic encephalitis. EEG without electrographic seizures but a pattern most consistent with encephalopathy.  Unclear etiology of continued encephalopathy. Appreciate ID input, will continue rocephin and vancomycin. CSF NMDA and AMPA  studies  pending.    Will continue to follow.    Elspeth Cho, DO [facility] [phone]  If 7pm- 7am, please page neurology on call as listed in AMION.

## 2014-12-13 NOTE — Progress Notes (Signed)
PULMONARY / CRITICAL CARE MEDICINE   Name: Gina Cox MRN: [id number] DOB: 13-May-1956    ADMISSION DATE:  12/04/2014  REFERRING MD :  Madilyn Hook EDP  CHIEF COMPLAINT:  Found down  INITIAL PRESENTATION: 59 y/o female found unresponsive by family at home.  CBG 54 in ER, and intubated for airway protection >> Difficult airway.  STUDIES:  3/21 CT head >> no acute intracranial process 3/22 MRI Brain >> hippocampal diffusion signal abnormal to the posterior singulate gyrus  3/22 EEG >> no seizure activity 3/22 LP >> glucose 96, protein 42, WBC 128 (82%N), RBC 13 3/25 EEG >> toxic/metabolic encephalopathy.  No seizure activity 3/28 MRI Brain >> stable, abnormal brain MR with hyperintensity in limbic system affecting the hippocampus  SIGNIFICANT EVENTS: 3/21 Admit 3/22 Neurology consulted 3/24 ID consulted 3/26 acyclovir, ampicillin, decadron d/c'ed  SUBJECTIVE:   RN reports pt weaning on PSV since early am,no distress.  Mental status unchanged.   VITAL SIGNS: Temp:  [97.4 F (36.3 C)-99.4 F (37.4 C)] 99.4 F (37.4 C) (03/30 1159) Pulse Rate:  [75-93] 87 (03/30 1210) Resp:  [14-15] 14 (03/30 0327) BP: (103-158)/(30-55) 154/55 mmHg (03/30 1210) SpO2:  [97 %-100 %] 99 % (03/30 1210) FiO2 (%):  [30 %] 30 % (03/30 1200) Weight:  [238 lb 8.6 oz (108.2 kg)] 238 lb 8.6 oz (108.2 kg) (03/30 0452)   VENTILATOR SETTINGS: Vent Mode:  [-] PRVC FiO2 (%):  [30 %] 30 % Set Rate:  [14 bmp] 14 bmp Vt Set:  [440 mL] 440 mL PEEP:  [5 cmH20] 5 cmH20 Plateau Pressure:  [12 cmH20-16 cmH20] 15 cmH20   INTAKE / OUTPUT:  Intake/Output Summary (Last 24 hours) at 12/13/14 1221 Last data filed at 12/13/14 1200  Gross per 24 hour  Intake   2065 ml  Output   2625 ml  Net   -560 ml   PHYSICAL EXAMINATION: General: no distress Neuro: opens eyes with stimulation, no following commands HEENT: ETT in place, mm pink moist Cardiovascular: regular Lungs: even/non-labored, scattered rhonchi Abdomen:  soft, non tender Musculoskeletal: 2+ edema Skin: no rashes  LABS:  CBC  Recent Labs Lab 12/11/14 0455 12/12/14 0445 12/13/14 0400  WBC 13.6* 8.7 8.8  HGB 8.5* 8.7* 7.6*  HCT 25.6* 26.9* 24.0*  PLT 237 236 183   Coag's No results for input(s): APTT, INR in the last 168 hours.   BMET  Recent Labs Lab 12/12/14 0445 12/12/14 1844 12/13/14 0400  NA 153* 153* 155*  K 2.7* 4.6 4.0  CL 109 112 114*  CO2 33* 34* 33*  BUN 100* 86* 79*  CREATININE 2.41* 1.85* 1.63*  GLUCOSE 136* 193* 171*   Electrolytes  Recent Labs Lab 12/09/14 0458 12/09/14 1737 12/10/14 0315  12/11/14 0455 12/12/14 0445 12/12/14 1844 12/13/14 0400  CALCIUM 8.7 8.3* 8.3*  < > 7.9* 8.6 8.6 8.4  MG 3.0*  --   --   --  2.6*  --  2.1  --   PHOS 5.8* 7.0* 6.1*  --   --   --  2.9  --   < > = values in this interval not displayed.   Sepsis Markers No results for input(s): LATICACIDVEN, PROCALCITON, O2SATVEN in the last 168 hours.   ABG  Recent Labs Lab 12/10/14 0400 12/11/14 0348 12/12/14 1728  PHART 7.433 7.436 7.467*  PCO2ART 29.4* 37.7 46.4*  PO2ART 135.0* 122.0* 113.0*   Liver Enzymes  Recent Labs Lab 12/09/14 1737 12/10/14 0315 12/13/14 0400  AST  --  34 120*  ALT  --  39* 157*  ALKPHOS  --  72 65  BILITOT  --  0.2* 0.2*  ALBUMIN 2.4* 2.4*  2.5* 2.1*   Cardiac Enzymes No results for input(s): TROPONINI, PROBNP in the last 168 hours.   Glucose  Recent Labs Lab 12/12/14 1156 12/12/14 1603 12/12/14 1955 12/12/14 2324 12/13/14 0353 12/13/14 0820  GLUCAP 136* 174* 164* 196* 148* 146*   Imaging Dg Chest Port 1 View  12/12/2014   CLINICAL DATA:  Respiratory failure.  EXAM: PORTABLE CHEST - 1 VIEW  COMPARISON:  12/11/2014.  FINDINGS: Endotracheal tube, left IJ line, NG tube in stable position. Subsegmental atelectasis in the lung bases. No pleural effusion or pneumothorax. Heart size is stable. No acute bony abnormality.  IMPRESSION: 1. Lines and tubes in stable position.  2. Mild basilar subsegmental atelectasis.   Electronically Signed   By: Maisie Fus  Register   On: 12/12/2014 07:16   ASSESSMENT / PLAN:  NEUROLOGIC A:   Acute encephalopathy >> concern for HSV encephalitis, meningitis, metabolic (hypoglycemia on admission, elevated ammonia, AKI). Hx of ETOH, cocaine abuse. R/O DI - increased UOP 3/29, resolved 3/30.   P:   Monitor mental status RASS goal 0 F/u anti-NMDAR, anti-AMPA  Continue lactulose Thiamine, folic acid  PULMONARY OETT 1/61 >>> A:  Acute respiratory failure secondary to inability to protect airway >> difficult airway. Tobacco abuse. P:   Pressure support wean as tolerated >> mental status prevents extubation trial F/u CXR  CARDIOVASCULAR A: Hx HTN, HLD. P:  Continue norvasc Metoprolol to 50 mg bid on 3/28 Hold outpt lisinopril, simvastatin, nebivolol  RENAL A:  AKI >> creatinine might be reaching plateau.  FENa 0.67 %, suggestive of pre-renal failure Gap/non gap metabolic acidosis >> improved 0/96. Hypokalemia. P:   Renal dose adjust meds NS at 50 ml/hr Replace electrolytes as needed.   F/u BMET If no improvement in renal fx and urine outpt decrease, then will need nephrology evaluation >> defer for now  GASTROINTESTINAL A:  Nutrition. Diarrhea >> C diff negative 3/24. P:   TF on vent Protonix  HEMATOLOGIC A:  Anemia of critical illness. P:  F/u CBC SQ heparin for DVT prevention  INFECTIOUS A:   Possible meningitis. P:   Day 10 rocephin, vancomycin per ID  Blood 3/21 >> neg  ENDOCRINE A:  DM with renal complications. P:   SSI Change lantus to 8 units bid on 3/29 Hold outpt pioglitazone, metformin    Canary Brim, NP-C [facility] Pgr: [phone] or [phone]    12/13/2014, 12:21 PM

## 2014-12-14 ENCOUNTER — Inpatient Hospital Stay (HOSPITAL_COMMUNITY): Payer: Medicare Other

## 2014-12-14 LAB — GLUCOSE, CAPILLARY
GLUCOSE-CAPILLARY: 194 mg/dL — AB (ref 70–99)
GLUCOSE-CAPILLARY: 165 mg/dL — AB (ref 70–99)
GLUCOSE-CAPILLARY: 196 mg/dL — AB (ref 70–99)
Glucose-Capillary: 165 mg/dL — ABNORMAL HIGH (ref 70–99)
Glucose-Capillary: 163 mg/dL — ABNORMAL HIGH (ref 70–99)
Glucose-Capillary: 211 mg/dL — ABNORMAL HIGH (ref 70–99)

## 2014-12-14 LAB — BASIC METABOLIC PANEL
Anion gap: 5 (ref 5–15)
BUN: 66 mg/dL — AB (ref 6–23)
CALCIUM: 8.7 mg/dL (ref 8.4–10.5)
CO2: 34 mmol/L — ABNORMAL HIGH (ref 19–32)
CREATININE: 1.48 mg/dL — AB (ref 0.50–1.10)
Chloride: 116 mmol/L — ABNORMAL HIGH (ref 96–112)
GFR, EST AFRICAN AMERICAN: 43 mL/min — AB (ref >90)
GFR, EST NON AFRICAN AMERICAN: 37 mL/min — AB (ref >90)
Glucose, Bld: 221 mg/dL — ABNORMAL HIGH (ref 70–99)
Potassium: 4 mmol/L (ref 3.5–5.1)
Sodium: 155 mmol/L — ABNORMAL HIGH (ref 135–145)

## 2014-12-14 LAB — CBC
HCT: 24.8 % — ABNORMAL LOW (ref 36.0–46.0)
HEMOGLOBIN: 7.6 g/dL — AB (ref 12.0–15.0)
MCH: 30.2 pg (ref 26.0–34.0)
MCHC: 30.6 g/dL (ref 30.0–36.0)
MCV: 98.4 fL (ref 78.0–100.0)
PLATELETS: 186 10*3/uL (ref 150–400)
RBC: 2.52 MIL/uL — ABNORMAL LOW (ref 3.87–5.11)
RDW: 15 % (ref 11.5–15.5)
WBC: 12.1 10*3/uL — AB (ref 4.0–10.5)

## 2014-12-14 LAB — VANCOMYCIN, TROUGH: Vancomycin Tr: 19.6 ug/mL (ref 10.0–20.0)

## 2014-12-14 MED ORDER — VANCOMYCIN HCL IN DEXTROSE 750-5 MG/150ML-% IV SOLN
750.0000 mg | Freq: Once | INTRAVENOUS | Status: AC
  Administered 2014-12-14: 750 mg via INTRAVENOUS
  Filled 2014-12-14: qty 150

## 2014-12-14 MED ORDER — FREE WATER
250.0000 mL | Status: DC
  Administered 2014-12-14 – 2014-12-22 (×40): 250 mL

## 2014-12-14 MED ORDER — DEXTROSE 5 % IV SOLN
INTRAVENOUS | Status: DC
  Administered 2014-12-14 – 2014-12-22 (×6): via INTRAVENOUS

## 2014-12-14 NOTE — Progress Notes (Signed)
INFECTIOUS DISEASE PROGRESS NOTE  ID: Gina Cox is a 59 y.o. female with  Active Problems:   Acute encephalopathy   Altered mental status   Acute respiratory failure with hypoxia   Encephalitis   Acute respiratory failure   Bacterial meningitis   Respiratory failure  Subjective: Awakens to voice, does not follow commands  Abtx:  Anti-infectives    Start     Dose/Rate Route Frequency Ordered Stop   12/14/14 1000  vancomycin (VANCOCIN) IVPB 750 mg/150 ml premix     750 mg 150 mL/hr over 60 Minutes Intravenous  Once 12/14/14 0927     12/13/14 0945  vancomycin (VANCOCIN) IVPB 1000 mg/200 mL premix     1,000 mg 200 mL/hr over 60 Minutes Intravenous  Once 12/13/14 0944 12/13/14 1124   12/13/14 0900  vancomycin (VANCOCIN) IVPB 1000 mg/200 mL premix  Status:  Discontinued     1,000 mg 200 mL/hr over 60 Minutes Intravenous  Once 12/13/14 0835 12/13/14 0941   12/11/14 0700  vancomycin (VANCOCIN) 1,500 mg in sodium chloride 0.9 % 500 mL IVPB  Status:  Discontinued     1,500 mg 250 mL/hr over 120 Minutes Intravenous Every 48 hours 12/11/14 0609 12/12/14 1242   12/08/14 1800  acyclovir (ZOVIRAX) 550 mg in dextrose 5 % 100 mL IVPB  Status:  Discontinued     550 mg 111 mL/hr over 60 Minutes Intravenous Every 24 hours 12/07/14 2243 12/09/14 1053   12/08/14 0600  ampicillin (OMNIPEN) 2 g in sodium chloride 0.9 % 50 mL IVPB  Status:  Discontinued     2 g 150 mL/hr over 20 Minutes Intravenous 3 times per day 12/07/14 2243 12/09/14 1053   12/07/14 1800  acyclovir (ZOVIRAX) 550 mg in dextrose 5 % 100 mL IVPB  Status:  Discontinued     550 mg 111 mL/hr over 60 Minutes Intravenous Every 12 hours 12/07/14 1253 12/07/14 2243   12/07/14 1400  ampicillin (OMNIPEN) 2 g in sodium chloride 0.9 % 50 mL IVPB  Status:  Discontinued     2 g 150 mL/hr over 20 Minutes Intravenous Every 6 hours 12/07/14 1253 12/07/14 2243   12/07/14 1000  vancomycin (VANCOCIN) 500 mg in sodium chloride 0.9 % 100 mL  IVPB  Status:  Discontinued     500 mg 100 mL/hr over 60 Minutes Intravenous Every 12 hours 12/06/14 2227 12/07/14 1255   12/05/14 2000  cefTRIAXone (ROCEPHIN) 2 g in dextrose 5 % 50 mL IVPB  Status:  Discontinued     2 g 100 mL/hr over 30 Minutes Intravenous Every 24 hours 12/04/14 2022 12/05/14 0051   12/05/14 1000  vancomycin (VANCOCIN) IVPB 750 mg/150 ml premix     750 mg 150 mL/hr over 60 Minutes Intravenous Every 12 hours 12/05/14 0105 12/06/14 2329   12/05/14 0800  cefTRIAXone (ROCEPHIN) 2 g in dextrose 5 % 50 mL IVPB - Premix     2 g 100 mL/hr over 30 Minutes Intravenous Every 12 hours 12/05/14 0105     12/05/14 0115  ampicillin (OMNIPEN) 2 g in sodium chloride 0.9 % 50 mL IVPB  Status:  Discontinued     2 g 150 mL/hr over 20 Minutes Intravenous 6 times per day 12/05/14 0105 12/07/14 1253   12/05/14 0115  acyclovir (ZOVIRAX) 550 mg in dextrose 5 % 100 mL IVPB  Status:  Discontinued     550 mg 111 mL/hr over 60 Minutes Intravenous 3 times per day 12/05/14 0107 12/07/14 1253  12/04/14 2300  vancomycin (VANCOCIN) 2,000 mg in sodium chloride 0.9 % 500 mL IVPB  Status:  Discontinued     2,000 mg 250 mL/hr over 120 Minutes Intravenous  Once 12/04/14 2205 12/05/14 0051   12/04/14 2015  cefTRIAXone (ROCEPHIN) 2 g in dextrose 5 % 50 mL IVPB     2 g 100 mL/hr over 30 Minutes Intravenous  Once 12/04/14 2009 12/04/14 2100      Medications:  Scheduled: . amLODipine  10 mg Per Tube Daily  . antiseptic oral rinse  7 mL Mouth Rinse QID  . cefTRIAXone (ROCEPHIN)  IV  2 g Intravenous Q12H  . chlorhexidine  15 mL Mouth Rinse BID  . feeding supplement (PRO-STAT SUGAR FREE 64)  30 mL Oral BID  . feeding supplement (VITAL HIGH PROTEIN)  1,000 mL Per Tube Q24H  . folic acid  1 mg Per Tube Daily  . free water  250 mL Per Tube Q4H  . insulin aspart  0-20 Units Subcutaneous 6 times per day  . insulin glargine  8 Units Subcutaneous BID  . lactulose  20 g Per Tube Daily  . metoprolol tartrate   50 mg Per Tube BID  . pantoprazole sodium  40 mg Per Tube Q24H  . thiamine  100 mg Per Tube Daily  . vancomycin  750 mg Intravenous Once    Objective: Vital signs in last 24 hours: Temp:  [98.6 F (37 C)-100.4 F (38 C)] 100.4 F (38 C) (03/31 0800) Pulse Rate:  [77-95] 82 (03/31 0800) Resp:  [14-17] 14 (03/31 0800) BP: (119-174)/(41-70) 130/43 mmHg (03/31 0800) SpO2:  [97 %-100 %] 100 % (03/31 0800) FiO2 (%):  [30 %] 30 % (03/31 0816) Weight:  [103.9 kg (229 lb 0.9 oz)] 103.9 kg (229 lb 0.9 oz) (03/31 0400)   General appearance: no distress and Awakens to voice, does not follow commands Resp: clear to auscultation bilaterally Cardio: regular rate and rhythm GI: normal findings: bowel sounds normal and soft, non-tender  Lab Results  Recent Labs  12/13/14 0400 12/14/14 0500  WBC 8.8 12.1*  HGB 7.6* 7.6*  HCT 24.0* 24.8*  NA 155* 155*  K 4.0 4.0  CL 114* 116*  CO2 33* 34*  BUN 79* 66*  CREATININE 1.63* 1.48*   Liver Panel  Recent Labs  12/13/14 0400  PROT 5.5*  ALBUMIN 2.1*  AST 120*  ALT 157*  ALKPHOS 65  BILITOT 0.2*   Sedimentation Rate No results for input(s): ESRSEDRATE in the last 72 hours. C-Reactive Protein No results for input(s): CRP in the last 72 hours.  Microbiology: Recent Results (from the past 240 hour(s))  Blood Culture (routine x 2)     Status: None   Collection Time: 12/04/14  8:10 PM  Result Value Ref Range Status   Specimen Description BLOOD RIGHT WRIST  Final   Special Requests BOTTLES DRAWN AEROBIC AND ANAEROBIC 5CC EACH  Final   Culture   Final    NO GROWTH 5 DAYS Performed at [facility]    Report Status 12/11/2014 FINAL  Final  Blood Culture (routine x 2)     Status: None   Collection Time: 12/04/14  8:11 PM  Result Value Ref Range Status   Specimen Description BLOOD LEFT ARM  Final   Special Requests BOTTLES DRAWN AEROBIC AND ANAEROBIC 5CC EACH  Final   Culture   Final    NO GROWTH 5 DAYS Performed at  [facility]    Report Status  12/11/2014 FINAL  Final  Urine culture     Status: None   Collection Time: 12/04/14  9:31 PM  Result Value Ref Range Status   Specimen Description URINE, CLEAN CATCH  Final   Special Requests NONE  Final   Colony Count   Final    >=100,000 COLONIES/ML Performed at [facility]    Culture   Final    Multiple bacterial morphotypes present, none predominant. Suggest appropriate recollection if clinically indicated. Performed at [facility]    Report Status 12/05/2014 FINAL  Final  MRSA PCR Screening     Status: None   Collection Time: 12/05/14  4:23 AM  Result Value Ref Range Status   MRSA by PCR NEGATIVE NEGATIVE Final    Comment:        The GeneXpert MRSA Assay (FDA approved for NASAL specimens only), is one component of a comprehensive MRSA colonization surveillance program. It is not intended to diagnose MRSA infection nor to guide or monitor treatment for MRSA infections.   CSF culture     Status: None (Preliminary result)   Collection Time: 12/06/14 11:25 AM  Result Value Ref Range Status   Specimen Description CSF  Final   Special Requests Normal  Final   Gram Stain   Final    NO WBC SEEN NO ORGANISMS SEEN CYTOSPIN SLIDE Performed at [facility]    Culture NO GROWTH Performed at [facility]   Final   Report Status PENDING  Incomplete  Clostridium Difficile by PCR     Status: None   Collection Time: 12/07/14  4:28 PM  Result Value Ref Range Status   C difficile by pcr NEGATIVE NEGATIVE Final    Studies/Results: Dg Chest Port 1 View  12/14/2014   CLINICAL DATA:  Respiratory failure.  EXAM: PORTABLE CHEST - 1 VIEW  COMPARISON:  12/13/2014.  12/12/2014.  FINDINGS: Endotracheal tube, left IJ line, NG tube in stable position. Low lung volumes with stable bibasilar atelectasis and or infiltrates. Heart size stable. Normal pulmonary vascularity. No pleural effusion or pneumothorax. No acute  osseus abnormality.  IMPRESSION: 1. Lines and tubes in stable position. 2. Persistent bibasilar atelectasis and/or infiltrates.   Electronically Signed   By: Maisie Fus  Register   On: 12/14/2014 07:38   Dg Chest Port 1 View  12/13/2014   CLINICAL DATA:  Acute respiratory failure  EXAM: PORTABLE CHEST - 1 VIEW  COMPARISON:  12/12/2014  FINDINGS: Endotracheal tube in good position. Left jugular catheter tip in the SVC at the cavoatrial junction unchanged. NG tube enters the stomach with the tip not visualized.  Bibasilar airspace disease with mild progression. Negative for edema or effusion.  IMPRESSION: Support lines remain in good position.  Progression of bibasilar atelectasis/ infiltrate right greater than left.   Electronically Signed   By: Marlan Palau M.D.   On: 12/13/2014 07:22     Assessment/Plan: Meningitis (unknown cause) ARF DM Protein-albumin malnutrition  Total days of antibiotics: 11 (vanco/ceftriaxone). Completed steroids  Slow improvement Low grade temps, slight increase in WBC Would aim for 3 more days of anbx Suspect this episode is related to her DM Unfortunately we did not have reliable CSF specimen to use for Cx.  Available if questions.          Johny Sax Infectious Diseases (pager) [phone] [email or web address] 12/14/2014, 9:43 AM  LOS: 10 days

## 2014-12-14 NOTE — Progress Notes (Signed)
NUTRITION FOLLOW UP  Intervention:   Continue Vital High Protein via OGT goal rate of 40 ml/hr.   30 ml Pro-stat BID  Current TF regimen provides 1250 kcal (13 g/kg actual body weight), 114 g protein and 803 ml free water  Nutrition Dx:   Inadequate oral intake related to inability to eat as evidenced by NPO status; ongoing.  Goal:   Enteral nutrition to provide 60-70% of estimated caloric needs (22-25 g/kg of ideal body weight) and 100% of estimated protein needs per ASPEN guideline of hypo caloric high protein feeding of critically ill obese individuals; met with TF  Monitor:   TF initiation/tolerance/adequacy, weight trends, labs, I/Os  Assessment:   Pt found unresponsive, brought to ED and required intubation. PMH significant for HTN, DM, polysubstance abuse.   Per MD note, pt needs trach and LTACH placement  Patient is currently intubated on ventilator support MV: 6.1 L/min Temp (24hrs), Avg:99.3 F (37.4 C), Min:98.5 F (36.9 C), Max:100.4 F (38 C)  Propofol: none  No signs of fat or muscle wasting  Pt tolerating TF at goal rate  Pt in + fluid balance, admission weight 215 lbs current weight 229 lbs  Labs reviewed  CBGs 163-194 Na elevated 155 BUN elevated 66  Height: Ht Readings from Last 1 Encounters:  12/11/14 _0  (1.626 m)    Weight Status:   Wt Readings from Last 1 Encounters:  12/14/14 229 lb 0.9 oz (103.9 kg)    Re-estimated needs:  Kcal: 2263-3354 (11-14 kcal/kg actual body weight) Protein: >/= 110 g protein Fluid: >/= 1.6 L * pt in + fluid balance, will use admission weight of 215 lbs  Skin: intact  Diet Order:     Intake/Output Summary (Last 24 hours) at 12/14/14 1438 Last data filed at 12/14/14 1055  Gross per 24 hour  Intake   2420 ml  Output   3675 ml  Net  -1255 ml    Last BM: 3/31- 300 mL output via rectal tube   Labs:   Recent Labs Lab 12/09/14 0458 12/09/14 1737 12/10/14 0315  12/11/14 0455  12/12/14 1844  12/13/14 0400 12/14/14 0500  NA 147* 140 145  < > 148*  < > 153* 155* 155*  K 3.2* 4.6 3.0*  < > 2.8*  < > 4.6 4.0 4.0  CL 113* 107 110  < > 110  < > 112 114* 116*  CO2 17* 18* 21  < > 24  < > 34* 33* 34*  BUN 91* 105* 105*  < > 117*  < > 86* 79* 66*  CREATININE 4.54* 4.70* 4.72*  < > 3.54*  < > 1.85* 1.63* 1.48*  CALCIUM 8.7 8.3* 8.3*  < > 7.9*  < > 8.6 8.4 8.7  MG 3.0*  --   --   --  2.6*  --  2.1  --   --   PHOS 5.8* 7.0* 6.1*  --   --   --  2.9  --   --   GLUCOSE 243* 375* 306*  < > 180*  < > 193* 171* 221*  < > = values in this interval not displayed.  CBG (last 3)   Recent Labs  12/14/14 0355 12/14/14 0735 12/14/14 1116  GLUCAP 165* 163* 165*    Scheduled Meds: . amLODipine  10 mg Per Tube Daily  . antiseptic oral rinse  7 mL Mouth Rinse QID  . cefTRIAXone (ROCEPHIN)  IV  2 g Intravenous Q12H  . chlorhexidine  15 mL Mouth Rinse BID  . feeding supplement (PRO-STAT SUGAR FREE 64)  30 mL Oral BID  . feeding supplement (VITAL HIGH PROTEIN)  1,000 mL Per Tube Q24H  . folic acid  1 mg Per Tube Daily  . free water  250 mL Per Tube Q4H  . insulin aspart  0-20 Units Subcutaneous 6 times per day  . insulin glargine  8 Units Subcutaneous BID  . lactulose  20 g Per Tube Daily  . metoprolol tartrate  50 mg Per Tube BID  . pantoprazole sodium  40 mg Per Tube Q24H  . thiamine  100 mg Per Tube Daily    Continuous Infusions: . dextrose 50 mL/hr at 12/14/14 [address] [place], [place], [place]

## 2014-12-14 NOTE — Progress Notes (Signed)
ANTIBIOTIC CONSULT NOTE - FOLLOW UP  Pharmacy Consult for rocephin, vancomycin Indication: suspected meningitis  No Known Allergies  Patient Measurements: Height:  (162.6 cm) Weight: 229 lb 0.9 oz (103.9 kg) IBW/kg (Calculated) : 54.7  Vital Signs: Temp: 100.4 F (38 C) (03/31 0800) Temp Source: Oral (03/31 0800) BP: 170/59 mmHg (03/31 1044) Pulse Rate: 100 (03/31 1044) Intake/Output from previous day: 03/30 0701 - 03/31 0700 In: 3305 [I.V.:975; NG/GT:2030; IV Piggyback:300] Out: 4700 [Urine:4400; Stool:300] Intake/Output from this shift: Total I/O In: 320 [I.V.:150; NG/GT:120; IV Piggyback:50] Out: 325 [Urine:325]  Labs:  Recent Labs  12/12/14 0445 12/12/14 1844 12/13/14 0400 12/14/14 0500  WBC 8.7  --  8.8 12.1*  HGB 8.7*  --  7.6* 7.6*  PLT 236  --  183 186  CREATININE 2.41* 1.85* 1.63* 1.48*   Estimated Creatinine Clearance: 47.5 mL/min (by C-G formula based on Cr of 1.48).  Recent Labs  12/12/14 1100 12/13/14 0400 12/14/14 0816  VANCOTROUGH  --   --  19.6  VANCORANDOM 27.6 18.7  --      Microbiology: Recent Results (from the past 720 hour(s))  Blood Culture (routine x 2)     Status: None   Collection Time: 12/04/14  8:10 PM  Result Value Ref Range Status   Specimen Description BLOOD RIGHT WRIST  Final   Special Requests BOTTLES DRAWN AEROBIC AND ANAEROBIC 5CC EACH  Final   Culture   Final    NO GROWTH 5 DAYS Performed at [facility]    Report Status 12/11/2014 FINAL  Final  Blood Culture (routine x 2)     Status: None   Collection Time: 12/04/14  8:11 PM  Result Value Ref Range Status   Specimen Description BLOOD LEFT ARM  Final   Special Requests BOTTLES DRAWN AEROBIC AND ANAEROBIC 5CC EACH  Final   Culture   Final    NO GROWTH 5 DAYS Performed at [facility]    Report Status 12/11/2014 FINAL  Final  Urine culture     Status: None   Collection Time: 12/04/14  9:31 PM  Result Value Ref Range Status   Specimen  Description URINE, CLEAN CATCH  Final   Special Requests NONE  Final   Colony Count   Final    >=100,000 COLONIES/ML Performed at [facility]    Culture   Final    Multiple bacterial morphotypes present, none predominant. Suggest appropriate recollection if clinically indicated. Performed at [facility]    Report Status 12/05/2014 FINAL  Final  MRSA PCR Screening     Status: None   Collection Time: 12/05/14  4:23 AM  Result Value Ref Range Status   MRSA by PCR NEGATIVE NEGATIVE Final    Comment:        The GeneXpert MRSA Assay (FDA approved for NASAL specimens only), is one component of a comprehensive MRSA colonization surveillance program. It is not intended to diagnose MRSA infection nor to guide or monitor treatment for MRSA infections.   CSF culture     Status: None (Preliminary result)   Collection Time: 12/06/14 11:25 AM  Result Value Ref Range Status   Specimen Description CSF  Final   Special Requests Normal  Final   Gram Stain   Final    NO WBC SEEN NO ORGANISMS SEEN CYTOSPIN SLIDE Performed at [facility]    Culture NO GROWTH Performed at [facility]   Final   Report Status PENDING  Incomplete  Clostridium Difficile by PCR     Status: None   Collection Time: 12/07/14  4:28 PM  Result Value Ref Range Status   C difficile by pcr NEGATIVE NEGATIVE Final    Assessment: 58 y.oF with PMH of DM, HTN, HLD who presents with hypoglycemia and AMS after being found unresponsive at home.  She's currently on abx for likely bacterial meningitis.    3/21 >> Ceftriaxone >> 3/21 >> vanc >> 3/22 >> ampicillin >> 3/26 3/22 >> acyclovir for r/o HSV >> 3/26  3/24 >> decadron for meningitis>>3/26  Tmax: afebrile WBCs: down 15.7 Renal: new AKI, SCr trending down, now 2.41; CrCl 40 ml/min (CG), 27 ml/min (normalized) (could be d/t acyclovir; however, FeNa suggestive of pre-renal failure), UOP good, hydrating   3/21 blood x 2:  NGF 3/21 urine: >100k col/ml, none predominant FINAL 3/23 CSF: high gluc, nml protein, high WBC; non-cloudy 3/23 CSF culture: no growth, pending 3/22 HIV/RPR both negative 3/22 VDRL CSF: IP 3/24 cdiff (-) 3/23: HSV PCR (-) 3/29: rocky mtn spotted fvr: neg 3/29: Ehrlichia antibody panel: neg 3/29: antinuclear antibodies: neg  3/23 LP: glucose 96 (slightly high), protein 42, wbc high 128, neutrophils high 82  Drug level / dose changes info: 3/23 2100 VT = 20.9 on 750 mg IV q12, changed to 500 mg q12h 3/24 2100 VT = 37.4 after one dose of 500 q12 (HOLD) 3/25 2245 VT = 33.4 (cont to hold) 3/26 2246 Vanc level = 26.9 (cont to hold) 3/27 2200 Vanc level = 20.1 (cont to hold) 3/28 0500 Vanc level = 17 ==> Vanc 1500mg  x 1 3/29 1000 Vanc level = 27.6 3/30 0400 Vanc level = 18.7 ==> Vanc 1g x 1, ke 0.0229 3/31 0800 Vanc level = 19.6 ==> Vanc 750mg  x 1  Today, 3/31: Day #11 Vanc (dosing based on random levels) and Ceftriaxone 2g IV q12h for likely bacterial meningitis.   Goal of Therapy:  Vancomycin trough level 15-20 mcg/ml  Doses adjusted per renal function Eradication of infection  Plan:  1.  Vancomycin currently being dosed based on levels.  Give Vancomycin 750mg  IV x 1 this AM and plan to re-check level in AM and re-dose at that time.  Because of seriousness of infection, would like to continue to monitor levels closely and re-dose daily until SCr stabilizes.  2.  Continue Ceftriaxone 2g IV q12h.  No renal adjustments. 3.  Continue to monitor SCr and f/u ID recommendations.  [name], PharmD, BCPS Pager: [phone] 12/14/2014 10:47 AM

## 2014-12-14 NOTE — Care Management Note (Signed)
CARE MANAGEMENT NOTE 12/14/2014  Patient:  Gina Cox, Gina Cox   Account Number:  [id number]  Date Initiated:  12/05/2014  Documentation initiated by:  Gwinner,RHONDA  Subjective/Objective Assessment:   59 year old F brought to the [facility] ED on 12/04/2014 after being found down at home by daughter.  Last seen normal 1 day prior. Required intubation in the emergency department for airway protection     Action/Plan:   tbd   Anticipated DC Date:  12/17/2014   Anticipated DC Plan:  SKILLED NURSING FACILITY  In-house referral  Clinical Social Worker      DC Planning Services  CM consult      [facility] Choice  NA   Choice offered to / List presented to:  NA   DME arranged  NA           Status of service:  In process, will continue to follow Medicare Important Message given?   (If response is "NO", the following Medicare IM given date fields will be blank) Date Medicare IM given:   Medicare IM given by:   Date Additional Medicare IM given:   Additional Medicare IM given by:    Discharge Disposition:    Per UR Regulation:  Reviewed for med. necessity/level of care/duration of stay  If discussed at Long Length of Stay Meetings, dates discussed:   12/12/2014  12/14/2014    Comments:  December 14, 2014/Rhonda L. Earlene Plater, RN, BSN, CCM. Case Management [facility] [phone] No discharge needs present of time of review. Remains on vent, failed weaning.  December 11, 2014/Rhonda L. Earlene Plater, RN, BSN, CCM. Case Management [facility] [phone] No discharge needs present of time of review. Intaubated, off iv sedation but does not respond to stimuli.  Nerology in.  Poss bacterial menegitis. wbc elevated/ bld cultures are negative for growth/chem 12 panel abnormal, na 148, k+2.9  December 08, 2014/Rhonda L. Earlene Plater, RN, BSN, CCM. Case Management [facility] [phone] No discharge needs present of time of review. remains intubated, responsvie not sedated.  eeg pending  December 05, 2014/Rhonda L. Earlene Plater, RN, BSN, CCM. Case Management [facility] [phone] No discharge needs present of time of review.

## 2014-12-14 NOTE — Progress Notes (Signed)
PULMONARY / CRITICAL CARE MEDICINE   Name: Gina Cox MRN: [id number] DOB: Jan 04, 1956    ADMISSION DATE:  12/04/2014  REFERRING MD :  Madilyn Hook EDP  CHIEF COMPLAINT:  Found down  INITIAL PRESENTATION: 59 y/o female found unresponsive by family at home.  CBG 54 in ER, and intubated for airway protection >> Difficult airway.  STUDIES:  3/21 CT head >> no acute intracranial process 3/22 MRI Brain >> hippocampal diffusion signal abnormal to the posterior singulate gyrus  3/22 EEG >> no seizure activity 3/22 LP >> glucose 96, protein 42, WBC 128 (82%N), RBC 13 3/25 EEG >> toxic/metabolic encephalopathy.  No seizure activity 3/28 MRI Brain >> stable, abnormal brain MR with hyperintensity in limbic system affecting the hippocampus  SIGNIFICANT EVENTS: 3/21 Admit 3/22 Neurology consulted 3/24 ID consulted 3/26 acyclovir, ampicillin, decadron d/c'ed  SUBJECTIVE:  NSC.   VITAL SIGNS: Temp:  [98.6 F (37 C)-100.4 F (38 C)] 100.4 F (38 C) (03/31 0800) Pulse Rate:  [77-95] 82 (03/31 0800) Resp:  [14-17] 14 (03/31 0800) BP: (119-174)/(41-70) 130/43 mmHg (03/31 0800) SpO2:  [97 %-100 %] 100 % (03/31 0800) FiO2 (%):  [30 %] 30 % (03/31 0816) Weight:  [229 lb 0.9 oz (103.9 kg)] 229 lb 0.9 oz (103.9 kg) (03/31 0400)   VENTILATOR SETTINGS: Vent Mode:  [-] CPAP FiO2 (%):  [30 %] 30 % Set Rate:  [14 bmp] 14 bmp Vt Set:  [440 mL] 440 mL PEEP:  [5 cmH20] 5 cmH20 Pressure Support:  [15 cmH20] 15 cmH20 Plateau Pressure:  [14 cmH20-18 cmH20] 18 cmH20   INTAKE / OUTPUT:  Intake/Output Summary (Last 24 hours) at 12/14/14 0909 Last data filed at 12/14/14 0809  Gross per 24 hour  Intake   3420 ml  Output   4025 ml  Net   -605 ml   PHYSICAL EXAMINATION: General: no distress Neuro: opens eyes with stimulation, no following commands HEENT: ETT in place, mm pink moist Cardiovascular: regular Lungs: even/non-labored, scattered rhonchi Abdomen: soft, non tender Musculoskeletal: 2+  edema Skin: no rashes  LABS:  CBC  Recent Labs Lab 12/12/14 0445 12/13/14 0400 12/14/14 0500  WBC 8.7 8.8 12.1*  HGB 8.7* 7.6* 7.6*  HCT 26.9* 24.0* 24.8*  PLT 236 183 186   Coag's No results for input(s): APTT, INR in the last 168 hours.   BMET  Recent Labs Lab 12/12/14 1844 12/13/14 0400 12/14/14 0500  NA 153* 155* 155*  K 4.6 4.0 4.0  CL 112 114* 116*  CO2 34* 33* 34*  BUN 86* 79* 66*  CREATININE 1.85* 1.63* 1.48*  GLUCOSE 193* 171* 221*   Electrolytes  Recent Labs Lab 12/09/14 0458 12/09/14 1737 12/10/14 0315  12/11/14 0455  12/12/14 1844 12/13/14 0400 12/14/14 0500  CALCIUM 8.7 8.3* 8.3*  < > 7.9*  < > 8.6 8.4 8.7  MG 3.0*  --   --   --  2.6*  --  2.1  --   --   PHOS 5.8* 7.0* 6.1*  --   --   --  2.9  --   --   < > = values in this interval not displayed.   Sepsis Markers No results for input(s): LATICACIDVEN, PROCALCITON, O2SATVEN in the last 168 hours.   ABG  Recent Labs Lab 12/10/14 0400 12/11/14 0348 12/12/14 1728  PHART 7.433 7.436 7.467*  PCO2ART 29.4* 37.7 46.4*  PO2ART 135.0* 122.0* 113.0*   Liver Enzymes  Recent Labs Lab 12/09/14 1737 12/10/14 0315 12/13/14 0400  AST  --  34 120*  ALT  --  39* 157*  ALKPHOS  --  72 65  BILITOT  --  0.2* 0.2*  ALBUMIN 2.4* 2.4*  2.5* 2.1*   Cardiac Enzymes No results for input(s): TROPONINI, PROBNP in the last 168 hours.   Glucose  Recent Labs Lab 12/13/14 0820 12/13/14 1135 12/13/14 1610 12/13/14 1957 12/13/14 2006 12/14/14 0735  GLUCAP 146* 229* 170* 145* 144* 163*   Imaging Dg Chest Port 1 View  12/13/2014   CLINICAL DATA:  Acute respiratory failure  EXAM: PORTABLE CHEST - 1 VIEW  COMPARISON:  12/12/2014  FINDINGS: Endotracheal tube in good position. Left jugular catheter tip in the SVC at the cavoatrial junction unchanged. NG tube enters the stomach with the tip not visualized.  Bibasilar airspace disease with mild progression. Negative for edema or effusion.  IMPRESSION:  Support lines remain in good position.  Progression of bibasilar atelectasis/ infiltrate right greater than left.   Electronically Signed   By: Marlan Palau M.D.   On: 12/13/2014 07:22   ASSESSMENT / PLAN:  NEUROLOGIC A:   Acute encephalopathy >> concern for HSV encephalitis, meningitis, metabolic (hypoglycemia on admission, elevated ammonia, AKI). Hx of ETOH, cocaine abuse. R/O DI - increased UOP 3/29, resolved 3/30.   P:   Monitor mental status RASS goal 0 F/u anti-NMDAR, anti-AMPA  Continue lactulose Thiamine, folic acid  PULMONARY OETT 0/71 >>> A:  Acute respiratory failure secondary to inability to protect airway >> difficult airway. Tobacco abuse. P:   Pressure support wean as tolerated >> mental status prevents extubation trial F/u CXR  CARDIOVASCULAR A: Hx HTN, HLD. P:  Continue norvasc Metoprolol to 50 mg bid on 3/28 Hold outpt lisinopril, simvastatin, nebivolol  RENAL Lab Results  Component Value Date   CREATININE 1.48* 12/14/2014   CREATININE 1.63* 12/13/2014   CREATININE 1.85* 12/12/2014    Recent Labs Lab 12/12/14 1844 12/13/14 0400 12/14/14 0500  K 4.6 4.0 4.0    Recent Labs Lab 12/12/14 1844 12/13/14 0400 12/14/14 0500  NA 153* 155* 155*     A:  AKI >> creatinine might be reaching plateau.  FENa 0.67 %, suggestive of pre-renal failure Gap/non gap metabolic acidosis >> improved 2/19. Hypokalemia. Hypernatremia P:   Renal dose adjust meds D5w at 50 ml/hr Replace electrolytes as needed.   F/u BMET Free h2o If no improvement in renal fx and urine outpt decrease, then will need nephrology evaluation >> defer for now  GASTROINTESTINAL A:  Nutrition. Diarrhea >> C diff negative 3/24. P:   TF on vent Protonix  HEMATOLOGIC  Recent Labs  12/13/14 0400 12/14/14 0500  HGB 7.6* 7.6*    A:  Anemia of critical illness. P:  F/u CBC SQ heparin for DVT prevention  INFECTIOUS A:   Possible meningitis. P:   Day 11  rocephin, vancomycin per ID plan is for 14 days  Blood 3/21 >> neg  ENDOCRINE CBG (last 3)   Recent Labs  12/13/14 1957 12/13/14 2006 12/14/14 0735  GLUCAP 145* 144* 163*     A:  DM with renal complications. P:   SSI Change lantus to 8 units bid on 3/29 Hold outpt pioglitazone, metformin  Pursue trach next week  Brett Canales Gavin Faivre ACNP [facility] Pager [phone] till 3 pm If no answer page [phone] 12/14/2014, 9:09 AM

## 2014-12-14 NOTE — Progress Notes (Signed)
Subjective: Continues on Rocephin and Vancomycin. No overnight events.   CSF NMDA and AMPA studies pending.   Objective: Current vital signs: BP 130/43 mmHg  Pulse 82  Temp(Src) 100.4 F (38 C) (Oral)  Resp 14  Ht 5\' 4"  (1.626 m)  Wt 103.9 kg (229 lb 0.9 oz)  BMI 39.30 kg/m2  SpO2 100% Vital signs in last 24 hours: Temp:  [98.6 F (37 C)-100.4 F (38 C)] 100.4 F (38 C) (03/31 0800) Pulse Rate:  [77-95] 82 (03/31 0800) Resp:  [14-17] 14 (03/31 0800) BP: (119-174)/(41-70) 130/43 mmHg (03/31 0800) SpO2:  [97 %-100 %] 100 % (03/31 0800) FiO2 (%):  [30 %] 30 % (03/31 0816) Weight:  [103.9 kg (229 lb 0.9 oz)] 103.9 kg (229 lb 0.9 oz) (03/31 0400)  Intake/Output from previous day: 03/30 0701 - 03/31 0700 In: 3305 [I.V.:975; NG/GT:2030; IV Piggyback:300] Out: 4700 [Urine:4400; Stool:300] Intake/Output this shift: Total I/O In: 320 [I.V.:150; NG/GT:120; IV Piggyback:50] Out: 325 [Urine:325] Nutritional status:    Neurologic Exam: Patient is off sedatives, intubated on the vent. Follows no commands. Eyes are open, does not make eye contact or track examiner. Will open eyes to voice/noxious stimuli.  CN 2-12: pupils 4 mm bilaterally reactive, no gaze preference, no nystagmus. Decreased blink to threat bilaterally.Corneal responses present, face symmetric, tongue: intubated. Motor: no spontaneous movement Sensory: reacts to pain. But does not localize or withdrawal.  Plantars: right upgoing,  left downgoing. Coordination and gait: unable to test. Neck supple.  Lab Results: Basic Metabolic Panel:  Recent Labs Lab 12/08/14 0515 12/09/14 0458 12/09/14 1737 12/10/14 0315  12/11/14 0455 12/12/14 0445 12/12/14 1844 12/13/14 0400 12/14/14 0500  NA 145 147* 140 145  < > 148* 153* 153* 155* 155*  K 3.4* 3.2* 4.6 3.0*  < > 2.8* 2.7* 4.6 4.0 4.0  CL 114* 113* 107 110  < > 110 109 112 114* 116*  CO2 19 17* 18* 21  < > 24 33* 34* 33* 34*  GLUCOSE 234* 243* 375* 306*  < > 180*  136* 193* 171* 221*  BUN 57* 91* 105* 105*  < > 117* 100* 86* 79* 66*  CREATININE 3.11* 4.54* 4.70* 4.72*  < > 3.54* 2.41* 1.85* 1.63* 1.48*  CALCIUM 8.3* 8.7 8.3* 8.3*  < > 7.9* 8.6 8.6 8.4 8.7  MG 3.1* 3.0*  --   --   --  2.6*  --  2.1  --   --   PHOS 5.0* 5.8* 7.0* 6.1*  --   --   --  2.9  --   --   < > = values in this interval not displayed.  Liver Function Tests:  Recent Labs Lab 12/09/14 1737 12/10/14 0315 12/13/14 0400  AST  --  34 120*  ALT  --  39* 157*  ALKPHOS  --  72 65  BILITOT  --  0.2* 0.2*  PROT  --  6.4 5.5*  ALBUMIN 2.4* 2.4*  2.5* 2.1*   No results for input(s): LIPASE, AMYLASE in the last 168 hours.  Recent Labs Lab 12/08/14 1030  AMMONIA 16    CBC:  Recent Labs Lab 12/10/14 0315 12/11/14 0455 12/12/14 0445 12/13/14 0400 12/14/14 0500  WBC 15.7* 13.6* 8.7 8.8 12.1*  HGB 8.2* 8.5* 8.7* 7.6* 7.6*  HCT 25.0* 25.6* 26.9* 24.0* 24.8*  MCV 91.6 91.8 93.4 96.4 98.4  PLT 215 237 236 183 186    Cardiac Enzymes:  Recent Labs Lab 12/08/14 0515 12/09/14 0458  CKTOTAL  167 104    Lipid Panel: No results for input(s): CHOL, TRIG, HDL, CHOLHDL, VLDL, LDLCALC in the last 168 hours.  CBG:  Recent Labs Lab 12/13/14 1135 12/13/14 1610 12/13/14 1957 12/13/14 2006 12/14/14 0735  GLUCAP 229* 170* 145* 144* 163*    Microbiology: Results for orders placed or performed during the hospital encounter of 12/04/14  Blood Culture (routine x 2)     Status: None   Collection Time: 12/04/14  8:10 PM  Result Value Ref Range Status   Specimen Description BLOOD RIGHT WRIST  Final   Special Requests BOTTLES DRAWN AEROBIC AND ANAEROBIC 5CC EACH  Final   Culture   Final    NO GROWTH 5 DAYS Performed at [facility]    Report Status 12/11/2014 FINAL  Final  Blood Culture (routine x 2)     Status: None   Collection Time: 12/04/14  8:11 PM  Result Value Ref Range Status   Specimen Description BLOOD LEFT ARM  Final   Special Requests BOTTLES  DRAWN AEROBIC AND ANAEROBIC 5CC EACH  Final   Culture   Final    NO GROWTH 5 DAYS Performed at [facility]    Report Status 12/11/2014 FINAL  Final  Urine culture     Status: None   Collection Time: 12/04/14  9:31 PM  Result Value Ref Range Status   Specimen Description URINE, CLEAN CATCH  Final   Special Requests NONE  Final   Colony Count   Final    >=100,000 COLONIES/ML Performed at [facility]    Culture   Final    Multiple bacterial morphotypes present, none predominant. Suggest appropriate recollection if clinically indicated. Performed at [facility]    Report Status 12/05/2014 FINAL  Final  MRSA PCR Screening     Status: None   Collection Time: 12/05/14  4:23 AM  Result Value Ref Range Status   MRSA by PCR NEGATIVE NEGATIVE Final    Comment:        The GeneXpert MRSA Assay (FDA approved for NASAL specimens only), is one component of a comprehensive MRSA colonization surveillance program. It is not intended to diagnose MRSA infection nor to guide or monitor treatment for MRSA infections.   CSF culture     Status: None (Preliminary result)   Collection Time: 12/06/14 11:25 AM  Result Value Ref Range Status   Specimen Description CSF  Final   Special Requests Normal  Final   Gram Stain   Final    NO WBC SEEN NO ORGANISMS SEEN CYTOSPIN SLIDE Performed at [facility]    Culture NO GROWTH Performed at [facility]   Final   Report Status PENDING  Incomplete  Clostridium Difficile by PCR     Status: None   Collection Time: 12/07/14  4:28 PM  Result Value Ref Range Status   C difficile by pcr NEGATIVE NEGATIVE Final    Coagulation Studies: No results for input(s): LABPROT, INR in the last 72 hours.  Imaging: Dg Chest Port 1 View  12/14/2014   CLINICAL DATA:  Respiratory failure.  EXAM: PORTABLE CHEST - 1 VIEW  COMPARISON:  12/13/2014.  12/12/2014.  FINDINGS: Endotracheal tube, left IJ line, NG tube in stable  position. Low lung volumes with stable bibasilar atelectasis and or infiltrates. Heart size stable. Normal pulmonary vascularity. No pleural effusion or pneumothorax. No acute osseus abnormality.  IMPRESSION: 1. Lines and tubes in stable position. 2. Persistent bibasilar atelectasis and/or infiltrates.  Electronically Signed   By: Maisie Fus  Register   On: 12/14/2014 07:38   Dg Chest Port 1 View  12/13/2014   CLINICAL DATA:  Acute respiratory failure  EXAM: PORTABLE CHEST - 1 VIEW  COMPARISON:  12/12/2014  FINDINGS: Endotracheal tube in good position. Left jugular catheter tip in the SVC at the cavoatrial junction unchanged. NG tube enters the stomach with the tip not visualized.  Bibasilar airspace disease with mild progression. Negative for edema or effusion.  IMPRESSION: Support lines remain in good position.  Progression of bibasilar atelectasis/ infiltrate right greater than left.   Electronically Signed   By: Marlan Palau M.D.   On: 12/13/2014 07:22    Medications:  Scheduled: . amLODipine  10 mg Per Tube Daily  . antiseptic oral rinse  7 mL Mouth Rinse QID  . cefTRIAXone (ROCEPHIN)  IV  2 g Intravenous Q12H  . chlorhexidine  15 mL Mouth Rinse BID  . feeding supplement (PRO-STAT SUGAR FREE 64)  30 mL Oral BID  . feeding supplement (VITAL HIGH PROTEIN)  1,000 mL Per Tube Q24H  . folic acid  1 mg Per Tube Daily  . free water  250 mL Per Tube Q4H  . insulin aspart  0-20 Units Subcutaneous 6 times per day  . insulin glargine  8 Units Subcutaneous BID  . lactulose  20 g Per Tube Daily  . metoprolol tartrate  50 mg Per Tube BID  . pantoprazole sodium  40 mg Per Tube Q24H  . thiamine  100 mg Per Tube Daily    Assessment/Plan: 59 y.o. female admitted to the hospital after being found unresponsive at home. Initial work up revealed that CG was 54, elevated ammonia , and CT brain without acute abnormality. MRI brain 3/22 revealed signal abnormality on diffusion images involving the body and tail  of the hippocampus extending into the posterior cingulate gyrus. No significant abnormal enhancement. Overall, such findings could represent post ictal changes, hypoglycemic encephalopathy, limbic encephalitis. EEG without electrographic seizures but a pattern most consistent with encephalopathy.  Unclear etiology of continued encephalopathy. Appreciate ID input, will continue rocephin and vancomycin. CSF NMDA and AMPA studies pending.    Will continue to follow as needed.    Elspeth Cho, DO [facility] [phone]  If 7pm- 7am, please page neurology on call as listed in AMION.

## 2014-12-15 LAB — CBC
HEMATOCRIT: 23.8 % — AB (ref 36.0–46.0)
HEMOGLOBIN: 7.4 g/dL — AB (ref 12.0–15.0)
MCH: 30.8 pg (ref 26.0–34.0)
MCHC: 31.1 g/dL (ref 30.0–36.0)
MCV: 99.2 fL (ref 78.0–100.0)
Platelets: 207 10*3/uL (ref 150–400)
RBC: 2.4 MIL/uL — ABNORMAL LOW (ref 3.87–5.11)
RDW: 15.1 % (ref 11.5–15.5)
WBC: 11.8 10*3/uL — ABNORMAL HIGH (ref 4.0–10.5)

## 2014-12-15 LAB — MAGNESIUM: MAGNESIUM: 1.7 mg/dL (ref 1.5–2.5)

## 2014-12-15 LAB — CSF CULTURE: Culture: NO GROWTH

## 2014-12-15 LAB — GLUCOSE, CAPILLARY
GLUCOSE-CAPILLARY: 180 mg/dL — AB (ref 70–99)
GLUCOSE-CAPILLARY: 232 mg/dL — AB (ref 70–99)
GLUCOSE-CAPILLARY: 178 mg/dL — AB (ref 70–99)
GLUCOSE-CAPILLARY: 180 mg/dL — AB (ref 70–99)
Glucose-Capillary: 216 mg/dL — ABNORMAL HIGH (ref 70–99)
Glucose-Capillary: 168 mg/dL — ABNORMAL HIGH (ref 70–99)

## 2014-12-15 LAB — BASIC METABOLIC PANEL
ANION GAP: 7 (ref 5–15)
BUN: 53 mg/dL — ABNORMAL HIGH (ref 6–23)
CO2: 32 mmol/L (ref 19–32)
Calcium: 8.8 mg/dL (ref 8.4–10.5)
Chloride: 114 mmol/L — ABNORMAL HIGH (ref 96–112)
Creatinine, Ser: 1.27 mg/dL — ABNORMAL HIGH (ref 0.50–1.10)
GFR calc Af Amer: 52 mL/min — ABNORMAL LOW (ref >90)
GFR calc non Af Amer: 45 mL/min — ABNORMAL LOW (ref >90)
Glucose, Bld: 207 mg/dL — ABNORMAL HIGH (ref 70–99)
POTASSIUM: 4 mmol/L (ref 3.5–5.1)
SODIUM: 153 mmol/L — AB (ref 135–145)

## 2014-12-15 LAB — VANCOMYCIN, RANDOM: Vancomycin Rm: 18.2 ug/mL

## 2014-12-15 LAB — CSF CULTURE W GRAM STAIN
Gram Stain: NONE SEEN
Special Requests: NORMAL

## 2014-12-15 LAB — PHOSPHORUS: PHOSPHORUS: 3.5 mg/dL (ref 2.3–4.6)

## 2014-12-15 MED ORDER — METOPROLOL TARTRATE 25 MG/10 ML ORAL SUSPENSION
100.0000 mg | Freq: Two times a day (BID) | ORAL | Status: DC
  Administered 2014-12-15 – 2014-12-16 (×2): 100 mg
  Administered 2014-12-16: 25 mg
  Administered 2014-12-17 – 2014-12-27 (×20): 100 mg
  Filled 2014-12-15 (×27): qty 40

## 2014-12-15 MED ORDER — VANCOMYCIN HCL IN DEXTROSE 750-5 MG/150ML-% IV SOLN
750.0000 mg | Freq: Once | INTRAVENOUS | Status: AC
Start: 2014-12-15 — End: 2014-12-15
  Administered 2014-12-15: 750 mg via INTRAVENOUS
  Filled 2014-12-15 (×2): qty 150

## 2014-12-15 NOTE — Progress Notes (Signed)
ANTIBIOTIC CONSULT NOTE - FOLLOW UP  Pharmacy Consult for Vancomycin, Ceftriaxone Indication: suspected meningitis  No Known Allergies  Patient Measurements: Height: 5\' 4"  (162.6 cm) Weight: 227 lb 8.2 oz (103.2 kg) IBW/kg (Calculated) : 54.7  Vital Signs: Temp: 98.5 F (36.9 C) (04/01 0400) Temp Source: Oral (04/01 0400) BP: 149/51 mmHg (04/01 0600) Pulse Rate: 78 (04/01 0600) Intake/Output from previous day: 03/31 0701 - 04/01 0700 In: 3443.3 [I.V.:1093.3; NG/GT:2100; IV Piggyback:250] Out: 4600 [Urine:4100; Stool:500]  Labs:  Recent Labs  12/13/14 0400 12/14/14 0500 12/15/14 0430  WBC 8.8 12.1* 11.8*  HGB 7.6* 7.6* 7.4*  PLT 183 186 207  CREATININE 1.63* 1.48* 1.27*   Estimated Creatinine Clearance: 55.1 mL/min (by C-G formula based on Cr of 1.27).  Recent Labs  12/13/14 0400 12/14/14 0816 12/15/14 0755  VANCOTROUGH  --  19.6  --   VANCORANDOM 18.7  --  18.2    Assessment: 58 y/oF with PMH of DM, HTN, HLD who presents with hypoglycemia and AMS after being found unresponsive at home. Pharmacy consulted to assist with dosing of Ceftriaxone for urosepsis. MD added abx for meningitis coverage on 3/21. LP performed 3/23, but unreliable for culture.  Anti-infectives 3/21 >> Ceftriaxone >> 3/21 >> vanc >> 3/22 >> ampicillin >> 3/26 3/22 >> acyclovir for r/o HSV >> 3/26  Micro: 3/21 blood x 2: NGF 3/21 urine: >100k col/ml, none predominant FINAL 3/23 CSF: high gluc, nml protein, high WBC; non-cloudy 3/23 CSF: NGF 3/22 HIV/RPR both negative 3/22 VDRL CSF: IP 3/24 cdiff (-) 3/23 LP: glucose 96 (slightly high), protein 42, wbc high 128, neutrophils high 82 3/23: HSV PCR (-) 3/29 [place] spotted fvr: negative  Drug level / dose changes info: 3/23 2100 VT = 20.9 on 750 mg IV q12, changed to 500 mg q12h 3/24 2100 VT = 37.4 after one dose of 500 q12 (HOLD) 3/25 2245 VT = 33.4 (cont to hold) 3/26 2246 Vanc level = 26.9 (cont to hold) 3/27 2200 Vanc level  = 20.1 (cont to hold) 3/28 0500 Vanc level = 17 ==> Vanc 1500mg  x 1 3/29 1000 Vanc level = 27.6, hold vanc 3/30 0400 Vanc level = 18.7 ==> Vanc 1g x 1, ke 0.0229 3/31 0800 Vanc level = 19.6 ==> Vanc 750mg  x 1 4/1 0800 Vanc level = 18.2 ==> Vanc 750mg  x 1  Today, 12/15/2014:  Tmax: 100.4  WBCs: 11.8 (decadron 3/24-3/26)  Renal: AKI improving, SCr 1.27; CrCl 55 CG, 53N  4/1 Today is D12 Vanc (dosing on levels) and Ceftriaxone 2g IV q12h.  SCr continues to improve and vanc rand level today remains therapeutic.   Goal of Therapy:  Vancomycin trough level 15-20 mcg/ml  Appropriate abx dosing, eradication of infection.   Plan:   Continue ceftriaxone 2g IV q12h  Vancomycin 750mg  IV x1  Dosing daily based on levels d/t severity of infection and variability in SCr.  Recheck daily Vanc random level until SCr stabilizes.   Follow up renal fxn, culture results, and clinical course.    Lynann Beaver PharmD, BCPS Pager [phone] 12/15/2014 7:36 AM

## 2014-12-15 NOTE — Progress Notes (Signed)
PULMONARY / CRITICAL CARE MEDICINE   Name: Gina Cox MRN: [id number] DOB: 02/04/1956    ADMISSION DATE:  12/04/2014  REFERRING MD :  Madilyn Hook EDP  CHIEF COMPLAINT:  Found down  INITIAL PRESENTATION: 59 y/o female found unresponsive by family at home.  CBG 54 in ER, and intubated for airway protection >> Difficult airway.  STUDIES:  3/21 CT head >> no acute intracranial process 3/22 MRI Brain >> hippocampal diffusion signal abnormal to the posterior singulate gyrus  3/22 EEG >> no seizure activity 3/22 LP >> glucose 96, protein 42, WBC 128 (82%N), RBC 13 3/25 EEG >> toxic/metabolic encephalopathy.  No seizure activity 3/28 MRI Brain >> stable, abnormal brain MR with hyperintensity in limbic system affecting the hippocampus  SIGNIFICANT EVENTS: 3/21  Admit 3/22  Neurology consulted 3/24  ID consulted 3/26  acyclovir, ampicillin, decadron d/c'ed 4/01  Pt with new but minimal movement of fingers / toes on command  SUBJECTIVE:  RN reports pt weaned 3-4 hours yesterday.  No acute events.  New but minimal movement of fingers / toes to command   VITAL SIGNS: Temp:  [98.3 F (36.8 C)-98.8 F (37.1 C)] 98.8 F (37.1 C) (04/01 0810) Pulse Rate:  [78-89] 86 (04/01 1000) Resp:  [11-40] 40 (04/01 1000) BP: (149-177)/(51-66) 177/63 mmHg (04/01 1000) SpO2:  [99 %-100 %] 99 % (04/01 1000) FiO2 (%):  [30 %] 30 % (04/01 1000) Weight:  [227 lb 8.2 oz (103.2 kg)] 227 lb 8.2 oz (103.2 kg) (04/01 0400)   VENTILATOR SETTINGS: Vent Mode:  [-] PRVC FiO2 (%):  [30 %] 30 % Set Rate:  [14 bmp] 14 bmp Vt Set:  [440 mL] 440 mL PEEP:  [5 cmH20] 5 cmH20 Pressure Support:  [15 cmH20] 15 cmH20 Plateau Pressure:  [10 cmH20-16 cmH20] 10 cmH20   INTAKE / OUTPUT:  Intake/Output Summary (Last 24 hours) at 12/15/14 1130 Last data filed at 12/15/14 1120  Gross per 24 hour  Intake   3345 ml  Output   4435 ml  Net  -1090 ml   PHYSICAL EXAMINATION: General: no distress Neuro: opens eyes with  stimulation, wiggles fingers / toes (very weakly) to command  HEENT: ETT in place, mm pink moist Cardiovascular: regular Lungs: even/non-labored, scattered rhonchi Abdomen: soft, non tender Musculoskeletal: 2+ edema Skin: no rashes  LABS:  CBC  Recent Labs Lab 12/13/14 0400 12/14/14 0500 12/15/14 0430  WBC 8.8 12.1* 11.8*  HGB 7.6* 7.6* 7.4*  HCT 24.0* 24.8* 23.8*  PLT 183 186 207    BMET  Recent Labs Lab 12/13/14 0400 12/14/14 0500 12/15/14 0430  NA 155* 155* 153*  K 4.0 4.0 4.0  CL 114* 116* 114*  CO2 33* 34* 32  BUN 79* 66* 53*  CREATININE 1.63* 1.48* 1.27*  GLUCOSE 171* 221* 207*   Electrolytes  Recent Labs Lab 12/10/14 0315  12/11/14 0455  12/12/14 1844 12/13/14 0400 12/14/14 0500 12/15/14 0430  CALCIUM 8.3*  < > 7.9*  < > 8.6 8.4 8.7 8.8  MG  --   --  2.6*  --  2.1  --   --  1.7  PHOS 6.1*  --   --   --  2.9  --   --  3.5  < > = values in this interval not displayed.    ABG  Recent Labs Lab 12/10/14 0400 12/11/14 0348 12/12/14 1728  PHART 7.433 7.436 7.467*  PCO2ART 29.4* 37.7 46.4*  PO2ART 135.0* 122.0* 113.0*   Liver Enzymes  Recent  Labs Lab 12/09/14 1737 12/10/14 0315 12/13/14 0400  AST  --  34 120*  ALT  --  39* 157*  ALKPHOS  --  72 65  BILITOT  --  0.2* 0.2*  ALBUMIN 2.4* 2.4*  2.5* 2.1*   Glucose  Recent Labs Lab 12/14/14 0735 12/14/14 1116 12/14/14 1720 12/14/14 2005 12/15/14 0028 12/15/14 0349  GLUCAP 163* 165* 211* 196* 216* 180*   Imaging Dg Chest Port 1 View  12/14/2014   CLINICAL DATA:  Respiratory failure.  EXAM: PORTABLE CHEST - 1 VIEW  COMPARISON:  12/13/2014.  12/12/2014.  FINDINGS: Endotracheal tube, left IJ line, NG tube in stable position. Low lung volumes with stable bibasilar atelectasis and or infiltrates. Heart size stable. Normal pulmonary vascularity. No pleural effusion or pneumothorax. No acute osseus abnormality.  IMPRESSION: 1. Lines and tubes in stable position. 2. Persistent bibasilar  atelectasis and/or infiltrates.   Electronically Signed   By: Maisie Fus  Register   On: 12/14/2014 07:38   ASSESSMENT / PLAN:  NEUROLOGIC A:   Acute encephalopathy - concern for HSV encephalitis, meningitis, metabolic (hypoglycemia on admission, elevated ammonia, AKI). 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 P:   Monitor mental status RASS goal 0 F/u anti-NMDAR, anti-AMPA  Continue lactulose Thiamine, folic acid Passive ROM   PULMONARY OETT 3/21 >>> A:  Acute respiratory failure secondary to inability to protect airway >> difficult airway. Tobacco abuse. P:   Pressure support wean as tolerated >> mental status prevents extubation trial F/u CXR intermittently   CARDIOVASCULAR A: Hx HTN, HLD. P:  Continue norvasc 10 mg  Metoprolol to 100 mg bid on 4/1 Hold outpt lisinopril, simvastatin, nebivolol  RENAL A:  AKI - creatinine plateau, now improving .  FENa 0.67 %, suggestive of pre-renal failure Gap/non gap metabolic acidosis >> improved 1/61. Hypokalemia. Hypernatremia P:   Renal dose adjust meds D5w at 75 ml/hr Replace electrolytes as needed.   F/u BMET Free water 250 ml Q4 Would like to diurese but Na rising   GASTROINTESTINAL A:  Nutrition. Diarrhea >> C diff negative 3/24. P:   TF on vent Protonix  HEMATOLOGIC A:  Anemia of critical illness. P:  F/u CBC SQ heparin for DVT prevention  INFECTIOUS A:   Possible meningitis. P:   Rocephin, vancomycin per ID plan is for 14 days  Blood 3/21 >> neg  ENDOCRINE A:  DM with renal complications. P:   SSI Change lantus to 8 units bid on 3/29 Hold outpt pioglitazone, metformin   Pursue trach next week, Monday is day 14.  Family aware of possibility of need and agreeable if will allow for recovery.   Canary Brim, NP-C [facility] Pgr: [phone] or [phone]    12/15/2014, 11:30 AM

## 2014-12-15 NOTE — Progress Notes (Signed)
Inpatient Diabetes Program Recommendations  AACE/ADA: New Consensus Statement on Inpatient Glycemic Control (2013)  Target Ranges:  Prepandial:   less than 140 mg/dL      Peak postprandial:   less than 180 mg/dL (1-2 hours)      Critically ill patients:  140 - 180 mg/dL   Reason for Assessment: Hyperglycemia  Results for ADDASYN, HELSEL (MRN [id number]) as of 12/15/2014 11:48  Ref. Range 12/14/2014 17:20 12/14/2014 20:05 12/15/2014 00:28 12/15/2014 03:49  Glucose-Capillary Latest Range: 70-99 mg/dL 974 (H) 163 (H) 845 (H) 180 (H)   Would benefit from TF insulin coverage.  Results for DYNA, DWIGHT (MRN [id number]) as of 12/15/2014 11:48  Ref. Range 12/14/2014 17:20 12/14/2014 20:05 12/15/2014 00:28 12/15/2014 03:49  Glucose-Capillary Latest Range: 70-99 mg/dL 224 (H) 825 (H) 003 (H) 180 (H)     MD- Please consider the following while patient receiving tube feeds -  Add Novolog tube feed coverage- Novolog 3 units Q4 hours (hold if tube feeds held for any reason)  Will continue to follow. Thank you. Ailene Ards, RD, LDN, CDE Inpatient Diabetes Coordinator [phone]

## 2014-12-16 ENCOUNTER — Inpatient Hospital Stay (HOSPITAL_COMMUNITY): Payer: Medicare Other

## 2014-12-16 LAB — BASIC METABOLIC PANEL
ANION GAP: 14 (ref 5–15)
BUN: 43 mg/dL — ABNORMAL HIGH (ref 6–23)
CALCIUM: 8.3 mg/dL — AB (ref 8.4–10.5)
CHLORIDE: 104 mmol/L (ref 96–112)
CO2: 30 mmol/L (ref 19–32)
Creatinine, Ser: 1.21 mg/dL — ABNORMAL HIGH (ref 0.50–1.10)
GFR calc Af Amer: 55 mL/min — ABNORMAL LOW (ref >90)
GFR calc non Af Amer: 48 mL/min — ABNORMAL LOW (ref >90)
GLUCOSE: 230 mg/dL — AB (ref 70–99)
POTASSIUM: 3.7 mmol/L (ref 3.5–5.1)
Sodium: 148 mmol/L — ABNORMAL HIGH (ref 135–145)

## 2014-12-16 LAB — GLUCOSE, CAPILLARY
GLUCOSE-CAPILLARY: 203 mg/dL — AB (ref 70–99)
Glucose-Capillary: 240 mg/dL — ABNORMAL HIGH (ref 70–99)
Glucose-Capillary: 211 mg/dL — ABNORMAL HIGH (ref 70–99)
Glucose-Capillary: 235 mg/dL — ABNORMAL HIGH (ref 70–99)
Glucose-Capillary: 141 mg/dL — ABNORMAL HIGH (ref 70–99)

## 2014-12-16 LAB — CBC
HCT: 23.2 % — ABNORMAL LOW (ref 36.0–46.0)
Hemoglobin: 7.5 g/dL — ABNORMAL LOW (ref 12.0–15.0)
MCH: 32.1 pg (ref 26.0–34.0)
MCHC: 32.3 g/dL (ref 30.0–36.0)
MCV: 99.1 fL (ref 78.0–100.0)
PLATELETS: 190 10*3/uL (ref 150–400)
RBC: 2.34 MIL/uL — ABNORMAL LOW (ref 3.87–5.11)
RDW: 15.1 % (ref 11.5–15.5)
WBC: 12 10*3/uL — AB (ref 4.0–10.5)

## 2014-12-16 LAB — VANCOMYCIN, RANDOM: Vancomycin Rm: 16.5 ug/mL

## 2014-12-16 MED ORDER — VANCOMYCIN HCL IN DEXTROSE 1-5 GM/200ML-% IV SOLN
1000.0000 mg | INTRAVENOUS | Status: AC
  Administered 2014-12-16 – 2014-12-17 (×2): 1000 mg via INTRAVENOUS
  Filled 2014-12-16 (×2): qty 200

## 2014-12-16 MED ORDER — POTASSIUM CHLORIDE 20 MEQ/15ML (10%) PO SOLN
20.0000 meq | ORAL | Status: AC
  Administered 2014-12-16 (×2): 20 meq
  Filled 2014-12-16 (×2): qty 15

## 2014-12-16 NOTE — Progress Notes (Signed)
ANTIBIOTIC CONSULT NOTE - FOLLOW UP  Pharmacy Consult for Vancomycin, Ceftriaxone Indication: suspected meningitis  No Known Allergies  Patient Measurements: Height: 5\' 4"  (162.6 cm) Weight: 223 lb 5.2 oz (101.3 kg) IBW/kg (Calculated) : 54.7  Vital Signs: Temp: 98.6 F (37 C) (04/02 0800) Temp Source: Oral (04/02 0800) BP: 116/42 mmHg (04/02 0600) Pulse Rate: 79 (04/02 0600) Intake/Output from previous day: 04/01 0701 - 04/02 0700 In: 4562.5 [I.V.:1557.5; NG/GT:2630; IV Piggyback:250] Out: 3350 [Urine:3100; Stool:250]  Labs:  Recent Labs  12/14/14 0500 12/15/14 0430 12/16/14 0430  WBC 12.1* 11.8* 12.0*  HGB 7.6* 7.4* 7.5*  PLT 186 207 190  CREATININE 1.48* 1.27* 1.21*   Estimated Creatinine Clearance: 57.2 mL/min (by C-G formula based on Cr of 1.21).  Recent Labs  12/14/14 0816 12/15/14 0755  VANCOTROUGH 19.6  --   VANCORANDOM  --  18.2    Assessment: 58 y/oF with PMH of DM, HTN, HLD who presents with hypoglycemia and AMS after being found unresponsive at home. Pharmacy consulted to assist with dosing of Ceftriaxone for urosepsis. MD added abx for meningitis coverage on 3/21. LP performed 3/23, but unreliable for culture.  Anti-infectives 3/21 >> Ceftriaxone >> 3/21 >> vanc >> 3/22 >> ampicillin >> 3/26 3/22 >> acyclovir for r/o HSV >> 3/26  Micro: 3/21 blood x 2: NGF 3/21 urine: >100k col/ml, none predominant FINAL 3/23 CSF: high gluc, nml protein, high WBC; non-cloudy 3/23 CSF: NGF 3/22 HIV/RPR both negative 3/22 VDRL CSF: IP 3/24 cdiff (-) 3/23 LP: glucose 96 (slightly high), protein 42, wbc high 128, neutrophils high 82 3/23: HSV PCR (-) 3/29 [place] spotted fvr: negative  Drug level / dose changes info: 3/23 2100 VT = 20.9 on 750 mg IV q12, changed to 500 mg q12h 3/24 2100 VT = 37.4 after one dose of 500 q12 (HOLD) 3/25 2245 VT = 33.4 (cont to hold) 3/26 2246 Vanc level = 26.9 (cont to hold) 3/27 2200 Vanc level = 20.1 (cont to  hold) 3/28 0500 Vanc level = 17 ==> Vanc 1500mg  x 1 3/29 1000 Vanc level = 27.6, hold vanc 3/30 0400 Vanc level = 18.7 ==> Vanc 1g x 1, ke 0.0229 3/31 0800 Vanc level = 19.6 ==> Vanc 750mg  x 1 4/1 0800 Vanc level = 18.2 ==> Vanc 750mg  x 1 4/2 0800 Vanc level = 16.5  ==> Vanc 1g IV q24h  Today, 12/16/2014:  Tmax: 99  WBCs: 12 (decadron 3/24-3/26)  Renal: AKI improving, SCr 1.21; CrCl 57 CG, 56N  4/2 Today is D13 Vanc (dosing on levels) and Ceftriaxone 2g IV q12h.  Improving aeration on CXR today.  SCr continues to improve and vanc rand level today remains therapeutic.   Goal of Therapy:  Vancomycin trough level 15-20 mcg/ml  Appropriate abx dosing, eradication of infection.   Plan:   Continue ceftriaxone 2g IV q12h  Vancomycin 1000mg  IV q24h x2 doses   Duration: per ID planning for 14 days, which will be completed after 4/3  Follow up renal fxn, culture results, and clinical course.    Lynann Beaver PharmD, BCPS Pager [phone] 12/16/2014 8:54 AM

## 2014-12-16 NOTE — Progress Notes (Signed)
[facility] ADULT ICU REPLACEMENT PROTOCOL FOR AM LAB REPLACEMENT ONLY  The patient does apply for the [facility] Adult ICU Electrolyte Replacment Protocol based on the criteria listed below:   1. Is GFR >/= 40 ml/min? Yes.    Patient's GFR today is 48 2. Is urine output >/= 0.5 ml/kg/hr for the last 6 hours? Yes.   Patient's UOP is 1.3 ml/kg/hr 3. Is BUN < 60 mg/dL? Yes.    Patient's BUN today is 43 4. Abnormal electrolyte(s): K3.7 5. Ordered repletion with: Kcl-via tube 6. If a panic level lab has been reported, has the CCM MD in charge been notified? Yes.  .   Physician:  S Sommer,MD  Melrose Nakayama 12/16/2014 6:19 AM

## 2014-12-16 NOTE — Progress Notes (Signed)
PULMONARY / CRITICAL CARE MEDICINE   Name: MARIALI GUILE MRN: [id number] DOB: 1956/03/05    ADMISSION DATE:  12/04/2014  REFERRING MD :  Madilyn Hook EDP  CHIEF COMPLAINT:  Found down  INITIAL PRESENTATION: 59 y/o female found unresponsive by family at home.  CBG 54 in ER, and intubated for airway protection >> Difficult airway.  STUDIES:  3/21 CT head >> no acute intracranial process 3/22 MRI Brain >> hippocampal diffusion signal abnormal to the posterior singulate gyrus  3/22 EEG >> no seizure activity 3/22 LP >> glucose 96, protein 42, WBC 128 (82%N), RBC 13 3/25 EEG >> toxic/metabolic encephalopathy.  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   VITAL SIGNS: Temp:  [98.4 F (36.9 C)-99 F (37.2 C)] 98.6 F (37 C) (04/02 0800) Pulse Rate:  [72-86] 85 (04/02 0820) Resp:  [0-33] 24 (04/02 0820) BP: (116-163)/(40-64) 129/53 mmHg (04/02 0800) SpO2:  [98 %-100 %] 99 % (04/02 0820) FiO2 (%):  [30 %] 30 % (04/02 0943) Weight:  [101.3 kg (223 lb 5.2 oz)] 101.3 kg (223 lb 5.2 oz) (04/02 0400)   VENTILATOR SETTINGS: Vent Mode:  [-] PSV FiO2 (%):  [30 %] 30 % Set Rate:  [14 bmp] 14 bmp Vt Set:  [440 mL] 440 mL PEEP:  [5 cmH20] 5 cmH20 Pressure Support:  [12 cmH20] 12 cmH20 Plateau Pressure:  [13 cmH20-16 cmH20] 15 cmH20   INTAKE / OUTPUT:  Intake/Output Summary (Last 24 hours) at 12/16/14 1127 Last data filed at 12/16/14 0820  Gross per 24 hour  Intake 3692.5 ml  Output   3140 ml  Net  552.5 ml   PHYSICAL EXAMINATION: General: no distress Neuro: Eyes are open, would not track or follow commands (she did  reportedly wiggle fingers / toes (very weakly) to command 4/1 HEENT: ETT in place, mm pink moist Cardiovascular: regular Lungs: even/non-labored, scattered rhonchi Abdomen: soft, non tender Musculoskeletal: 2+ edema Skin: no rashes  LABS:  CBC  Recent Labs Lab 12/14/14 0500 12/15/14 0430 12/16/14 0430  WBC 12.1* 11.8* 12.0*  HGB 7.6* 7.4* 7.5*  HCT 24.8* 23.8* 23.2*  PLT 186 207 190    BMET  Recent Labs Lab 12/14/14 0500 12/15/14 0430 12/16/14 0430  NA 155* 153* 148*  K 4.0 4.0 3.7  CL 116* 114* 104  CO2 34* 32 30  BUN 66* 53* 43*  CREATININE 1.48* 1.27* 1.21*  GLUCOSE 221* 207* 230*   Electrolytes  Recent Labs Lab 12/10/14 0315  12/11/14 0455  12/12/14 1844  12/14/14 0500 12/15/14 0430 12/16/14 0430  CALCIUM 8.3*  < > 7.9*  < > 8.6  < > 8.7 8.8 8.3*  MG  --   --  2.6*  --  2.1  --   --  1.7  --   PHOS 6.1*  --   --   --  2.9  --   --  3.5  --   < > = values in this interval not displayed.    ABG  Recent Labs Lab 12/10/14 0400 12/11/14 0348 12/12/14 1728  PHART 7.433 7.436 7.467*  PCO2ART 29.4* 37.7 46.4*  PO2ART 135.0* 122.0*  113.0*   Liver Enzymes  Recent Labs Lab 12/09/14 1737 12/10/14 0315 12/13/14 0400  AST  --  34 120*  ALT  --  39* 157*  ALKPHOS  --  72 65  BILITOT  --  0.2* 0.2*  ALBUMIN 2.4* 2.4*  2.5* 2.1*   Glucose  Recent Labs Lab 12/15/14 1328 12/15/14 1620 12/15/14 2051 12/16/14 0030 12/16/14 0515 12/16/14 0821  GLUCAP 232* 178* 180* 240* 211* 235*   Imaging No results found. ASSESSMENT / PLAN:  NEUROLOGIC A:   Acute encephalopathy - concern for HSV encephalitis, meningitis, metabolic (hypoglycemia on admission, elevated ammonia, AKI). 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  P:   Monitor mental status RASS goal 0 F/u anti-NMDAR, anti-AMPA  Continue lactulose Thiamine, folic acid Passive ROM  Suspect poor prognosis for recovery here. Will need to address possible  withdrawal of care if no improvment  PULMONARY OETT 3/21 >>> A:  Acute respiratory failure secondary to inability to protect airway >> difficult airway. Tobacco abuse. P:   Pressure support wean as tolerated >> mental status prevents extubation  F/u CXR intermittently   CARDIOVASCULAR A: Hx HTN, HLD. P:  Continue norvasc 10 mg  Metoprolol to 100 mg bid on 4/1 Hold outpt lisinopril, simvastatin, nebivolol  RENAL A:  AKI - creatinine plateau, now improving .  FENa 0.67 %, suggestive of pre-renal failure Gap/non gap metabolic acidosis >> improved 6/57. Hypokalemia. Hypernatremia P:   Renal dose adjust meds D5w at 75 ml/hr Replace electrolytes as needed.   F/u BMET Free water 250 ml Q4 Defer diuresis for now   GASTROINTESTINAL A:  Nutrition. Diarrhea >> C diff negative 3/24. P:   TF on vent Protonix  HEMATOLOGIC A:  Anemia of critical illness. P:  F/u CBC SQ heparin for DVT prevention  INFECTIOUS A:   Possible meningitis. P:   Rocephin, vancomycin per ID plan is for 14 days  Blood 3/21 >> neg  ENDOCRINE A:  DM with renal complications. P:   SSI Change lantus to 8 units bid on 3/29 Hold outpt pioglitazone, metformin   Pursue trach next week, Monday is day 14.  Family aware of possibility of need and agreeable if will allow for recovery.   Levy Pupa, MD, PhD 12/16/2014, 11:35 AM [facility] [phone] or if no answer [phone]

## 2014-12-17 LAB — GLUCOSE, CAPILLARY
GLUCOSE-CAPILLARY: 174 mg/dL — AB (ref 70–99)
GLUCOSE-CAPILLARY: 184 mg/dL — AB (ref 70–99)
GLUCOSE-CAPILLARY: 225 mg/dL — AB (ref 70–99)
GLUCOSE-CAPILLARY: 227 mg/dL — AB (ref 70–99)
GLUCOSE-CAPILLARY: 272 mg/dL — AB (ref 70–99)
Glucose-Capillary: 185 mg/dL — ABNORMAL HIGH (ref 70–99)
Glucose-Capillary: 220 mg/dL — ABNORMAL HIGH (ref 70–99)
Glucose-Capillary: 164 mg/dL — ABNORMAL HIGH (ref 70–99)

## 2014-12-17 LAB — BASIC METABOLIC PANEL
Anion gap: 9 (ref 5–15)
BUN: 43 mg/dL — ABNORMAL HIGH (ref 6–23)
CO2: 29 mmol/L (ref 19–32)
Calcium: 8.2 mg/dL — ABNORMAL LOW (ref 8.4–10.5)
Chloride: 102 mmol/L (ref 96–112)
Creatinine, Ser: 1.19 mg/dL — ABNORMAL HIGH (ref 0.50–1.10)
GFR calc Af Amer: 56 mL/min — ABNORMAL LOW (ref >90)
GFR calc non Af Amer: 48 mL/min — ABNORMAL LOW (ref >90)
GLUCOSE: 247 mg/dL — AB (ref 70–99)
Potassium: 3.9 mmol/L (ref 3.5–5.1)
Sodium: 140 mmol/L (ref 135–145)

## 2014-12-17 NOTE — Progress Notes (Signed)
PULMONARY / CRITICAL CARE MEDICINE   Name: PHILOMENA BUTTERMORE MRN: [id number] DOB: 08/07/1956    ADMISSION DATE:  12/04/2014  REFERRING MD :  Madilyn Hook EDP  CHIEF COMPLAINT:  Found down  INITIAL PRESENTATION: 59 y/o female found unresponsive by family at home.  CBG 54 in ER, and intubated for airway protection >> Difficult airway.  STUDIES:  3/21 CT head >> no acute intracranial process 3/22 MRI Brain >> hippocampal diffusion signal abnormal to the posterior singulate gyrus  3/22 EEG >> no seizure activity 3/22 LP >> glucose 96, protein 42, WBC 128 (82%N), RBC 13 3/25 EEG >> toxic/metabolic encephalopathy.  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   VITAL SIGNS: Temp:  [98.4 F (36.9 C)-99.1 F (37.3 C)] 98.6 F (37 C) (04/03 0800) Pulse Rate:  [74-87] 76 (04/03 0700) Resp:  [13-31] 18 (04/03 0700) BP: (109-131)/(42-59) 118/50 mmHg (04/03 1100) SpO2:  [96 %-100 %] 99 % (04/03 0700) FiO2 (%):  [30 %] 30 % (04/03 1010) Weight:  [103.3 kg (227 lb 11.8 oz)] 103.3 kg (227 lb 11.8 oz) (04/03 0500)   VENTILATOR SETTINGS: Vent Mode:  [-] PRVC FiO2 (%):  [30 %] 30 % Set Rate:  [14 bmp] 14 bmp Vt Set:  [440 mL] 440 mL PEEP:  [5 cmH20] 5 cmH20 Plateau Pressure:  [11 cmH20-17 cmH20] 11 cmH20   INTAKE / OUTPUT:  Intake/Output Summary (Last 24 hours) at 12/17/14 1102 Last data filed at 12/17/14 0700  Gross per 24 hour  Intake   2420 ml  Output   2200 ml  Net    220 ml   PHYSICAL EXAMINATION: General: no distress Neuro: Eyes are open, would not track. She MAY have moved her L arm for me 4/3 to request (asked her to  wiggle fingers) HEENT: ETT in place, mm pink moist Cardiovascular: regular Lungs: even/non-labored, scattered rhonchi Abdomen: soft, non tender Musculoskeletal: 2+ edema Skin: no rashes  LABS:  CBC  Recent Labs Lab 12/14/14 0500 12/15/14 0430 12/16/14 0430  WBC 12.1* 11.8* 12.0*  HGB 7.6* 7.4* 7.5*  HCT 24.8* 23.8* 23.2*  PLT 186 207 190    BMET  Recent Labs Lab 12/15/14 0430 12/16/14 0430 12/17/14 0412  NA 153* 148* 140  K 4.0 3.7 3.9  CL 114* 104 102  CO2 32 30 29  BUN 53* 43* 43*  CREATININE 1.27* 1.21* 1.19*  GLUCOSE 207* 230* 247*   Electrolytes  Recent Labs Lab 12/11/14 0455  12/12/14 1844  12/15/14 0430 12/16/14 0430 12/17/14 0412  CALCIUM 7.9*  < > 8.6  < > 8.8 8.3* 8.2*  MG 2.6*  --  2.1  --  1.7  --   --   PHOS  --   --  2.9  --  3.5  --   --   < > = values in this interval not displayed.    ABG  Recent Labs Lab 12/11/14 0348 12/12/14 1728  PHART 7.436 7.467*  PCO2ART 37.7 46.4*  PO2ART 122.0* 113.0*   Liver Enzymes  Recent Labs Lab 12/13/14 0400  AST 120*  ALT 157*  ALKPHOS 65  BILITOT 0.2*  ALBUMIN 2.1*   Glucose  Recent Labs Lab 12/16/14 1219 12/16/14 1548 12/16/14 2107 12/17/14 0001 12/17/14 0510 12/17/14 0823  GLUCAP 227* 272* 203* 174* 225* 185*   Imaging Dg Chest Port 1 View  12/16/2014   CLINICAL DATA:  J96.00 (ICD-10-CM) - Acute respiratory failureHx of diabetes, HTN. Current every day smoker.  EXAM: PORTABLE CHEST - 1 VIEW  COMPARISON:  12/14/2014 and 12/13/2014.  FINDINGS: 0558 hours. The endotracheal tube, left IJ central venous catheter and nasogastric tube appear unchanged. The heart size and mediastinal contours are stable. There is improved aeration of the left lung base with minimal residual bibasilar atelectasis. No consolidation, edema, pleural effusion or pneumothorax identified. The bones appear unchanged.  IMPRESSION: Improving left basilar aeration.  Stable support system.   Electronically Signed    By: Carey Bullocks M.D.   On: 12/16/2014 08:54   ASSESSMENT / PLAN:  NEUROLOGIC A:   Acute encephalopathy - concern for HSV encephalitis, meningitis, metabolic (hypoglycemia on admission, elevated ammonia, AKI). 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  P:   Monitor mental status RASS goal 0 F/u anti-NMDAR, anti-AMPA > were these done, I do not see as pending Continue lactulose Thiamine, folic acid Passive ROM  Suspect poor prognosis for recovery here. Will need to address possible withdrawal of care if no improvment  PULMONARY OETT 3/21 >>> A:  Acute respiratory failure secondary to inability to protect airway >> difficult airway. Tobacco abuse. P:   Pressure support wean as tolerated >> mental status prevents extubation  F/u CXR intermittently   CARDIOVASCULAR A: Hx HTN, HLD. P:  Continue norvasc 10 mg  Metoprolol to 100 mg bid on 4/1 Hold outpt lisinopril, simvastatin, nebivolol  RENAL A:  AKI - creatinine plateau, now improving .  FENa 0.67 %, suggestive of pre-renal failure Gap/non gap metabolic acidosis >> improved 7/35. Hypokalemia. Hypernatremia P:   Renal dose adjust meds D5w at 75 ml/hr Replace electrolytes as needed.   F/u BMET Free water 250 ml Q4 Defer diuresis for now   GASTROINTESTINAL A:  Nutrition. Diarrhea >> C diff negative 3/24. P:   TF on vent Protonix  HEMATOLOGIC A:  Anemia of critical illness. P:  F/u CBC SQ heparin for DVT prevention  INFECTIOUS A:   Possible meningitis. P:   Rocephin, vancomycin per ID plan is for 14 days  Blood 3/21 >> neg  ENDOCRINE A:  DM with renal complications. P:   SSI Changed lantus to 8 units bid on 3/29 Hold outpt pioglitazone, metformin   Pursue trach next week, Monday is day 14.  Family aware of possibility of need and agreeable if will allow for recovery.   Levy Pupa, MD, PhD 12/17/2014, 11:02 AM [facility] [phone] or if no answer [phone]

## 2014-12-18 ENCOUNTER — Inpatient Hospital Stay (HOSPITAL_COMMUNITY): Payer: Medicare Other

## 2014-12-18 DIAGNOSIS — G0490 Encephalitis and encephalomyelitis, unspecified: Secondary | ICD-10-CM

## 2014-12-18 LAB — GLUCOSE, CAPILLARY
Glucose-Capillary: 138 mg/dL — ABNORMAL HIGH (ref 70–99)
Glucose-Capillary: 159 mg/dL — ABNORMAL HIGH (ref 70–99)
Glucose-Capillary: 171 mg/dL — ABNORMAL HIGH (ref 70–99)
Glucose-Capillary: 179 mg/dL — ABNORMAL HIGH (ref 70–99)
Glucose-Capillary: 200 mg/dL — ABNORMAL HIGH (ref 70–99)
Glucose-Capillary: 222 mg/dL — ABNORMAL HIGH (ref 70–99)

## 2014-12-18 LAB — MISCELLANEOUS TEST

## 2014-12-18 MED ORDER — FENTANYL CITRATE 0.05 MG/ML IJ SOLN
200.0000 ug | Freq: Once | INTRAMUSCULAR | Status: AC
  Administered 2014-12-19: 200 ug via INTRAVENOUS
  Filled 2014-12-18: qty 4

## 2014-12-18 MED ORDER — ETOMIDATE 2 MG/ML IV SOLN
40.0000 mg | Freq: Once | INTRAVENOUS | Status: DC
  Filled 2014-12-18: qty 20

## 2014-12-18 MED ORDER — MIDAZOLAM HCL 2 MG/2ML IJ SOLN: 4.0000 mg | Freq: Once | INTRAMUSCULAR | Status: DC

## 2014-12-18 MED ORDER — VECURONIUM BROMIDE 10 MG IV SOLR: 10.0000 mg | Freq: Once | INTRAVENOUS | Status: DC

## 2014-12-18 MED ORDER — PROPOFOL 10 MG/ML IV EMUL
5.0000 ug/kg/min | INTRAVENOUS | Status: DC
  Administered 2014-12-19: 30 ug/kg/min via INTRAVENOUS
  Filled 2014-12-18: qty 100

## 2014-12-18 MED ORDER — MIDAZOLAM HCL 2 MG/2ML IJ SOLN
4.0000 mg | Freq: Once | INTRAMUSCULAR | Status: AC
  Administered 2014-12-19: 2 mg via INTRAVENOUS
  Filled 2014-12-18: qty 4

## 2014-12-18 MED ORDER — VECURONIUM BROMIDE 10 MG IV SOLR
10.0000 mg | Freq: Once | INTRAVENOUS | Status: AC
  Administered 2014-12-19: 7 mg via INTRAVENOUS
  Filled 2014-12-18: qty 10

## 2014-12-18 MED ORDER — FENTANYL CITRATE 0.05 MG/ML IJ SOLN: 200.0000 ug | Freq: Once | INTRAMUSCULAR | Status: DC

## 2014-12-18 NOTE — Progress Notes (Signed)
Subjective: Patient has not shown consistent purposeful responses. No overnight adverse events reported.  Objective: Current vital signs: BP 121/50 mmHg  Pulse 78  Temp(Src) 99.1 F (37.3 C) (Oral)  Resp 34  Ht 5\' 4"  (1.626 m)  Wt 103.2 kg (227 lb 8.2 oz)  BMI 39.03 kg/m2  SpO2 98%  Neurologic Exam: Patient remained intubated and on mechanical ventilation. She is currently on no sedating medication. Patient responds to verbal stimulation with eye opening but does not track, nor does she blink to visual threat. Pupils were equal and reacted normally to light. Extraocular movements were intact with oculocephalic maneuvers. Face was symmetrical with no signs of focal weakness. Muscle tone was flaccid throughout. There was no abnormal posturing of extremities. Withdrawal movements to noxious stimuli were symmetrical. Deep tendon reflexes were absent throughout area Plantar responses were extensor bilaterally.  Medications: I have reviewed the patient's current medications.  Anti-NMDAR and anti-AMPA results are still pending.  Assessment/Plan: Encephalopathy of unclear etiology, with severe diffuse impairment, with no signs of improvement since admission. CSF findings were indicative of an acute inflammatory process. MRI is demonstrated abnormalities involving the limbic system, affecting hippocampi primarily. Prognosis is likely poor for recovery to independent functioning.  We will continue to follow this patient with you.  C.R. Roseanne Reno, MD [facility] [phone]  12/18/2014  6:18 PM

## 2014-12-18 NOTE — Progress Notes (Signed)
CARE MANAGEMENT NOTE 12/18/2014  Patient:  NAUSHEEN, VENECIA   Account Number:  [id number]  Date Initiated:  12/05/2014  Documentation initiated by:  Hach,RHONDA  Subjective/Objective Assessment:   59 year old F brought to the [facility] ED on 12/04/2014 after being found down at home by daughter.  Last seen normal 1 day prior. Required intubation in the emergency department for airway protection     Action/Plan:   tbd   Anticipated DC Date:  12/21/2014   Anticipated DC Plan:  SKILLED NURSING FACILITY  In-house referral  Clinical Social Worker      DC Planning Services  CM consult      [facility] Choice  NA   Choice offered to / List presented to:  NA   DME arranged  NA           Status of service:  In process, will continue to follow Medicare Important Message given?   (If response is "NO", the following Medicare IM given date fields will be blank) Date Medicare IM given:   Medicare IM given by:   Date Additional Medicare IM given:   Additional Medicare IM given by:    Discharge Disposition:    Per UR Regulation:  Reviewed for med. necessity/level of care/duration of stay  If discussed at Long Length of Stay Meetings, dates discussed:   12/12/2014  12/14/2014    Comments:  [id number] Earlene Plater RN, BSN, CCN: [phone] Case management. Chart reviewed for discharge planning and present needs. Discharge needs: none present at time of review. Continues to requre full vent support Fi02 at 30% and sedated.  December 14, 2014/Rhonda L. Earlene Plater, RN, BSN, CCM. Case Management [facility] [phone] No discharge needs present of time of review. Remains on vent, failed weaning.  December 11, 2014/Rhonda L. Earlene Plater, RN, BSN, CCM. Case Management [facility] [phone] No discharge needs present of time of review. Intaubated, off iv sedation but does not respond to stimuli.  Nerology in.  Poss bacterial menegitis. wbc elevated/ bld cultures are negative for  growth/chem 12 panel abnormal, na 148, k+2.9  December 08, 2014/Rhonda L. Earlene Plater, RN, BSN, CCM. Case Management [facility] [phone] No discharge needs present of time of review. remains intubated, responsvie not sedated.  eeg pending  December 05, 2014/Rhonda L. Earlene Plater, RN, BSN, CCM. Case Management [facility] [phone] No discharge needs present of time of review.

## 2014-12-18 NOTE — Progress Notes (Signed)
NUTRITION FOLLOW UP  Intervention:   Continue Vital High Protein via OGT goal rate of 40 ml/hr.   30 ml Pro-stat BID  Current TF regimen provides 1250 kcal (13 g/kg actual body weight), 114 g protein and 803 ml free water  Nutrition Dx:   Inadequate oral intake related to inability to eat as evidenced by NPO status; ongoing.  Goal:   Enteral nutrition to provide 60-70% of estimated caloric needs (22-25 g/kg of ideal body weight) and 100% of estimated protein needs per ASPEN guideline of hypo caloric high protein feeding of critically ill obese individuals; met with TF  Monitor:   TF initiation/tolerance/adequacy, weight trends, labs, I/Os  Assessment:   Pt found unresponsive, brought to ED and required intubation. PMH significant for HTN, DM, polysubstance abuse.   Patient is currently intubated on ventilator support MV: 6.7 L/min Temp (24hrs), Avg:99 F (37.2 C), Min:98.6 F (37 C), Max:99.8 F (37.7 C)  Propofol: none  No signs of fat or muscle wasting  Pt tolerating TF at goal rate  Pt with flexiseal and liquid bowel movements.   Pt in + fluid balance, admission weight 215 lbs current weight 227 lbs  Labs reviewed  CBGs 159-222  Height: Ht Readings from Last 1 Encounters:  12/11/14 _0  (1.626 m)    Weight Status:   Wt Readings from Last 1 Encounters:  12/18/14 227 lb 8.2 oz (103.2 kg)    Re-estimated needs:  Kcal: 4481-8563 (11-14 kcal/kg actual body weight) Protein: >/= 110 g protein Fluid: >/= 1.6 L * pt in + fluid balance, will use admission weight of 215 lbs  Skin: intact  Diet Order:     Intake/Output Summary (Last 24 hours) at 12/18/14 1340 Last data filed at 12/18/14 1300  Gross per 24 hour  Intake   3545 ml  Output   4570 ml  Net  -1025 ml    Last BM: 4/4- 250 mL   Labs:   Recent Labs Lab 12/12/14 1844  12/15/14 0430 12/16/14 0430 12/17/14 0412  NA 153*  < > 153* 148* 140  K 4.6  < > 4.0 3.7 3.9  CL 112  < > 114* 104  102  CO2 34*  < > 32 30 29  BUN 86*  < > 53* 43* 43*  CREATININE 1.85*  < > 1.27* 1.21* 1.19*  CALCIUM 8.6  < > 8.8 8.3* 8.2*  MG 2.1  --  1.7  --   --   PHOS 2.9  --  3.5  --   --   GLUCOSE 193*  < > 207* 230* 247*  < > = values in this interval not displayed.  CBG (last 3)   Recent Labs  12/18/14 0322 12/18/14 0815 12/18/14 1315  GLUCAP 200* 159* 222*    Scheduled Meds: . amLODipine  10 mg Per Tube Daily  . antiseptic oral rinse  7 mL Mouth Rinse QID  . chlorhexidine  15 mL Mouth Rinse BID  . feeding supplement (PRO-STAT SUGAR FREE 64)  30 mL Oral BID  . feeding supplement (VITAL HIGH PROTEIN)  1,000 mL Per Tube Q24H  . folic acid  1 mg Per Tube Daily  . free water  250 mL Per Tube Q4H  . insulin aspart  0-20 Units Subcutaneous 6 times per day  . insulin glargine  8 Units Subcutaneous BID  . lactulose  20 g Per Tube Daily  . metoprolol tartrate  100 mg Per Tube BID  . pantoprazole  sodium  40 mg Per Tube Q24H  . thiamine  100 mg Per Tube Daily    Continuous Infusions: . dextrose 10 mL (12/18/14 1118)    Laurette Schimke [place], [place], [place]

## 2014-12-18 NOTE — Progress Notes (Signed)
Inpatient Diabetes Program Recommendations  AACE/ADA: New Consensus Statement on Inpatient Glycemic Control (2013)  Target Ranges:  Prepandial:   less than 140 mg/dL      Peak postprandial:   less than 180 mg/dL (1-2 hours)      Critically ill patients:  140 - 180 mg/dL     Results for VIVIKA, WITTER (MRN [id number]) as of 12/18/2014 08:40  Ref. Range 12/17/2014 00:01 12/17/2014 05:10 12/17/2014 08:23 12/17/2014 12:55 12/17/2014 16:27 12/17/2014 20:33  Glucose-Capillary Latest Range: 70-99 mg/dL 149 (H) 702 (H) 637 (H) 184 (H) 220 (H) 164 (H)    Results for JOHNNAY, VARNADOE (MRN [id number]) as of 12/18/2014 08:40  Ref. Range 12/17/2014 23:42 12/18/2014 03:22  Glucose-Capillary Latest Range: 70-99 mg/dL 412 (H) 878 (H)     Current Orders: Lantus 8 units bid      Novolog Resistant SSI Q4 hours    **Patient currently receiving Vital HP Tube Feeds at 40 cc/hour.  **Having elevated glucose levels.  **Patient required 30 units total of Novolog SSI yesterday (04/03).    MD- Please consider adding Novolog Tube Feed coverage-  Novolog 3 units Q4 hours  (hold if pt NPO, hold if tube feeds held for any reason)    Will follow Ambrose Finland RN, MSN, CDE Diabetes Coordinator Inpatient Diabetes Program Team Pager: [phone] (8a-5p)

## 2014-12-18 NOTE — Progress Notes (Signed)
Called daughter Venita Sheffield and discussed patients current status.  Essentially no change in neurological exam.  Previously had discussed trach / PEG option with daughter and niece on 3/30.  They would like to proceed with placement of trach / PEG to allow for more time to determine if patient will have cognitive improvement.  In the event of cardiac arrest, daughter would not want CPR but continue all efforts up to that point.     Plan: DNR Proceed with Janina Mayo / PEG Await pending neuro studies (anti-NMDAR, anti-AMPA)  Canary Brim, NP-C [facility] Pgr: [phone] or [phone]  Levy Pupa, MD, PhD 12/18/2014, 3:39 PM [facility] [phone] or if no answer [phone]

## 2014-12-18 NOTE — Progress Notes (Signed)
PULMONARY / CRITICAL CARE MEDICINE   Name: LASHE COYE MRN: [id number] DOB: [birth year]    ADMISSION DATE:  12/04/2014  REFERRING MD :  Madilyn Hook EDP  CHIEF COMPLAINT:  Found down  INITIAL PRESENTATION: 59 y/o female found unresponsive by family at home.  CBG 54 in ER, and intubated for airway protection >> Difficult airway.  STUDIES:  3/21 CT head >> no acute intracranial process 3/22 MRI Brain >> hippocampal diffusion signal abnormal to the posterior singulate gyrus  3/22 EEG >> no seizure activity 3/22 LP >> glucose 96, protein 42, WBC 128 (82%N), RBC 13 3/25 EEG >> toxic/metabolic encephalopathy.  No seizure activity 3/28 MRI Brain >> stable, abnormal brain MR with hyperintensity in limbic system affecting the hippocampus  SIGNIFICANT EVENTS: 3/21  Admit 3/22  Neurology consulted 3/24  ID consulted 3/26  acyclovir, ampicillin, decadron d/c'ed 4/01  Pt with new but minimal movement of fingers / toes on command  SUBJECTIVE:   No changes reported On PSV 5 this am and tolerating  VITAL SIGNS: Temp:  [98.6 F (37 C)-99.8 F (37.7 C)] 98.8 F (37.1 C) (04/04 0800) Pulse Rate:  [80-100] 81 (04/04 1004) Resp:  [13-45] 24 (04/04 1004) BP: (88-176)/(27-120) 136/74 mmHg (04/04 1004) SpO2:  [95 %-100 %] 99 % (04/04 1004) FiO2 (%):  [30 %] 30 % (04/04 1000) Weight:  [103.2 kg (227 lb 8.2 oz)] 103.2 kg (227 lb 8.2 oz) (04/04 0349)   VENTILATOR SETTINGS: Vent Mode:  [-] PSV;CPAP FiO2 (%):  [30 %] 30 % Set Rate:  [14 bmp] 14 bmp Vt Set:  [440 mL] 440 mL PEEP:  [5 cmH20] 5 cmH20 Pressure Support:  [5 cmH20] 5 cmH20 Plateau Pressure:  [15 cmH20-18 cmH20] 18 cmH20   INTAKE / OUTPUT:  Intake/Output Summary (Last 24 hours) at 12/18/14 1026 Last data filed at 12/18/14 1000  Gross per 24 hour  Intake   3425 ml  Output   4420 ml  Net   -995 ml   PHYSICAL EXAMINATION: General: no distress Neuro: Opened eyes immediately to voice.  She MAY have moved her L arm for me 4/3 to  request (asked her to wiggle fingers). Did not move or follow any commands on 4/4.  HEENT: ETT in place, mm pink moist Cardiovascular: regular Lungs: even/non-labored, scattered rhonchi Abdomen: soft, non tender Musculoskeletal: 1+ pretibial edema Skin: no rashes  LABS:  CBC  Recent Labs Lab 12/14/14 0500 12/15/14 0430 12/16/14 0430  WBC 12.1* 11.8* 12.0*  HGB 7.6* 7.4* 7.5*  HCT 24.8* 23.8* 23.2*  PLT 186 207 190    BMET  Recent Labs Lab 12/15/14 0430 12/16/14 0430 12/17/14 0412  NA 153* 148* 140  K 4.0 3.7 3.9  CL 114* 104 102  CO2 32 30 29  BUN 53* 43* 43*  CREATININE 1.27* 1.21* 1.19*  GLUCOSE 207* 230* 247*   Electrolytes  Recent Labs Lab 12/12/14 1844  12/15/14 0430 12/16/14 0430 12/17/14 0412  CALCIUM 8.6  < > 8.8 8.3* 8.2*  MG 2.1  --  1.7  --   --   PHOS 2.9  --  3.5  --   --   < > = values in this interval not displayed.    ABG  Recent Labs Lab 12/12/14 1728  PHART 7.467*  PCO2ART 46.4*  PO2ART 113.0*   Liver Enzymes  Recent Labs Lab 12/13/14 0400  AST 120*  ALT 157*  ALKPHOS 65  BILITOT 0.2*  ALBUMIN 2.1*   Glucose  Recent  Labs Lab 12/17/14 1255 12/17/14 1627 12/17/14 2033 12/17/14 2342 12/18/14 0322 12/18/14 0815  GLUCAP 184* 220* 164* 171* 200* 159*   Imaging No results found. 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 Likely diabetic / hypoglycemic encephalopathy with AKI - Ehrlichia panel negative - RMSF negative - ANA and ANCA negative - CSF HSV negative Hx of ETOH, cocaine abuse. P:   Monitor mental status RASS goal 0 F/u anti-NMDAR, anti-AMPA > still pending Continue lactulose Thiamine, folic acid Passive ROM  Suspect poor prognosis for recovery here. Will need to address possible withdrawal of care if no improvement. Also considering trach given unclear etiology, unclear  prognosis  PULMONARY OETT 3/21 >>> A:  Acute respiratory failure secondary to inability to protect airway >> difficult airway. Tobacco abuse. P:   Pressure support wean as tolerated >> mental status prevents extubation  F/u CXR intermittently  Suspect she will undergo tracheostomy  CARDIOVASCULAR A: Hx HTN, HLD. P:  Continue norvasc 10 mg  Metoprolol 100 mg bid  Hold outpt lisinopril, simvastatin, nebivolol  RENAL A:  AKI - creatinine plateau, now improving .  FENa 0.67 %, suggestive of pre-renal failure Gap/non gap metabolic acidosis >> improved 1/61. Hypokalemia. Hypernatremia P:   Renal dose adjust meds D5w > KVO on 4/4 Replace electrolytes as needed.   F/u BMET Free water 250 ml Q4 Defer diuresis for now  GASTROINTESTINAL A:  Nutrition. Diarrhea >> C diff negative 3/24. P:   TF on vent Protonix  HEMATOLOGIC A:  Anemia of critical illness. P:  F/u CBC SQ heparin for DVT prevention  INFECTIOUS A:   Possible meningitis. P:   Rocephin, vancomycin for 14 days total, should end 4/4  Blood 3/21 >> neg  ENDOCRINE A:  DM with renal complications. P:   SSI Changed lantus to 8 units bid on 3/29 Hold outpt pioglitazone, metformin   Pursue trach this week, presuming that daughter and family agree with extending her current support.    Levy Pupa, MD, PhD 12/18/2014, 10:26 AM [facility] [phone] or if no answer [phone]

## 2014-12-19 ENCOUNTER — Inpatient Hospital Stay (HOSPITAL_COMMUNITY): Payer: Medicare Other

## 2014-12-19 DIAGNOSIS — R400 Somnolence: Secondary | ICD-10-CM

## 2014-12-19 DIAGNOSIS — R4189 Other symptoms and signs involving cognitive functions and awareness: Secondary | ICD-10-CM | POA: Insufficient documentation

## 2014-12-19 DIAGNOSIS — J9600 Acute respiratory failure, unspecified whether with hypoxia or hypercapnia: Secondary | ICD-10-CM

## 2014-12-19 DIAGNOSIS — R404 Transient alteration of awareness: Secondary | ICD-10-CM

## 2014-12-19 DIAGNOSIS — R1310 Dysphagia, unspecified: Secondary | ICD-10-CM | POA: Insufficient documentation

## 2014-12-19 LAB — CBC
HCT: 21 % — ABNORMAL LOW (ref 36.0–46.0)
HEMOGLOBIN: 6.6 g/dL — AB (ref 12.0–15.0)
MCH: 30.7 pg (ref 26.0–34.0)
MCHC: 31.4 g/dL (ref 30.0–36.0)
MCV: 97.7 fL (ref 78.0–100.0)
Platelets: 172 10*3/uL (ref 150–400)
RBC: 2.15 MIL/uL — AB (ref 3.87–5.11)
RDW: 14.7 % (ref 11.5–15.5)
WBC: 9.7 10*3/uL (ref 4.0–10.5)

## 2014-12-19 LAB — BASIC METABOLIC PANEL
Anion gap: 8 (ref 5–15)
BUN: 37 mg/dL — ABNORMAL HIGH (ref 6–23)
CALCIUM: 8.6 mg/dL (ref 8.4–10.5)
CHLORIDE: 103 mmol/L (ref 96–112)
CO2: 31 mmol/L (ref 19–32)
Creatinine, Ser: 1.11 mg/dL — ABNORMAL HIGH (ref 0.50–1.10)
GFR calc Af Amer: 61 mL/min — ABNORMAL LOW (ref >90)
GFR calc non Af Amer: 52 mL/min — ABNORMAL LOW (ref >90)
GLUCOSE: 151 mg/dL — AB (ref 70–99)
Potassium: 3.8 mmol/L (ref 3.5–5.1)
Sodium: 142 mmol/L (ref 135–145)

## 2014-12-19 LAB — PROTIME-INR
INR: 1.06 (ref 0.00–1.49)
Prothrombin Time: 13.9 seconds (ref 11.6–15.2)

## 2014-12-19 LAB — GLUCOSE, CAPILLARY
GLUCOSE-CAPILLARY: 166 mg/dL — AB (ref 70–99)
GLUCOSE-CAPILLARY: 199 mg/dL — AB (ref 70–99)
Glucose-Capillary: 187 mg/dL — ABNORMAL HIGH (ref 70–99)
Glucose-Capillary: 128 mg/dL — ABNORMAL HIGH (ref 70–99)
Glucose-Capillary: 103 mg/dL — ABNORMAL HIGH (ref 70–99)
Glucose-Capillary: 152 mg/dL — ABNORMAL HIGH (ref 70–99)

## 2014-12-19 LAB — ABO/RH: ABO/RH(D): O POS

## 2014-12-19 LAB — HEMOGLOBIN AND HEMATOCRIT, BLOOD
HEMATOCRIT: 23.9 % — AB (ref 36.0–46.0)
HEMOGLOBIN: 7.6 g/dL — AB (ref 12.0–15.0)

## 2014-12-19 LAB — APTT: aPTT: 37 seconds (ref 24–37)

## 2014-12-19 LAB — PREPARE RBC (CROSSMATCH)

## 2014-12-19 MED ORDER — SODIUM CHLORIDE 0.9 % IV SOLN
Freq: Once | INTRAVENOUS | Status: AC
  Administered 2014-12-19: 13:00:00 via INTRAVENOUS

## 2014-12-19 MED ORDER — FUROSEMIDE 10 MG/ML IJ SOLN
40.0000 mg | Freq: Once | INTRAMUSCULAR | Status: AC
  Administered 2014-12-19: 40 mg via INTRAVENOUS
  Filled 2014-12-19: qty 4

## 2014-12-19 MED ORDER — FENTANYL CITRATE 0.05 MG/ML IJ SOLN
25.0000 ug | INTRAMUSCULAR | Status: AC | PRN
  Administered 2014-12-19 – 2014-12-20 (×6): 50 ug via INTRAVENOUS
  Filled 2014-12-19 (×6): qty 2

## 2014-12-19 MED ORDER — POTASSIUM CHLORIDE 20 MEQ/15ML (10%) PO SOLN
40.0000 meq | Freq: Once | ORAL | Status: AC
  Administered 2014-12-19: 40 meq
  Filled 2014-12-19: qty 30

## 2014-12-19 NOTE — Progress Notes (Signed)
CRITICAL VALUE ALERT  Critical value received:  Hgb 6.6  Date of notification:  12/19/2014  Time of notification:  0550  Critical value read back:Yes.    Nurse who received alert:  Verl Blalock, RN  MD notified (1st page):  Pola Corn- Deterding  Time of first page:  [phone]  MD notified (2nd page):  Time of second page:  Responding MD:  ELink-Deterding  Time MD responded:  [phone]

## 2014-12-19 NOTE — Progress Notes (Signed)
PULMONARY / CRITICAL CARE MEDICINE   Name: Gina Cox MRN: [id number] DOB: 02/26/1956    ADMISSION DATE:  12/04/2014  REFERRING MD :  Madilyn Hook EDP  CHIEF COMPLAINT:  Found down  INITIAL PRESENTATION: 59 y/o female found unresponsive by family at home.  CBG 54 in ER, and intubated for airway protection >> Difficult airway.  STUDIES:  3/21 CT head >> no acute intracranial process 3/22 MRI Brain >> hippocampal diffusion signal abnormal to the posterior singulate gyrus  3/22 EEG >> no seizure activity 3/22 LP >> glucose 96, protein 42, WBC 128 (82%N), RBC 13 3/25 EEG >> toxic/metabolic encephalopathy.  No seizure activity 3/28 MRI Brain >> stable, abnormal brain MR with hyperintensity in limbic system affecting the hippocampus  SIGNIFICANT EVENTS: 3/21  Admit 3/22  Neurology consulted 3/24  ID consulted 3/26  acyclovir, ampicillin, decadron d/c'ed 4/01  Pt with new but minimal movement of fingers / toes on command 4/04  Tolerated PSV 5/5  SUBJECTIVE:  No acute events, pending trach this pm.  Hgb dropped to 6.6  VITAL SIGNS: Temp:  [98.6 F (37 C)-99.4 F (37.4 C)] 98.6 F (37 C) (04/05 0800) Pulse Rate:  [74-89] 87 (04/05 0442) Resp:  [14-34] 14 (04/05 0900) BP: (121-166)/(50-120) 166/73 mmHg (04/05 0800) SpO2:  [97 %-100 %] 100 % (04/05 0900) FiO2 (%):  [30 %] 30 % (04/05 0442) Weight:  [225 lb 15.5 oz (102.5 kg)] 225 lb 15.5 oz (102.5 kg) (04/05 0400)   VENTILATOR SETTINGS: Vent Mode:  [-] PRVC FiO2 (%):  [30 %] 30 % Set Rate:  [14 bmp] 14 bmp Vt Set:  [440 mL] 440 mL PEEP:  [5 cmH20] 5 cmH20 Pressure Support:  [5 cmH20-10 cmH20] 10 cmH20 Plateau Pressure:  [15 cmH20-17 cmH20] 17 cmH20   INTAKE / OUTPUT:  Intake/Output Summary (Last 24 hours) at 12/19/14 0959 Last data filed at 12/19/14 0900  Gross per 24 hour  Intake   2880 ml  Output   4450 ml  Net  -1570 ml   PHYSICAL EXAMINATION: General: no distress Neuro: Opened eyes immediately to voice.  No follow  commands, no spontaneous movement HEENT: ETT in place, mm pink moist Cardiovascular: regular Lungs: even/non-labored, scattered rhonchi Abdomen: soft, non tender Musculoskeletal: 1+ pretibial edema Skin: no rashes  LABS:  CBC  Recent Labs Lab 12/15/14 0430 12/16/14 0430 12/19/14 0520  WBC 11.8* 12.0* 9.7  HGB 7.4* 7.5* 6.6*  HCT 23.8* 23.2* 21.0*  PLT 207 190 172    BMET  Recent Labs Lab 12/16/14 0430 12/17/14 0412 12/19/14 0520  NA 148* 140 142  K 3.7 3.9 3.8  CL 104 102 103  CO2 BUN 43* 43* 37*  CREATININE 1.21* 1.19* 1.11*  GLUCOSE 230* 247* 151*   Electrolytes  Recent Labs Lab 12/12/14 1844  12/15/14 0430 12/16/14 0430 12/17/14 0412 12/19/14 0520  CALCIUM 8.6  < > 8.8 8.3* 8.2* 8.6  MG 2.1  --  1.7  --   --   --   PHOS 2.9  --  3.5  --   --   --   < > = values in this interval not displayed.    ABG  Recent Labs Lab 12/12/14 1728  PHART 7.467*  PCO2ART 46.4*  PO2ART 113.0*   Liver Enzymes  Recent Labs Lab 12/13/14 0400  AST 120*  ALT 157*  ALKPHOS 65  BILITOT 0.2*  ALBUMIN 2.1*   Glucose  Recent Labs Lab 12/18/14 1315 12/18/14 1552  12/18/14 2001 12/18/14 2334 12/19/14 0309 12/19/14 0739  GLUCAP 222* 179* 138* 187* 166* 128*   Imaging Dg Chest Port 1 View  12/18/2014   CLINICAL DATA:  Acute respiratory failure, subsequent encounter.  EXAM: PORTABLE CHEST - 1 VIEW  COMPARISON:  12/16/2014.  FINDINGS: Endotracheal tube terminates 3.8 cm above the carina. Nasogastric tube is followed into the stomach. Left IJ central line tip projects over the SVC. There may be calcified bi hilar lymph nodes. Linear subsegmental atelectasis in the lingula and medial left lower lobe. Lungs are otherwise clear. No pleural fluid.  IMPRESSION: No acute findings.   Electronically Signed   By: Leanna Battles M.D.   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  Likely diabetic / hypoglycemic encephalopathy with AKI - Ehrlichia panel negative - RMSF negative - ANA and ANCA negative - CSF HSV negative Hx of ETOH, cocaine abuse. P:   Monitor mental status RASS goal 0 F/u anti-NMDAR, anti-AMPA > still pending Continue lactulose Thiamine, folic acid Passive ROM  Suspect poor prognosis for recovery here. Likely poor prognosis but family wants to proceed with trach to allow for more time to determine if she will have recovery  PULMONARY OETT 3/21 >>> A:   Acute respiratory failure secondary to inability to protect airway >> difficult airway. Tobacco abuse. P:   Pressure support wean as tolerated >> mental status prevents extubation  F/u CXR intermittently  Pending trach placement 4/5   CARDIOVASCULAR A: Hx HTN, HLD. P:  Continue norvasc 10 mg  Metoprolol 100 mg bid  Hold outpt lisinopril, simvastatin, nebivolol  RENAL A:  AKI - creatinine plateau, now improving .  FENa 0.67 %, suggestive of pre-renal failure Gap/non gap metabolic acidosis >> improved 1/61. Hypokalemia. Hypernatremia P:   Renal dose adjust meds D5w > KVO on 4/4 Replace electrolytes as needed.   F/u BMET Free water 250 ml Q4 Lasix 40 x1 4/5 + KCL   GASTROINTESTINAL A:  Nutrition. Diarrhea >> C diff negative 3/24. P:   TF while on vent Protonix  HEMATOLOGIC A:  Anemia of critical illness. P:  F/u CBC SQ heparin for DVT prevention PRBC x1 4/5  INFECTIOUS A:   Possible meningitis. P:   Rocephin, vancomycin for 14 days total, should end 4/4  Blood 3/21 >> neg  ENDOCRINE A:  DM with renal complications. P:   SSI Changed lantus to 8 units bid on 3/29 Hold outpt pioglitazone, metformin   Trach this afternoon.  Will need ongoing discussions with daughter regarding goals of care.     Canary Brim, NP-C [facility] Pgr: [phone] or [phone]   Attending  Note:  I have examined patient, reviewed labs, studies and notes. I have discussed the case with B Ollis, and I agree with the data and plans as amended above.   Levy Pupa, MD, PhD 12/19/2014, 12:29 PM [facility] [phone] or if no answer [phone]

## 2014-12-19 NOTE — Procedures (Signed)
Bedside Tracheostomy Insertion Procedure Note   Patient Details:   Name: Gina Cox DOB: [birth year] MRN: [id number]  Procedure: Tracheostomy  Pre Procedure Assessment: ET Tube Size: 7.5 ET Tube secured at lip (cm): 24 Bite block in place: No Breath Sounds: Clear  Post Procedure Assessment: BP 115/42 mmHg  Pulse 84  Temp(Src) 98.6 F (37 C) (Oral)  Resp 20  Ht 5\' 4"  (1.626 m)  Wt 225 lb 15.5 oz (102.5 kg)  BMI 38.77 kg/m2  SpO2 100% O2 sats: stable throughout Complications: No apparent complications Patient did tolerate procedure well Tracheostomy Brand:Shiley Tracheostomy Style:Cuffed Tracheostomy Size: 6 Tracheostomy Secured OEH:[id number], velcro Tracheostomy Placement Confirmation:Trach cuff visualized and in place and Chest X ray ordered for placement    Jacqulynn Cadet 12/19/2014, 3:03 PM

## 2014-12-19 NOTE — Procedures (Signed)
Bronchoscopy Procedure Note Gina Cox [id number] 1955-11-21  Procedure: Bronchoscopy Indications: Tracheostomy Placement  Procedure Details Consent: Risks of procedure as well as the alternatives and risks of each were explained to the (patient/caregiver).  Consent for procedure obtained. Time Out: Verified patient identification, verified procedure, site/side was marked, verified correct patient position, special equipment/implants available, medications/allergies/relevent history reviewed, required imaging and test results available.  Performed  In preparation for procedure, patient was given 100% FiO2, bronchoscope lubricated and sedated / paralyzed for procedure. Sedation: Benzodiazepines and Fentanyl, propofol, vecruronium   Airway entered and the following bronchi were examined: upper airway  Procedures performed: Brushings performed - no Bronchoscope removed.    Evaluation Hemodynamic Status: BP stable throughout; O2 sats: stable throughout Patient's Current Condition: stable Specimens:  Sent BAL for HSV, GS & Cutlure Complications: No apparent complications Patient did tolerate procedure well.    Procedure performed under direct supervision of Dr. Harrell Gave, NP-C [facility] Pgr: [phone] or [phone]       12/19/2014  I placed scope into ETT , backed up ETT direct visualization to 17 cm Never lost airway Scope visualized trach Then placed bronch through new trach , no bleeding, well placed  Then placed scope through old ETT to get to peri trach area where we noted ulcerations, concern HSV BAL performed and washed back , will send for hsv Tolerated well  Mcarthur Rossetti. Tyson Alias, MD, FACP Pgr: [phone] [facility]

## 2014-12-19 NOTE — Progress Notes (Signed)
Subjective: No improvement and level of unresponsiveness reported. No overnight adverse events reported as well.  Objective: Current vital signs: BP 167/71 mmHg  Pulse 84  Temp(Src) 99.3 F (37.4 C) (Oral)  Resp 14  Ht 5\' 4"  (1.626 m)  Wt 102.5 kg (225 lb 15.5 oz)  BMI 38.77 kg/m2  SpO2 100%  Neurologic Exam: Patient remains intubated and on mechanical ventilation.  Patient responds to verbal stimulation with eye opening but does not track, nor does she blink to visual threat. Pupils were equal and reacted normally to light. Extraocular movements were intact with oculocephalic maneuvers. Face was symmetrical. Muscle tone was flaccid throughout. Mild decerebrate posturing was noted with noxious stimuli.  Deep tendon reflexes were absent throughout area Plantar responses were extensor bilaterally.  Medications: I have reviewed the patient's current medications.  Laboratory studies: Encephalitis panel, including anti-NMDA and anti-AMPA were negative.  Assessment/Plan: Etiology for encephalopathy remains unclear, but is probably multifactorial, including hypoglycemia, hypoxemia and encephalitis as evidenced by CSF and MRI abnormalities. Prognosis for recovery is poor.  No changes in current management recommended. Agree with family's decision for DO NOT RESUSCITATE status.  We will continue to follow this patient with you.  C.R. Roseanne Reno, MD [facility] [phone]  12/19/2014  1:37 PM

## 2014-12-19 NOTE — Consult Note (Signed)
Reason for consult: Percutaneous gastrostomy tube placement  Referring Physician(s): CCM  History of Present Illness: Gina Cox is a 59 y.o. female with past medical history significant for hypertension, diabetes and polysubstance abuse who was brought to the [facility] on 3/21 after being found down unresponsive at home by her daughter. Patient was felt to have incurred acute diabetic encephalopathy with subsequent respiratory failure and acute kidney injury as well as possible viral encephalitis. Blood and CSF cultures were negative. Secondary to dysphagia and need for prolonged ventilatory support request is now made for percutaneous gastrostomy tube placement. Patient currently has Panda tube in place and is scheduled for tracheostomy placement today. She is being transfused secondary to hemoglobin of 6.6.  Past Medical History  Diagnosis Date  . Diabetes mellitus   . Hypertension   . Hyperlipemia   . Gout   . Obesity     History reviewed. No pertinent past surgical history.  Allergies: Review of patient's allergies indicates no known allergies.  Medications: Prior to Admission medications   Medication Sig Start Date End Date Taking? Authorizing Provider  amLODipine (NORVASC) 5 MG tablet Take 5 mg by mouth daily.   Yes Historical Provider, MD  insulin glargine (LANTUS) 100 UNIT/ML injection Inject 45 Units into the skin at bedtime.    Yes Historical Provider, MD  lisinopril (PRINIVIL,ZESTRIL) 40 MG tablet Take 40 mg by mouth daily.   Yes Historical Provider, MD  pioglitazone-metformin (ACTOPLUS MET) 15-500 MG per tablet Take 2 tablets by mouth daily.    Yes Historical Provider, MD  simvastatin (ZOCOR) 20 MG tablet Take 20 mg by mouth daily.   Yes Historical Provider, MD  fluorometholone (FML) 0.1 % ophthalmic suspension 1 drop 4 (four) times daily. In Surgical eye 11/03/14   Historical Provider, MD  ketorolac (ACULAR) 0.4 % SOLN 1 drop 4 (four) times daily. On  Tuesday, Wednesday, and thursday before surgery 11/03/14   Historical Provider, MD  nebivolol (BYSTOLIC) 5 MG tablet Take 5 mg by mouth daily.    Historical Provider, MD  ofloxacin (OCUFLOX) 0.3 % ophthalmic solution 1 drop 4 (four) times daily. Into surgical eye 11/03/14   Historical Provider, MD     History reviewed. No pertinent family history.  History   Social History  . Marital Status: Single    Spouse Name: N/A  . Number of Children: N/A  . Years of Education: N/A   Social History Main Topics  . Smoking status: Current Every Day Smoker -- 2.00 packs/day    Types: Cigarettes  . Smokeless tobacco: Not on file  . Alcohol Use: Yes     Comment: monthly  . Drug Use: Yes    Special: Cocaine, Marijuana  . Sexual Activity: Not on file   Other Topics Concern  . None   Social History Narrative        Review of Systems unable to be obtained secondary to intubation, inability to follow commands  Vital Signs: BP 131/58 mmHg  Pulse 84  Temp(Src) 98.6 F (37 C) (Oral)  Resp 15  Ht _0  (1.626 m)  Wt 225 lb 15.5 oz (102.5 kg)  BMI 38.77 kg/m2  SpO2 100%  Physical Exam patient opens eyes to voice and stimulation; not following commands; chest with scattered rhonchi; heart with regular rate and rhythm; abdomen soft, positive bowel sounds, nontender; extremities with 1+ pretibial edema bilaterally.  Mallampati Score:     Imaging: Dg Abd 1 View  12/05/2014  CLINICAL DATA:  Check gastric catheter placement  EXAM: ABDOMEN - 1 VIEW  COMPARISON:  None.  FINDINGS: A gastric catheter is noted within the stomach with the tip directed towards the pyloric channel. Scattered large and small bowel gas is noted. No other focal abnormality is seen.  IMPRESSION: Gastric catheter within the distal stomach.   Electronically Signed   By: Inez Catalina M.D.   On: 12/05/2014 11:33   Ct Head Wo Contrast  12/04/2014   CLINICAL DATA:  Found unresponsive.  Hypoglycemia  EXAM: CT HEAD WITHOUT  CONTRAST  TECHNIQUE: Contiguous axial images were obtained from the base of the skull through the vertex without intravenous contrast.  COMPARISON:  08/01/2012  FINDINGS: There is no intracranial hemorrhage, mass or evidence of acute infarction. There is mild generalized atrophy. There is mild chronic microvascular ischemic change. There is no significant extra-axial fluid collection.  No acute intracranial findings are evident.  There is mild foamy secretions in the left sphenoid sinus.  IMPRESSION: No acute intracranial findings. There is mild generalized atrophy and chronic microvascular ischemic change.   Electronically Signed   By: Andreas Newport M.D.   On: 12/04/2014 22:22   Mr Brain Wo Contrast  12/11/2014   CLINICAL DATA:  Patient was found unresponsive at home. History of diabetes, hypertension, and polysubstance abuse. Subsequent encounter.  EXAM: MRI HEAD WITHOUT CONTRAST  TECHNIQUE: Multiplanar, multiecho pulse sequences of the brain and surrounding structures were obtained without intravenous contrast.  COMPARISON:  12/05/2014 MR head.  CT head 12/04/2014.  FINDINGS: Redemonstrated are BILATERAL symmetric areas of restricted diffusion within the body and tail of the hippocampus extending into posterior cingulate gyrus. These are accompanied by T2 and FLAIR hyperintensity. No other areas of restricted diffusion are observed.  Motion degraded axial images through the remainder of the brain demonstrates premature atrophy with chronic microvascular ischemic change. No definite hemorrhage. No mass lesion or hydrocephalus and no extra-axial fluid.  IMPRESSION: Stable abnormal brain MR with restricted diffusion and T2/FLAIR hyperintensity within the limbic system affecting the hippocampus primarily. Given the cerebrospinal fluid findings with elevated white count, but negative HSV, favor non-HSV viral encephalitis. The imaging pattern would be unusual for bacterial cerebritis despite CSF neutrophil  predominance. No definite meningeal thickening on motion degraded FLAIR imaging.   Electronically Signed   By: Rolla Flatten M.D.   On: 12/11/2014 15:00   Mr Jeri Cos OJ Contrast  12/05/2014   CLINICAL DATA:  Unresponsive.  On ventilator.  Low-grade fever.  EXAM: MRI HEAD WITHOUT AND WITH CONTRAST  TECHNIQUE: Multiplanar, multiecho pulse sequences of the brain and surrounding structures were obtained without and with intravenous contrast.  CONTRAST:  11mL MULTIHANCE GADOBENATE DIMEGLUMINE 529 MG/ML IV SOLN  COMPARISON:  CT head 12/04/2014  FINDINGS: Image quality degraded by mild motion.  Diffusion-weighted signal is present in the hippocampus bilaterally involving the body and tail of the hippocampus with some extension into the posterior cingulate gyrus. These areas also show T2 hyperintensity but do not show abnormal enhancement. This is symmetric. No significant masslike enlargement of the structures.  Ventricle size is normal.  No shift of the midline structures.  Negative for hemorrhage.  No mass lesion  Normal enhancement following contrast infusion. Note the patient began to move during the contrast enhanced images.  Mucosal edema in the paranasal sinuses with small air-fluid levels in the maxillary sinus bilaterally.  IMPRESSION: Symmetric signal abnormality on diffusion in the body and tail of the hippocampus extending into the posterior  cingulate gyrus. No significant abnormal enhancement. This pattern is nonspecific but could be seen with limbic encephalitis.  Differential diagnosis includes herpes encephalitis, paraneoplastic encephalitis, and seizure related signal abnormality.  These results were called by telephone at the time of interpretation on 12/05/2014 at 3:34 pm to Dr. Nadara Mode , who verbally acknowledged these results.   Electronically Signed   By: Franchot Gallo M.D.   On: 12/05/2014 15:35   Dg Chest Port 1 View  12/18/2014   CLINICAL DATA:  Acute respiratory failure, subsequent  encounter.  EXAM: PORTABLE CHEST - 1 VIEW  COMPARISON:  12/16/2014.  FINDINGS: Endotracheal tube terminates 3.8 cm above the carina. Nasogastric tube is followed into the stomach. Left IJ central line tip projects over the SVC. There may be calcified bi hilar lymph nodes. Linear subsegmental atelectasis in the lingula and medial left lower lobe. Lungs are otherwise clear. No pleural fluid.  IMPRESSION: No acute findings.   Electronically Signed   By: Lorin Picket M.D.   On: 12/18/2014 07:11   Dg Chest Port 1 View  12/16/2014   CLINICAL DATA:  J96.00 (ICD-10-CM) - Acute respiratory failureHx of diabetes, HTN. Current every day smoker.  EXAM: PORTABLE CHEST - 1 VIEW  COMPARISON:  12/14/2014 and 12/13/2014.  FINDINGS: 0558 hours. The endotracheal tube, left IJ central venous catheter and nasogastric tube appear unchanged. The heart size and mediastinal contours are stable. There is improved aeration of the left lung base with minimal residual bibasilar atelectasis. No consolidation, edema, pleural effusion or pneumothorax identified. The bones appear unchanged.  IMPRESSION: Improving left basilar aeration.  Stable support system.   Electronically Signed   By: Richardean Sale M.D.   On: 12/16/2014 08:54   Dg Chest Port 1 View  12/14/2014   CLINICAL DATA:  Respiratory failure.  EXAM: PORTABLE CHEST - 1 VIEW  COMPARISON:  12/13/2014.  12/12/2014.  FINDINGS: Endotracheal tube, left IJ line, NG tube in stable position. Low lung volumes with stable bibasilar atelectasis and or infiltrates. Heart size stable. Normal pulmonary vascularity. No pleural effusion or pneumothorax. No acute osseus abnormality.  IMPRESSION: 1. Lines and tubes in stable position. 2. Persistent bibasilar atelectasis and/or infiltrates.   Electronically Signed   By: Marcello Moores  Register   On: 12/14/2014 07:38   Dg Chest Port 1 View  12/13/2014   CLINICAL DATA:  Acute respiratory failure  EXAM: PORTABLE CHEST - 1 VIEW  COMPARISON:  12/12/2014   FINDINGS: Endotracheal tube in good position. Left jugular catheter tip in the SVC at the cavoatrial junction unchanged. NG tube enters the stomach with the tip not visualized.  Bibasilar airspace disease with mild progression. Negative for edema or effusion.  IMPRESSION: Support lines remain in good position.  Progression of bibasilar atelectasis/ infiltrate right greater than left.   Electronically Signed   By: Franchot Gallo M.D.   On: 12/13/2014 07:22   Dg Chest Port 1 View  12/12/2014   CLINICAL DATA:  Respiratory failure.  EXAM: PORTABLE CHEST - 1 VIEW  COMPARISON:  12/11/2014.  FINDINGS: Endotracheal tube, left IJ line, NG tube in stable position. Subsegmental atelectasis in the lung bases. No pleural effusion or pneumothorax. Heart size is stable. No acute bony abnormality.  IMPRESSION: 1. Lines and tubes in stable position. 2. Mild basilar subsegmental atelectasis.   Electronically Signed   By: [place]   On: 12/12/2014 07:16   Dg Chest Port 1 View  12/11/2014   CLINICAL DATA:  Respiratory failure.  EXAM: PORTABLE  CHEST - 1 VIEW  COMPARISON:  12/10/2014  FINDINGS: Endotracheal tube remains implants with tip approximately 3 cm above the carina. Left jugular central venous catheter is unchanged with tip overlying the lower SVC. Enteric tube courses towards the left upper abdomen with tip not imaged. Cardiac silhouette remains upper limits of normal in size. Mild central pulmonary vascular congestion is unchanged. No overt pulmonary edema, airspace consolidation, pleural effusion, or pneumothorax is identified.  IMPRESSION: Lines and tubes as above.  No airspace consolidation.   Electronically Signed   By: Logan Bores   On: 12/11/2014 07:09   Dg Chest Port 1 View  12/10/2014   CLINICAL DATA:  Hypoxia/respiratory failure  EXAM: PORTABLE CHEST - 1 VIEW  COMPARISON:  December 09, 2014  FINDINGS: Endotracheal tube tip is 3.9 cm above the carina. Nasogastric tube tip and side port are below the  diaphragm. Central catheter tip is in the superior vena cava. No pneumothorax. Lungs are clear. Heart is upper normal in size with pulmonary vascularity within normal limits. No adenopathy.  IMPRESSION: Tube and catheter positions as described without pneumothorax. No edema or consolidation.   Electronically Signed   By: Lowella Grip III M.D.   On: 12/10/2014 07:06   Dg Chest Port 1 View  12/09/2014   CLINICAL DATA:  Acute onset of respiratory failure. Subsequent encounter.  EXAM: PORTABLE CHEST - 1 VIEW  COMPARISON:  Chest radiograph performed 12/08/2014  FINDINGS: The patient's endotracheal tube is seen ending just under 1 cm above the carina. This could be retracted 2 cm.  The left IJ line is noted ending overlying the mid to distal SVC. The enteric tube is noted extending below the diaphragm.  Vascular congestion is again noted, with mildly increased interstitial markings, suggestive of mild interstitial edema. No pleural effusion or pneumothorax is seen.  The cardiomediastinal silhouette is borderline enlarged. No acute osseous abnormalities are identified.  IMPRESSION: 1. Endotracheal tube seen ending just under 1 cm above the carina. This could be retracted 2 cm. 2. Vascular congestion again noted, with mildly increased interstitial markings, suggestive of mild interstitial edema, given the patient's symptoms. Borderline cardiomegaly.  These results were called by telephone at the time of interpretation on 12/09/2014 at 7:31 am to [place] at the [facility] who verbally acknowledged these results.   Electronically Signed   By: Garald Balding M.D.   On: 12/09/2014 07:32   Dg Chest Port 1 View  12/08/2014   CLINICAL DATA:  Intubation.  EXAM: PORTABLE CHEST - 1 VIEW  COMPARISON:  12/07/2014.  FINDINGS: Endotracheal tube, left IJ line, NG tube in good anatomic position. Heart size normal. Mild basilar atelectasis. No pleural effusion or pneumothorax.  IMPRESSION: 1. Lines and tubes  stable position. 2. Mild basilar atelectasis.   Electronically Signed   By: Marcello Moores  Register   On: 12/08/2014 07:29   Dg Chest Port 1 View  12/07/2014   CLINICAL DATA:  Hypoxia  EXAM: PORTABLE CHEST - 1 VIEW  COMPARISON:  December 06, 2014  FINDINGS: Endotracheal tube tip is 3.3 cm above the carina. Nasogastric tube tip and side port are below the diaphragm. Central catheter tip is in the superior vena cava near the cavoatrial junction. No pneumothorax. Lungs are clear. The heart size and pulmonary vascularity are normal. No adenopathy. No bone lesions.  IMPRESSION: Tube and catheter positions as described without pneumothorax. No edema or consolidation.   Electronically Signed   By: Lowella Grip III M.D.  On: 12/07/2014 07:13   Dg Chest Port 1 View  12/06/2014   CLINICAL DATA:  Central line placement. Central line in surgeons site infection. Initial encounter. Patient Comments: Hx ETT CVC placement Central line insertion site infection, initial encounter; History: N/A; Study comments: N/A  EXAM: PORTABLE CHEST - 1 VIEW  COMPARISON:  12/06/2014, 0502 hours.  FINDINGS: Support apparatus: There is a new uncomplicated LEFT IJ central line with the tip in the mid SVC, just inferior to the carina. Enteric tube is unchanged. Endotracheal tube tip position unchanged, 27 mm from the carina. Enteric tube present. Monitoring leads project over the chest.  Cardiomediastinal Silhouette:  Unchanged and within normal limits.  Lungs: No airspace disease.  No effusion.  No pneumothorax.  Effusions:  None.  Other:  None.  IMPRESSION: 1. Uncomplicated interval placement of LEFT IJ central line with the tip in the mid SVC. 2. Other support apparatus within normal limits. Endotracheal tube tip 27 mm from the carina. 3. No visible cardiopulmonary disease.   Electronically Signed   By: Dereck Ligas M.D.   On: 12/06/2014 13:37   Dg Chest Port 1 View  12/06/2014   CLINICAL DATA:  Intubation.  EXAM: PORTABLE CHEST - 1 VIEW   COMPARISON:  12/04/2014.  FINDINGS: Endotracheal tube and NG tube in good anatomic position. Heart size normal. No focal pulmonary infiltrate. No pleural effusion or pneumothorax. No acute osseous abnormality.  IMPRESSION: 1. Lines and tubes in stable position. 2. No acute cardiopulmonary disease identified. Chest is stable from prior exam.   Electronically Signed   By: Marcello Moores  Register   On: 12/06/2014 07:25   Dg Chest Port 1 View  12/04/2014   CLINICAL DATA:  Post intubation  EXAM: PORTABLE CHEST - 1 VIEW  COMPARISON:  12/04/2014 at 20:24  FINDINGS: The endotracheal tube is 1.3 cm above the carina. The lungs are clear. There is no pneumothorax. No large effusions. Heart size is normal and unchanged.  IMPRESSION: ET tube is a short distance above the carina.   Electronically Signed   By: Andreas Newport M.D.   On: 12/04/2014 22:12   Dg Chest Port 1 View  12/04/2014   CLINICAL DATA:  Patient is unresponsive, respiratory distress.  EXAM: PORTABLE CHEST - 1 VIEW  COMPARISON:  August 23, 2012, August 01, 2012  FINDINGS: There is prominence opacity of right suprahilar space. The heart size is normal. There is no focal infiltrate, pulmonary edema, or pleural effusion. The visualized skeletal structures are unremarkable.  IMPRESSION: Prominent opacity of the right suprahilar space. Further evaluation with chest CT is recommended to exclude underlying mass.   Electronically Signed   By: Abelardo Diesel M.D.   On: 12/04/2014 21:12   Dg Fluoro Guide Lumbar Puncture  12/06/2014   CLINICAL DATA:  Altered mental status, patient unresponsive/obtunded.  EXAM: DIAGNOSTIC LUMBAR PUNCTURE UNDER FLUOROSCOPIC GUIDANCE  FLUOROSCOPY TIME:  Radiation Exposure Index (as provided by the fluoroscopic device):  If the device does not provide the exposure index:  Fluoroscopy Time (in minutes and seconds):  1 minutes 57 seconds  Number of Acquired Images:  1  PROCEDURE: Informed consent was obtained from the patient prior to the  procedure, including potential complications of headache, allergy, and pain. With the patient prone, the lower back was prepped with Betadine. 1% Lidocaine was used for local anesthesia. Lumbar puncture was performed at the L2-L3 level using a 22 gauge needle with return of clear CSF with an opening pressure of 12 cm water.  8.5 ml of CSF were obtained for laboratory studies. The patient tolerated the procedure well and there were no apparent complications.  IMPRESSION: Successful lumbar puncture without complication   Electronically Signed   By: Suzy Bouchard M.D.   On: 12/06/2014 12:22    Labs:  CBC:  Recent Labs  12/14/14 0500 12/15/14 0430 12/16/14 0430 12/19/14 0520  WBC 12.1* 11.8* 12.0* 9.7  HGB 7.6* 7.4* 7.5* 6.6*  HCT 24.8* 23.8* 23.2* 21.0*  PLT 186 207 190 172    COAGS:  Recent Labs  12/04/14 2031 12/19/14 0750  INR 0.97 1.06  APTT  --  37    BMP:  Recent Labs  12/15/14 0430 12/16/14 0430 12/17/14 0412 12/19/14 0520  NA 153* 148* 140 142  K 4.0 3.7 3.9 3.8  CL 114* 104 102 103  CO2 32 _0 GLUCOSE 207* 230* 247* 151*  BUN 53* 43* 43* 37*  CALCIUM 8.8 8.3* 8.2* 8.6  CREATININE 1.27* 1.21* 1.19* 1.11*  GFRNONAA 45* 48* 48* 52*  GFRAA 52* 55* 56* 61*    LIVER FUNCTION TESTS:  Recent Labs  12/04/14 2031 12/09/14 1737 12/10/14 0315 12/13/14 0400  BILITOT 0.4  --  0.2* 0.2*  AST 25  --  34 120*  ALT 13  --  39* 157*  ALKPHOS 73  --  72 65  PROT 7.5  --  6.4 5.5*  ALBUMIN 3.8 2.4* 2.4*  2.5* 2.1*    TUMOR MARKERS: No results for input(s): AFPTM, CEA, CA199, CHROMGRNA in the last 8760 hours.  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 Request now received for placement of percutaneous gastrostomy tube. Recent imaging studies  have been reviewed and patient appears to be candidate for gastrostomy tube placement anatomically. She is currently afebrile with negative blood and CSF cultures, hemoglobin of 6.6 today (pending transfusion ), creatinine now near normalized at 1.11. Details/risks of gastrostomy tube placement, including but not limited to, internal bleeding, infection, injury to adjacent organs were discussed with patient's daughter Howard Patton with her apparent understanding and consent. Tentative plan at this time is for gastrostomy tube placement on 4/7. CCM aware.    Signed: Autumn Messing 12/19/2014, 10:58 AM   I spent a total of  20 minutes   in face to face in clinical consultation, greater than 50% of which was counseling/coordinating care for percutaneous gastrostomy tube placement

## 2014-12-19 NOTE — Procedures (Signed)
Name:  [name] MRN:  [id number] DOB:  08/04/1956  OPERATIVE NOTE  Procedure:  Percutaneous tracheostomy.  Indications:  Ventilator-dependent respiratory failure.  Consent:  Procedure, alternatives, risks and benefits discussed with medical POA.  Questions answered.  Consent obtained.  Anesthesia: fent, versed, prop, vec  Procedure summary:  Appropriate equipment was assembled.  The patient was identified as Gina Cox and safety timeout was performed. The patient was placed in supine position with a towel roll behind shoulder blades and neck extended.  Sterile technique was used. The patient's neck and upper chest were prepped using chlorhexidine / alcohol scrub and the field was draped in usual sterile fashion with full body drape. After the adequate sedation / anesthesia was achieved, attention was directed at the midline trachea, where the cricothyroid membrane was palpated. Approximately two fingerbreadths above the sternal notch, a horizontal 1.0 cm  incision was created with a scalpel after local infiltration with 0.2% Lidocaine. Then, using Seldinger technique and a percutaneous tracheostomy set, the trachea was entered with a 14 gauge needle with an overlying sheath. This was all confirmed under direct visualization of a fiberoptic flexible bronchoscope. Entrance into the trachea was identified through the third tracheal ring interspace. Following this, a guidewire was inserted. The needle was removed, leaving the sheath and the guidewire intact. Next, the sheath was removed and a small dilator was inserted. The tracheal rings were then dilated. A #6 Shiley was then opened. The balloon was checked. It was placed over a tracheal dilator, which was then advanced over the guidewire and through the previously dilated tract. The Shiley tracheostomy tube was noted to pass in the trachea with little resistance. The guidewire and dilator tubes were removed from the trachea. An inner cannula  was placed through the tracheostomy tube. The tracheostomy was then secured at the anterior neck with 4 monofilament sutures. The oral endotracheal tube was removed and the ventilator was attached to the newly placed tracheostomy tube. Adequate tidal volumes were noted. The cuff was inflated and no evidence of air leak was noted. No evidence of bleeding was noted. At this point, the procedure was concluded. Post-procedure chest x-ray was ordered.  Complications:  No immediate complications were noted.  Hemodynamic parameters and oxygenation remained stable throughout the procedure.  Estimated blood loss:  Less then 1 mL.  Nelda Bucks., MD Pulmonary and Critical Care Medicine [facility] Pager: [phone]  12/19/2014, 2:48 PM   Can follow up trach clinic [phone]

## 2014-12-20 ENCOUNTER — Inpatient Hospital Stay (HOSPITAL_COMMUNITY): Payer: Medicare Other

## 2014-12-20 DIAGNOSIS — Z430 Encounter for attention to tracheostomy: Secondary | ICD-10-CM

## 2014-12-20 LAB — CBC
HCT: 22.7 % — ABNORMAL LOW (ref 36.0–46.0)
Hemoglobin: 7.2 g/dL — ABNORMAL LOW (ref 12.0–15.0)
MCH: 30.8 pg (ref 26.0–34.0)
MCHC: 31.7 g/dL (ref 30.0–36.0)
MCV: 97 fL (ref 78.0–100.0)
Platelets: 168 10*3/uL (ref 150–400)
RBC: 2.34 MIL/uL — ABNORMAL LOW (ref 3.87–5.11)
RDW: 15.2 % (ref 11.5–15.5)
WBC: 9.3 10*3/uL (ref 4.0–10.5)

## 2014-12-20 LAB — TYPE AND SCREEN
ABO/RH(D): O POS
ANTIBODY SCREEN: NEGATIVE
Unit division: 0

## 2014-12-20 LAB — BASIC METABOLIC PANEL
Anion gap: 8 (ref 5–15)
BUN: 39 mg/dL — ABNORMAL HIGH (ref 6–23)
CO2: 31 mmol/L (ref 19–32)
Calcium: 8.8 mg/dL (ref 8.4–10.5)
Chloride: 104 mmol/L (ref 96–112)
Creatinine, Ser: 1.19 mg/dL — ABNORMAL HIGH (ref 0.50–1.10)
GFR calc Af Amer: 56 mL/min — ABNORMAL LOW (ref >90)
GFR calc non Af Amer: 48 mL/min — ABNORMAL LOW (ref >90)
Glucose, Bld: 162 mg/dL — ABNORMAL HIGH (ref 70–99)
Potassium: 4.1 mmol/L (ref 3.5–5.1)
Sodium: 143 mmol/L (ref 135–145)

## 2014-12-20 LAB — GLUCOSE, CAPILLARY
Glucose-Capillary: 204 mg/dL — ABNORMAL HIGH (ref 70–99)
Glucose-Capillary: 154 mg/dL — ABNORMAL HIGH (ref 70–99)
Glucose-Capillary: 152 mg/dL — ABNORMAL HIGH (ref 70–99)

## 2014-12-20 LAB — MAGNESIUM: Magnesium: 1.6 mg/dL (ref 1.5–2.5)

## 2014-12-20 LAB — PHOSPHORUS: PHOSPHORUS: 4.1 mg/dL (ref 2.3–4.6)

## 2014-12-20 MED ORDER — CEFAZOLIN SODIUM-DEXTROSE 2-3 GM-% IV SOLR
2.0000 g | INTRAVENOUS | Status: AC
  Administered 2014-12-21: 2 g via INTRAVENOUS
  Filled 2014-12-20: qty 50

## 2014-12-20 NOTE — Progress Notes (Signed)
PULMONARY / CRITICAL CARE MEDICINE   Name: Gina Cox MRN: [id number] DOB: 07-Jan-1956    ADMISSION DATE:  12/04/2014  REFERRING MD :  Madilyn Hook EDP  CHIEF COMPLAINT:  Found down  INITIAL PRESENTATION: 59 y/o female found unresponsive by family at home.  CBG 54 in ER, and intubated for airway protection >> Difficult airway.  STUDIES:  3/21 CT head >> no acute intracranial process 3/22 MRI Brain >> hippocampal diffusion signal abnormal to the posterior singulate gyrus  3/22 EEG >> no seizure activity 3/22 LP >> glucose 96, protein 42, WBC 128 (82%N), RBC 13 3/25 EEG >> toxic/metabolic encephalopathy.  No seizure activity 3/28 MRI Brain >> stable, abnormal brain MR with hyperintensity in limbic system affecting the hippocampus  SIGNIFICANT EVENTS: 3/21  Admit 3/22  Neurology consulted 3/24  ID consulted 3/26  acyclovir, ampicillin, decadron d/c'ed 4/01  Pt with new but minimal movement of fingers / toes on command 4/04  Tolerated PSV 5/5  SUBJECTIVE:  RN reports noted increase in patient movement of head, no response to commands, no acute events.    VITAL SIGNS: Temp:  [98.6 F (37 C)-99.8 F (37.7 C)] 99.2 F (37.3 C) (04/06 0815) Pulse Rate:  [78-84] 78 (04/06 0800) Resp:  [10-25] 14 (04/06 1000) BP: (98-201)/(42-93) 145/64 mmHg (04/06 1000) SpO2:  [94 %-100 %] 96 % (04/06 1000) FiO2 (%):  [30 %-100 %] 30 % (04/06 0957) Weight:  [217 lb 6 oz (98.6 kg)] 217 lb 6 oz (98.6 kg) (04/06 0500)   VENTILATOR SETTINGS: Vent Mode:  [-] PRVC FiO2 (%):  [30 %-100 %] 30 % Set Rate:  [14 bmp-20 bmp] 14 bmp Vt Set:  [440 mL] 440 mL PEEP:  [5 cmH20] 5 cmH20 Plateau Pressure:  [16 cmH20-24 cmH20] 16 cmH20   INTAKE / OUTPUT:  Intake/Output Summary (Last 24 hours) at 12/20/14 1044 Last data filed at 12/20/14 1000  Gross per 24 hour  Intake 2163.91 ml  Output   3975 ml  Net -1811.09 ml   PHYSICAL EXAMINATION: General: no distress Neuro: Opened eyes immediately to voice.  No  follow commands.  Moves head from side to side HEENT:  mm pink moist, trach midline c/d/i Cardiovascular: s1s2 regular Lungs: even/non-labored, scattered rhonchi Abdomen: soft, non tender Musculoskeletal: 1+ pretibial edema Skin: no rashes  LABS:  CBC  Recent Labs Lab 12/16/14 0430 12/19/14 0520 12/19/14 1733 12/20/14 0545  WBC 12.0* 9.7  --  9.3  HGB 7.5* 6.6* 7.6* 7.2*  HCT 23.2* 21.0* 23.9* 22.7*  PLT 190 172  --  168    BMET  Recent Labs Lab 12/17/14 0412 12/19/14 0520 12/20/14 0545  NA 140 142 143  K 3.9 3.8 4.1  CL 102 103 104  CO2 29 31 31   BUN 43* 37* 39*  CREATININE 1.19* 1.11* 1.19*  GLUCOSE 247* 151* 162*   Electrolytes  Recent Labs Lab 12/15/14 0430  12/17/14 0412 12/19/14 0520 12/20/14 0545  CALCIUM 8.8  < > 8.2* 8.6 8.8  MG 1.7  --   --   --  1.6  PHOS 3.5  --   --   --  4.1  < > = values in this interval not displayed.    ABG No results for input(s): PHART, PCO2ART, PO2ART in the last 168 hours.   Liver Enzymes No results for input(s): AST, ALT, ALKPHOS, BILITOT, ALBUMIN in the last 168 hours.   Glucose  Recent Labs Lab 12/19/14 1203 12/19/14 1616 12/19/14 2037 12/19/14 2329 12/20/14  0409 12/20/14 0819  GLUCAP 103* 152* 199* 204* 154* 152*   Imaging Dg Chest Port 1 View  12/19/2014   CLINICAL DATA:  Tracheostomy in NG tube placement  EXAM: PORTABLE CHEST - 1 VIEW  COMPARISON:  12/18/2014  FINDINGS: Tracheostomy in satisfactory position, 3.5 cm above the carina.  Enteric tube terminates in the gastric body.  Left IJ venous catheter terminates in the mid SVC.  Lungs are clear.  No pleural effusion or pneumothorax.  The heart is top-normal in size.  IMPRESSION: Tracheostomy in satisfactory position, 3.5 cm above the carina.   Electronically Signed   By: Charline Bills M.D.   On: 12/19/2014 15:28   ASSESSMENT / PLAN:  NEUROLOGIC A:   Acute encephalopathy - considered HSV encephalitis, meningitis, metabolic (hypoglycemia on  admission, elevated ammonia, AKI).  Likely diabetic / hypoglycemic encephalopathy with AKI - Ehrlichia panel negative - RMSF negative - ANA and ANCA negative - CSF HSV negative Hx of ETOH, cocaine abuse. P:   Monitor mental status RASS goal 0 F/u anti-NMDAR, anti-AMPA >> negative  Continue lactulose Thiamine, folic acid Passive ROM  Suspect poor prognosis for recovery here.   PULMONARY OETT 3/21 >>> A:   Acute respiratory failure secondary to inability to protect airway >> difficult airway. Tobacco abuse. P:   Pressure support wean as tolerated >> mental status prevents extubation  F/u CXR intermittently  Trach care per protocol  SBT daily   CARDIOVASCULAR A: Hx HTN, HLD. P:  Continue norvasc 10 mg  Metoprolol 100 mg bid  Hold outpt lisinopril, simvastatin, nebivolol  RENAL A:  AKI - creatinine plateau, now improving .  FENa 0.67 %, suggestive of pre-renal failure Gap/non gap metabolic acidosis >> improved 1/61. Hypokalemia. Hypernatremia P:   Renal dose adjust meds D5W @ KVO Replace electrolytes as needed.   F/u BMET Free water 250 ml Q4  GASTROINTESTINAL A:  Nutrition. Diarrhea >> C diff negative 3/24. P:   TF while on vent Protonix Pending PEG placement per IR 4/7   HEMATOLOGIC A:  Anemia of critical illness - s/p PRBC 4/5 x1 P:  F/u CBC SQ heparin for DVT prevention Assess anemia panel in am 4/7  INFECTIOUS A:   Possible meningitis - suspect neurologic injury related to hypoglycemic event + prolonged period of downtime.  P:   Rocephin, vancomycin for 14 days total, completed 4/4  Blood 3/21 >> neg  ENDOCRINE A:  DM with renal complications. P:   SSI Changed lantus to 8 units bid on 3/29 Hold outpt pioglitazone, metformin   LTAC consult per Care Mgmt  Canary Brim, NP-C [facility] Pgr: [phone] or [phone]   12/20/2014, 10:44 AM  Attending Note:  I have examined patient, reviewed labs, studies and  notes. I have discussed the case with B Ollis, and I agree with the data and plans as amended above.  Levy Pupa, MD, PhD 12/21/2014, 2:34 PM [facility] [phone] or if no answer [phone]

## 2014-12-21 ENCOUNTER — Inpatient Hospital Stay (HOSPITAL_COMMUNITY): Payer: Medicare Other

## 2014-12-21 DIAGNOSIS — Z430 Encounter for attention to tracheostomy: Secondary | ICD-10-CM | POA: Insufficient documentation

## 2014-12-21 LAB — BASIC METABOLIC PANEL
ANION GAP: 8 (ref 5–15)
BUN: 39 mg/dL — ABNORMAL HIGH (ref 6–23)
CALCIUM: 8.8 mg/dL (ref 8.4–10.5)
CO2: 31 mmol/L (ref 19–32)
Chloride: 103 mmol/L (ref 96–112)
Creatinine, Ser: 1.2 mg/dL — ABNORMAL HIGH (ref 0.50–1.10)
GFR calc Af Amer: 55 mL/min — ABNORMAL LOW (ref >90)
GFR calc non Af Amer: 48 mL/min — ABNORMAL LOW (ref >90)
Glucose, Bld: 234 mg/dL — ABNORMAL HIGH (ref 70–99)
Potassium: 3.9 mmol/L (ref 3.5–5.1)
SODIUM: 142 mmol/L (ref 135–145)

## 2014-12-21 LAB — FOLATE: Folate: >20 ng/mL

## 2014-12-21 LAB — HERPES SIMPLEX VIRUS(HSV) DNA BY PCR: HSV 2 DNA: NEGATIVE

## 2014-12-21 LAB — GLUCOSE, CAPILLARY
Glucose-Capillary: 112 mg/dL — ABNORMAL HIGH (ref 70–99)
Glucose-Capillary: 125 mg/dL — ABNORMAL HIGH (ref 70–99)
Glucose-Capillary: 136 mg/dL — ABNORMAL HIGH (ref 70–99)
Glucose-Capillary: 183 mg/dL — ABNORMAL HIGH (ref 70–99)
Glucose-Capillary: 237 mg/dL — ABNORMAL HIGH (ref 70–99)
Glucose-Capillary: 239 mg/dL — ABNORMAL HIGH (ref 70–99)
Glucose-Capillary: 74 mg/dL (ref 70–99)

## 2014-12-21 LAB — CBC
HEMATOCRIT: 22.8 % — AB (ref 36.0–46.0)
Hemoglobin: 7.2 g/dL — ABNORMAL LOW (ref 12.0–15.0)
MCH: 30.5 pg (ref 26.0–34.0)
MCHC: 31.6 g/dL (ref 30.0–36.0)
MCV: 96.6 fL (ref 78.0–100.0)
Platelets: 177 10*3/uL (ref 150–400)
RBC: 2.36 MIL/uL — ABNORMAL LOW (ref 3.87–5.11)
RDW: 14.7 % (ref 11.5–15.5)
WBC: 8.2 10*3/uL (ref 4.0–10.5)

## 2014-12-21 LAB — IRON AND TIBC
Iron: 23 ug/dL — ABNORMAL LOW (ref 42–145)
Saturation Ratios: 13 % — ABNORMAL LOW (ref 20–55)
TIBC: 182 ug/dL — ABNORMAL LOW (ref 250–470)
UIBC: 159 ug/dL (ref 125–400)

## 2014-12-21 LAB — CULTURE, BAL-QUANTITATIVE W GRAM STAIN
Colony Count: 75000
Special Requests: NORMAL

## 2014-12-21 LAB — RETICULOCYTES
RBC.: 2.36 MIL/uL — AB (ref 3.87–5.11)
RETIC CT PCT: 2.3 % (ref 0.4–3.1)
Retic Count, Absolute: 54.3 10*3/uL (ref 19.0–186.0)

## 2014-12-21 LAB — CULTURE, BAL-QUANTITATIVE

## 2014-12-21 LAB — FERRITIN: Ferritin: 374 ng/mL — ABNORMAL HIGH (ref 10–291)

## 2014-12-21 LAB — VITAMIN B12: Vitamin B-12: 852 pg/mL (ref 211–911)

## 2014-12-21 MED ORDER — FENTANYL CITRATE 0.05 MG/ML IJ SOLN
INTRAMUSCULAR | Status: AC | PRN
  Administered 2014-12-21: 25 ug via INTRAVENOUS

## 2014-12-21 MED ORDER — MIDAZOLAM HCL 2 MG/2ML IJ SOLN
INTRAMUSCULAR | Status: AC
  Filled 2014-12-21: qty 6

## 2014-12-21 MED ORDER — IOHEXOL 300 MG/ML  SOLN
10.0000 mL | Freq: Once | INTRAMUSCULAR | Status: AC | PRN
  Administered 2014-12-21: 10 mL

## 2014-12-21 MED ORDER — FENTANYL CITRATE 0.05 MG/ML IJ SOLN
12.5000 ug | INTRAMUSCULAR | Status: DC | PRN
  Administered 2014-12-25 – 2014-12-26 (×8): 25 ug via INTRAVENOUS
  Filled 2014-12-21 (×8): qty 2

## 2014-12-21 MED ORDER — MIDAZOLAM HCL 2 MG/2ML IJ SOLN
INTRAMUSCULAR | Status: AC | PRN
  Administered 2014-12-21: 0.5 mg via INTRAVENOUS

## 2014-12-21 MED ORDER — CEFAZOLIN SODIUM-DEXTROSE 2-3 GM-% IV SOLR
INTRAVENOUS | Status: AC
  Filled 2014-12-21: qty 50

## 2014-12-21 MED ORDER — FENTANYL CITRATE 0.05 MG/ML IJ SOLN
INTRAMUSCULAR | Status: AC
  Filled 2014-12-21: qty 4

## 2014-12-21 MED ORDER — GLUCAGON HCL RDNA (DIAGNOSTIC) 1 MG IJ SOLR
INTRAMUSCULAR | Status: AC
  Filled 2014-12-21: qty 1

## 2014-12-21 MED ORDER — LIDOCAINE HCL 1 % IJ SOLN
INTRAMUSCULAR | Status: AC
  Filled 2014-12-21: qty 20

## 2014-12-21 NOTE — Progress Notes (Signed)
Per Florentina Addison, NP CCM- RT to place on ATC- completed by Blenda Nicely, RRT

## 2014-12-21 NOTE — Care Management Note (Signed)
CARE MANAGEMENT NOTE 12/21/2014  Patient:  Gina Cox, Gina Cox   Account Number:  [id number]  Date Initiated:  12/05/2014  Documentation initiated by:  Andreatta,RHONDA  Subjective/Objective Assessment:   59 year old F brought to the [facility] ED on 12/04/2014 after being found down at home by daughter.  Last seen normal 1 day prior. Required intubation in the emergency department for airway protection     Action/Plan:   tbd   Anticipated DC Date:  12/24/2014   Anticipated DC Plan:  SKILLED NURSING FACILITY  In-house referral  Clinical Social Worker      DC Planning Services  CM consult      [facility] Choice  NA   Choice offered to / List presented to:  NA   DME arranged  NA           Status of service:  In process, will continue to follow Medicare Important Message given?   (If response is "NO", the following Medicare IM given date fields will be blank) Date Medicare IM given:   Medicare IM given by:   Date Additional Medicare IM given:   Additional Medicare IM given by:    Discharge Disposition:    Per UR Regulation:  Reviewed for med. necessity/level of care/duration of stay  If discussed at Long Length of Stay Meetings, dates discussed:   12/12/2014  12/14/2014    Comments:  [id number] Keyra, Virella RN,BSN,CCM: [phone] Chart reviewed for patient needs and discharge planning. continues to require full vent support/ was trach'd on [id number]  [id number]/Rhonda Earlene Plater RN, BSN, CCN: [id number]/[phone] Case management. Chart reviewed for discharge planning and present needs. Discharge needs: none present at time of review. Continues to requre full vent support Fi02 at 30% and sedated.  December 14, 2014/Rhonda L. Earlene Plater, RN, BSN, CCM. Case Management [facility] [phone] No discharge needs present of time of review. Remains on vent, failed weaning.  December 11, 2014/Rhonda L. Earlene Plater, RN, BSN, CCM. Case Management [facility] [phone] No discharge needs  present of time of review. Intaubated, off iv sedation but does not respond to stimuli.  Nerology in.  Poss bacterial menegitis. wbc elevated/ bld cultures are negative for growth/chem 12 panel abnormal, na 148, k+2.9  December 08, 2014/Rhonda L. Earlene Plater, RN, BSN, CCM. Case Management [facility] [phone] No discharge needs present of time of review. remains intubated, responsvie not sedated.  eeg pending  December 05, 2014/Rhonda L. Earlene Plater, RN, BSN, CCM. Case Management [facility] [phone] No discharge needs present of time of review.

## 2014-12-21 NOTE — Progress Notes (Signed)
PULMONARY / CRITICAL CARE MEDICINE   Name: Gina Cox MRN: [id number] DOB: 1955/10/04    ADMISSION DATE:  12/04/2014  REFERRING MD :  Madilyn Hook EDP  CHIEF COMPLAINT:  Found down  INITIAL PRESENTATION: 59 y/o female found unresponsive by family at home.  CBG 54 in ER, and intubated for airway protection >> Difficult airway.  STUDIES:  3/21 CT head >> no acute intracranial process 3/22 MRI Brain >> hippocampal diffusion signal abnormal to the posterior singulate gyrus  3/22 EEG >> no seizure activity 3/22 LP >> glucose 96, protein 42, WBC 128 (82%N), RBC 13 3/25 EEG >> toxic/metabolic encephalopathy.  No seizure activity 3/28 MRI Brain >> stable, abnormal brain MR with hyperintensity in limbic system affecting the hippocampus  SIGNIFICANT EVENTS: 3/21  Admit 3/22  Neurology consulted 3/24  ID consulted 3/26  acyclovir, ampicillin, decadron d/c'ed 4/01  Pt with new but minimal movement of fingers / toes on command 4/04  Tolerated PSV 5/5  SUBJECTIVE:  More awake but not following commands.  Tol PS 5/5.   For PEG placement today.   VITAL SIGNS: Temp:  [98.6 F (37 C)-99.9 F (37.7 C)] 99.9 F (37.7 C) (04/07 0400) Pulse Rate:  [77-84] 77 (04/07 0351) Resp:  [13-30] 30 (04/07 1000) BP: (112-170)/(47-75) 152/65 mmHg (04/07 1000) SpO2:  [94 %-100 %] 97 % (04/07 1000) FiO2 (%):  [28 %-30 %] 28 % (04/07 1030) Weight:  [217 lb 6 oz (98.6 kg)] 217 lb 6 oz (98.6 kg) (04/07 0400)   VENTILATOR SETTINGS: Vent Mode:  [-] CPAP;PSV FiO2 (%):  [28 %-30 %] 28 % Set Rate:  [14 bmp] 14 bmp Vt Set:  [440 mL] 440 mL PEEP:  [5 cmH20] 5 cmH20 Pressure Support:  [5 cmH20] 5 cmH20 Plateau Pressure:  [13 cmH20-18 cmH20] 17 cmH20   INTAKE / OUTPUT:  Intake/Output Summary (Last 24 hours) at 12/21/14 1042 Last data filed at 12/21/14 0900  Gross per 24 hour  Intake   1250 ml  Output   1500 ml  Net   -250 ml   PHYSICAL EXAMINATION: General: no distress Neuro: Opens eyes to voice.  Moving  around spontaneously, tracks.  HEENT:  mm pink moist, trach midline c/d/i Cardiovascular: s1s2 regular Lungs: even/non-labored, scattered rhonchi Abdomen: soft, non tender Musculoskeletal:  Scant BLE edema  Skin: no rashes  LABS:  CBC  Recent Labs Lab 12/19/14 0520 12/19/14 1733 12/20/14 0545 12/21/14 0430  WBC 9.7  --  9.3 8.2  HGB 6.6* 7.6* 7.2* 7.2*  HCT 21.0* 23.9* 22.7* 22.8*  PLT 172  --  168 177    BMET  Recent Labs Lab 12/19/14 0520 12/20/14 0545 12/21/14 0430  NA 142 143 142  K 3.8 4.1 3.9  CL 103 104 103  CO2 31 31 31   BUN 37* 39* 39*  CREATININE 1.11* 1.19* 1.20*  GLUCOSE 151* 162* 234*   Electrolytes  Recent Labs Lab 12/15/14 0430  12/19/14 0520 12/20/14 0545 12/21/14 0430  CALCIUM 8.8  < > 8.6 8.8 8.8  MG 1.7  --   --  1.6  --   PHOS 3.5  --   --  4.1  --   < > = values in this interval not displayed.    ABG No results for input(s): PHART, PCO2ART, PO2ART in the last 168 hours.   Liver Enzymes No results for input(s): AST, ALT, ALKPHOS, BILITOT, ALBUMIN in the last 168 hours.   Glucose  Recent Labs Lab 12/19/14 2329 12/20/14 0409  12/20/14 0819 12/21/14 0030 12/21/14 0411 12/21/14 0810  GLUCAP 204* 154* 152* 239* 237* 74   Imaging Dg Chest Port 1 View  12/20/2014   CLINICAL DATA:  Respiratory failure.  EXAM: PORTABLE CHEST - 1 VIEW  COMPARISON:  12/19/2014.  FINDINGS: Tracheostomy tube and NG tube in stable position. Left IJ line in stable position. Mediastinum hilar structures stable. Low lung volumes. Basilar atelectasis. Heart size stable. No pleural effusion or pneumothorax. No acute osseous abnormality.  IMPRESSION: 1. Lines and tubes in stable position. 2. Low lung volumes with basilar atelectasis.   Electronically Signed   By: Maisie Fus  Register   On: 12/20/2014 07:08   ASSESSMENT / PLAN:  NEUROLOGIC A:   Acute encephalopathy - considered HSV encephalitis, meningitis, metabolic (hypoglycemia on admission, elevated ammonia,  AKI).  Likely diabetic / hypoglycemic encephalopathy with AKI.  Slight improvement 4/7.  - Ehrlichia panel negative - RMSF negative - ANA and ANCA negative - CSF HSV negative Hx of ETOH, cocaine abuse. P:   Monitor mental status RASS goal 0 F/u anti-NMDAR, anti-AMPA >> negative  Continue lactulose Thiamine, folic acid Passive ROM  Suspect poor prognosis for meaningful recovery here.   PULMONARY OETT 3/21 >>>4/5 Trach (DF) 4/5>>> A:   Acute respiratory failure secondary to inability to protect airway >> difficult airway. Tobacco abuse. P:   Daily SBT, goal ATC  F/u CXR intermittently  Trach care per protocol    CARDIOVASCULAR A: Hx HTN, HLD. P:  Continue norvasc 10 mg  Metoprolol 100 mg bid  Hold outpt lisinopril, simvastatin, nebivolol  RENAL A:  AKI - creatinine plateau, now improving .  FENa 0.67 %, suggestive of pre-renal failure Gap/non gap metabolic acidosis >> resolved.  Hypokalemia. Hypernatremia P:   Renal dose adjust meds D5W @ KVO Replace electrolytes as needed.   F/u BMET Free water 250 ml Q4  GASTROINTESTINAL A:  Nutrition. Diarrhea >> C diff negative 3/24. P:   TF while on vent Protonix Pending PEG placement per IR 4/7   HEMATOLOGIC A:  Anemia of critical illness - s/p PRBC 4/5 x1 P:  F/u CBC SQ heparin for DVT prevention Anemia panel pending  Transfuse as needed for hgb <7  INFECTIOUS A:   Possible meningitis - suspect neurologic injury related to hypoglycemic event + prolonged period of downtime.  P:   Rocephin, vancomycin for 14 days total, completed 4/4  Blood 3/21 >> neg   ENDOCRINE A:  DM with renal complications. P:   SSI Hold lantus while NPO for PEG  Hold outpt pioglitazone, metformin  Will need LTAC v vent SNF.  CM following. For PEG placement today.   Dirk Dress, NP 12/21/2014  10:42 AM Pager: [phone] or [phone]   Attending Note:  I have examined patient, reviewed labs, studies and  notes. I have discussed the case with Jasper Riling, and I agree with the data and plans as amended above.   Levy Pupa, MD, PhD 12/21/2014, 2:34 PM [facility] [phone] or if no answer [phone]

## 2014-12-21 NOTE — Procedures (Signed)
Procedure:  Percutaneous gastrostomy tube placement Findings:  20 Fr bumper retention gastrostomy tube placed with tip in body of stomach. OK to use for feeds in 24 hours. No complications.  EBL <25 mL

## 2014-12-22 ENCOUNTER — Inpatient Hospital Stay (HOSPITAL_COMMUNITY): Payer: Medicare Other

## 2014-12-22 LAB — CBC
HEMATOCRIT: 25.1 % — AB (ref 36.0–46.0)
Hemoglobin: 7.8 g/dL — ABNORMAL LOW (ref 12.0–15.0)
MCH: 30 pg (ref 26.0–34.0)
MCHC: 31.1 g/dL (ref 30.0–36.0)
MCV: 96.5 fL (ref 78.0–100.0)
PLATELETS: 198 10*3/uL (ref 150–400)
RBC: 2.6 MIL/uL — ABNORMAL LOW (ref 3.87–5.11)
RDW: 14 % (ref 11.5–15.5)
WBC: 9.1 10*3/uL (ref 4.0–10.5)

## 2014-12-22 LAB — GLUCOSE, CAPILLARY
GLUCOSE-CAPILLARY: 226 mg/dL — AB (ref 70–99)
Glucose-Capillary: 232 mg/dL — ABNORMAL HIGH (ref 70–99)
Glucose-Capillary: 102 mg/dL — ABNORMAL HIGH (ref 70–99)
Glucose-Capillary: 186 mg/dL — ABNORMAL HIGH (ref 70–99)
Glucose-Capillary: 178 mg/dL — ABNORMAL HIGH (ref 70–99)
Glucose-Capillary: 218 mg/dL — ABNORMAL HIGH (ref 70–99)
Glucose-Capillary: 248 mg/dL — ABNORMAL HIGH (ref 70–99)
Glucose-Capillary: 71 mg/dL (ref 70–99)
Glucose-Capillary: 88 mg/dL (ref 70–99)

## 2014-12-22 LAB — BASIC METABOLIC PANEL
ANION GAP: 9 (ref 5–15)
BUN: 30 mg/dL — AB (ref 6–23)
CHLORIDE: 100 mmol/L (ref 96–112)
CO2: 29 mmol/L (ref 19–32)
Calcium: 8.5 mg/dL (ref 8.4–10.5)
Creatinine, Ser: 1.04 mg/dL (ref 0.50–1.10)
GFR, EST AFRICAN AMERICAN: 66 mL/min — AB (ref >90)
GFR, EST NON AFRICAN AMERICAN: 57 mL/min — AB (ref >90)
Glucose, Bld: 284 mg/dL — ABNORMAL HIGH (ref 70–99)
POTASSIUM: 4 mmol/L (ref 3.5–5.1)
SODIUM: 138 mmol/L (ref 135–145)

## 2014-12-22 MED ORDER — FREE WATER
200.0000 mL | Freq: Four times a day (QID) | Status: DC
  Administered 2014-12-22 – 2014-12-27 (×19): 200 mL

## 2014-12-22 MED ORDER — PRO-STAT SUGAR FREE PO LIQD
30.0000 mL | Freq: Two times a day (BID) | ORAL | Status: DC
  Administered 2014-12-22 – 2014-12-26 (×8): 30 mL
  Filled 2014-12-22 (×12): qty 30

## 2014-12-22 NOTE — Progress Notes (Signed)
RT placed PT on new ATC set up- uneventful. 

## 2014-12-22 NOTE — Progress Notes (Signed)
CSW received referral for New SNF; questionable if pt will need vent SNF as pt may need nocturnal vent support and ATC during day for baseline. Pt progress is being monitored by MD.   CSW contacted pt daughter, Jihan Mellette via telephone, but unable to reach at this time. CSW left voice message.  CSW to ask weekend CSW to continue attempts to reach pt daughter to discuss disposition planning and complete full psychosocial assessment when able to reach pt daughter.  CSW to continue to follow.  Loletta Specter, MSW, LCSW Clinical Social Work [phone]

## 2014-12-22 NOTE — Progress Notes (Signed)
Spoke with Dr Darrick Penna at [facility] regarding plan of care.  Per MD, ok to continue ATC tonight as tolerated.  RT will continue to monitor pt.  Vent remains in room on standby.  RN aware.

## 2014-12-22 NOTE — Progress Notes (Signed)
NUTRITION FOLLOW UP  Intervention:   Continue Vital High Protein via OGT goal rate of 40 ml/hr.   30 ml Pro-stat BID  Current TF regimen provides 1250 kcal (13 g/kg actual body weight), 114 g protein and 803 ml free water Total free water: 1603 ml  Nutrition Dx:   Inadequate oral intake related to inability to eat as evidenced by NPO status; ongoing.  Goal:   Enteral nutrition to provide 60-70% of estimated caloric needs (22-25 g/kg of ideal body weight) and 100% of estimated protein needs per ASPEN guideline of hypo caloric high protein feeding of critically ill obese individuals; met with TF  Monitor:   TF initiation/tolerance/adequacy, weight trends, labs, I/Os  Assessment:   Pt found unresponsive, brought to ED and required intubation. PMH significant for HTN, DM, polysubstance abuse.   Pt now off ventilator support and on trach collar only. Per RN not likely to return to vent, has been off x24 hrs.  PEG placed 4/7, TF restarted 4/8 Estimated needs and Tube feeding continue to be adequate.    Height: Ht Readings from Last 1 Encounters:  12/11/14 '5\' 4"'  (1.626 m)    Weight Status:   Wt Readings from Last 1 Encounters:  12/22/14 211 lb 13.8 oz (96.1 kg)    Re-estimated needs:  Kcal: 7416-3845 (11-14 kcal/kg actual body weight) Protein: >/= 110 g protein Fluid: >/= 1.6 L  Skin: intact  Diet Order: Diet NPO time specified   Intake/Output Summary (Last 24 hours) at 12/22/14 1304 Last data filed at 12/22/14 1100  Gross per 24 hour  Intake 1183.3 ml  Output   2150 ml  Net -966.7 ml    Last BM: 4/7 via rectal tube 350 ml 4/7 400 ml 4/6   Labs:   Recent Labs Lab 12/20/14 0545 12/21/14 0430 12/22/14 0410  NA 143 142 138  K 4.1 3.9 4.0  CL 104 103 100  CO2 '31 31 29  ' BUN 39* 39* 30*  CREATININE 1.19* 1.20* 1.04  CALCIUM 8.8 8.8 8.5  MG 1.6  --   --   PHOS 4.1  --   --   GLUCOSE 162* 234* 284*    CBG (last 3)   Recent Labs  12/22/14 0317  12/22/14 0756 12/22/14 0936  GLUCAP 186* 178* 218*    Scheduled Meds: . amLODipine  10 mg Per Tube Daily  . antiseptic oral rinse  7 mL Mouth Rinse QID  . chlorhexidine  15 mL Mouth Rinse BID  . etomidate  40 mg Intravenous Once  . feeding supplement (PRO-STAT SUGAR FREE 64)  30 mL Oral BID  . feeding supplement (VITAL HIGH PROTEIN)  1,000 mL Per Tube Q24H  . folic acid  1 mg Per Tube Daily  . free water  200 mL Per Tube 4 times per day  . insulin aspart  0-20 Units Subcutaneous 6 times per day  . insulin glargine  8 Units Subcutaneous BID  . lactulose  20 g Per Tube Daily  . metoprolol tartrate  100 mg Per Tube BID  . pantoprazole sodium  40 mg Per Tube Q24H  . thiamine  100 mg Per Tube Daily    Continuous Infusions: . dextrose 10 mL/hr at 12/22/14 Dillon, Alma, CNSC [phone] Pager [phone] After Hours Pager

## 2014-12-22 NOTE — Progress Notes (Signed)
PULMONARY / CRITICAL CARE MEDICINE   Name: Gina Cox MRN: [id number] DOB: 11-28-1955    ADMISSION DATE:  12/04/2014  REFERRING MD :  Ralene Bathe EDP  CHIEF COMPLAINT:  Found down  INITIAL PRESENTATION: 59 y/o female found unresponsive by family at home.  CBG 54 in ER, and intubated for airway protection >> Difficult airway.  STUDIES:  3/21 CT head >> no acute intracranial process 3/22 MRI Brain >> hippocampal diffusion signal abnormal to the posterior singulate gyrus  3/22 EEG >> no seizure activity 3/22 LP >> glucose 96, protein 42, WBC 128 (82%N), RBC 13 3/25 EEG >> toxic/metabolic encephalopathy.  No seizure activity 3/28 MRI Brain >> stable, abnormal brain MR with hyperintensity in limbic system affecting the hippocampus  SIGNIFICANT EVENTS: 3/21  Admit 3/22  Neurology consulted 3/24  ID consulted 3/26  acyclovir, ampicillin, decadron d/c'ed 4/01  Pt with new but minimal movement of fingers / toes on command 4/04  Tolerated PSV 5/5  SUBJECTIVE:  More alert but no follow commands, tolerating ATC since 11 am 4/7.  Temp 100 axillary, RN is going to recheck.  S/P PEG 4/7.  Note sputum cultures from 4/5.    VITAL SIGNS: Temp:  [98.1 F (36.7 C)-101.9 F (38.8 C)] 100.4 F (38 C) (04/08 0800) Pulse Rate:  [77-88] 78 (04/07 1653) Resp:  [12-30] 24 (04/08 0600) BP: (90-179)/(43-96) 149/55 mmHg (04/08 0600) SpO2:  [89 %-100 %] 97 % (04/08 0600) FiO2 (%):  [28 %-30 %] 28 % (04/07 2000) Weight:  [211 lb 13.8 oz (96.1 kg)] 211 lb 13.8 oz (96.1 kg) (04/08 0429)   VENTILATOR SETTINGS: Vent Mode:  [-]  FiO2 (%):  [28 %-30 %] 28 %   INTAKE / OUTPUT:  Intake/Output Summary (Last 24 hours) at 12/22/14 0939 Last data filed at 12/22/14 0600  Gross per 24 hour  Intake  943.3 ml  Output   2075 ml  Net -1131.7 ml   PHYSICAL EXAMINATION: General: no distress Neuro: Opens eyes to voice.  Moving around spontaneously, intermittently tracks.  HEENT:  mm pink moist, trach midline  c/d/i Cardiovascular: s1s2 regular Lungs: even/non-labored, coarse bilaterally, diminished Abdomen: soft, non tender Musculoskeletal:  Scant BLE edema  Skin: no rashes  LABS:  CBC  Recent Labs Lab 12/20/14 0545 12/21/14 0430 12/22/14 0410  WBC 9.3 8.2 9.1  HGB 7.2* 7.2* 7.8*  HCT 22.7* 22.8* 25.1*  PLT 168 177 198    BMET  Recent Labs Lab 12/20/14 0545 12/21/14 0430 12/22/14 0410  NA 143 142 138  K 4.1 3.9 4.0  CL 104 103 100  CO2 '31 31 29  ' BUN 39* 39* 30*  CREATININE 1.19* 1.20* 1.04  GLUCOSE 162* 234* 284*   Electrolytes  Recent Labs Lab 12/20/14 0545 12/21/14 0430 12/22/14 0410  CALCIUM 8.8 8.8 8.5  MG 1.6  --   --   PHOS 4.1  --   --       ABG No results for input(s): PHART, PCO2ART, PO2ART in the last 168 hours.   Liver Enzymes No results for input(s): AST, ALT, ALKPHOS, BILITOT, ALBUMIN in the last 168 hours.   Glucose  Recent Labs Lab 12/21/14 1203 12/21/14 1721 12/21/14 1946 12/21/14 2333 12/22/14 0317 12/22/14 0756  GLUCAP 183* 112* 125* 136* 186* 178*   Imaging Ir Gastrostomy Tube Mod Sed  12/21/2014   CLINICAL DATA:  Encephalopathy, respiratory failure, dysphagia and malnutrition. The patient requires a percutaneous gastrostomy tube for chronic nutritional needs.  EXAM: PERCUTANEOUS GASTROSTOMY TUBE  PLACEMENT  ANESTHESIA/SEDATION: 0.5 mg IV Versed; 25 mcg IV Fentanyl.  Total Moderate Sedation Time  9 minutes.  CONTRAST:  10 mL Omnipaque 300  MEDICATIONS: 2 g IV Ancef. As antibiotic prophylaxis, Ancef was ordered pre-procedure and administered intravenously within one hour of incision.  FLUOROSCOPY TIME:  3 minutes and 36 seconds.  PROCEDURE: The procedure, risks, benefits, and alternatives were explained to the patient's daughter. Questions regarding the procedure were encouraged and answered. The patient's daughter understands and consents to the procedure.  A 5-French catheter was then advanced through the the patient's mouth under  fluoroscopy into the esophagus and to the level of the stomach. This catheter was used to insufflate the stomach with air under fluoroscopy.  The abdominal wall was prepped with Betadine in a sterile fashion, and a sterile drape was applied covering the operative field. A sterile gown and sterile gloves were used for the procedure. Local anesthesia was provided with 1% Lidocaine.  A skin incision was made in the upper abdominal wall. Under fluoroscopy, an 18 gauge trocar needle was advanced into the stomach. Contrast injection was performed to confirm intraluminal position of the needle tip. A single T tack was then deployed in the lumen of the stomach. This was brought up to tension at the skin surface.  Over a guidewire, a 9-French sheath was advanced into the lumen of the stomach. The wire was left in place as a safety wire. A loop snare device from a percutaneous gastrostomy kit was then advanced into the stomach.  A floppy guide wire was advanced through the orogastric catheter under fluoroscopy in the stomach. The loop snare advanced through the percutaneous gastric access was used to snare the guide wire. This allowed withdrawal of the loop snare out of the patient's mouth by retraction of the orogastric catheter and wire.  A 20-French bumper retention gastrostomy tube was looped around the snare device. It was then pulled back through the patient's mouth. The retention bumper was brought up to the anterior gastric wall. The T tack suture was cut at the skin. The exiting gastrostomy tube was cut to appropriate length and a feeding adapter applied. The catheter was injected with contrast material to confirm position and a fluoroscopic spot image saved. The tube was then flushed with saline. A dressing was applied over the gastrostomy exit site.  COMPLICATIONS: None.  FINDINGS: Initial fluoroscopy demonstrates adequate opacification of the colon by ingested barium in order to prevent colonic injury during the  procedure. The stomach distended well with air allowing safe placement of the gastrostomy tube. After placement, the tip of the gastrostomy tube lies in the body of the stomach.  IMPRESSION: Percutaneous gastrostomy with placement of a 20-French bumper retention tube in the body of the stomach. This tube can be used for percutaneous feeds beginning in 24 hours after placement.   Electronically Signed   By: Aletta Edouard M.D.   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  Likely diabetic / hypoglycemic encephalopathy with AKI.  Slight improvement 4/7.  - Ehrlichia panel negative - RMSF negative - ANA and ANCA negative - CSF HSV negative Hx of ETOH, cocaine abuse. P:   Monitor mental status RASS goal 0 F/u anti-NMDAR, anti-AMPA >> negative  Continue lactulose Thiamine, folic acid Passive ROM  Suspect poor prognosis for meaningful recovery here.   PULMONARY OETT 3/21 >>>4/5 Trach (DF)  4/5>>> A:   Acute respiratory failure secondary to inability to protect airway >> difficult airway. Tobacco abuse. P:   Daily SBT, goal ATC  F/u CXR intermittently  Trach care per protocol   CARDIOVASCULAR A: Hx HTN, HLD. P:  Continue norvasc 10 mg  Metoprolol 100 mg bid  Hold outpt lisinopril, simvastatin, nebivolol Consider restarting lisinopril in am 4/9 - sr cr returned to normal, renal fxn stabilized   RENAL A:  AKI - creatinine plateau, now improving .  FENa 0.67 %, suggestive of pre-renal failure Gap/non gap metabolic acidosis >> resolved.  Hypokalemia. Hypernatremia P:   Renal dose adjust meds D5W @ KVO Replace electrolytes as needed.   F/u BMET Reduce free water to 200 ml Q6  GASTROINTESTINAL PEG 4/7 >>  A:  Nutrition. Diarrhea >> C diff negative 3/24. P:   TF while on vent Protonix PEG placement per IR 4/7   HEMATOLOGIC A:   Anemia of critical illness - s/p PRBC 4/5 x1 P:  F/u CBC SQ heparin for DVT prevention Anemia panel pending  Transfuse as needed for hgb <7  INFECTIOUS A:   Possible meningitis - suspect neurologic injury related to hypoglycemic event + prolonged period of downtime.  P:   Rocephin, vancomycin for 14 days total, completed 4/4  Blood 3/21 >> neg BAL 4/5 >> 75k coag neg staph BCx2 4/8 >>  HSV BAL 4/5 >>   Follow fever curve / WBC trend    ENDOCRINE A:  DM with renal complications. P:   SSI Lantus 8 units BID Hold outpt pioglitazone, metformin   GLOBAL:  Likely too well for LTAC given weaning progress.  Concern however, that she may need nocturnal vent support and ATC during day for baseline.  Will monitor progress.  CM looking for SNF vs LTAC placement     Noe Gens, NP-C [facility] Pgr: [phone] or [phone] 12/22/2014  9:39 AM   Attending Note:  I have examined patient, reviewed labs, studies and notes. I have discussed the case with B Ollis, and I agree with the data and plans as amended above. I discussed the case with her significant other with whom she has lived for last 6 years. Reviewed her status, lack of progress. Informed him that she will need to be institutionalized and followed for any slow improvement.   Baltazar Apo, MD, PhD 12/22/2014, 10:43 AM [facility] [phone] or if no answer [phone]

## 2014-12-22 NOTE — Progress Notes (Signed)
eLink Physician-Brief Progress Note Patient Name: Gina Cox DOB: [birth year] MRN: [id number]   Date of Service  12/22/2014  HPI/Events of Note  Fever Does not appear toxic Had coag neg staph from bal on 4/5, undetermined significance  eICU Interventions  Blood culture cxr Hold Abx for now     Intervention Category Intermediate Interventions: Infection - evaluation and management  MCQUAID, DOUGLAS 12/22/2014, 3:42 AM

## 2014-12-22 NOTE — Progress Notes (Signed)
Patient ID: Gina Cox, female   DOB: 09-04-1956, 59 y.o.   MRN: [id number]    Referring Physician(s): CCM  Subjective:  Pt still not F/C but little more active; sl temp elevation noted, currently 100.4  Allergies: Review of patient's allergies indicates no known allergies.  Medications: Prior to Admission medications   Medication Sig Start Date End Date Taking? Authorizing Provider  amLODipine (NORVASC) 5 MG tablet Take 5 mg by mouth daily.   Yes Historical Provider, MD  insulin glargine (LANTUS) 100 UNIT/ML injection Inject 45 Units into the skin at bedtime.    Yes Historical Provider, MD  lisinopril (PRINIVIL,ZESTRIL) 40 MG tablet Take 40 mg by mouth daily.   Yes Historical Provider, MD  pioglitazone-metformin (ACTOPLUS MET) 15-500 MG per tablet Take 2 tablets by mouth daily.    Yes Historical Provider, MD  simvastatin (ZOCOR) 20 MG tablet Take 20 mg by mouth daily.   Yes Historical Provider, MD  fluorometholone (FML) 0.1 % ophthalmic suspension 1 drop 4 (four) times daily. In Surgical eye 11/03/14   Historical Provider, MD  ketorolac (ACULAR) 0.4 % SOLN 1 drop 4 (four) times daily. On Tuesday, Wednesday, and thursday before surgery 11/03/14   Historical Provider, MD  nebivolol (BYSTOLIC) 5 MG tablet Take 5 mg by mouth daily.    Historical Provider, MD  ofloxacin (OCUFLOX) 0.3 % ophthalmic solution 1 drop 4 (four) times daily. Into surgical eye 11/03/14   Historical Provider, MD     Vital Signs: BP 112/40 mmHg  Pulse 78  Temp(Src) 100.4 F (38 C) (Axillary)  Resp 24  Ht _0  (1.626 m)  Wt 211 lb 13.8 oz (96.1 kg)  BMI 36.35 kg/m2  SpO2 97%  Physical Exam G tube intact, dressing dry, abd soft  Imaging: Ir Gastrostomy Tube Mod Sed  12/21/2014   CLINICAL DATA:  Encephalopathy, respiratory failure, dysphagia and malnutrition. The patient requires a percutaneous gastrostomy tube for chronic nutritional needs.  EXAM: PERCUTANEOUS GASTROSTOMY TUBE PLACEMENT   ANESTHESIA/SEDATION: 0.5 mg IV Versed; 25 mcg IV Fentanyl.  Total Moderate Sedation Time  9 minutes.  CONTRAST:  10 mL Omnipaque 300  MEDICATIONS: 2 g IV Ancef. As antibiotic prophylaxis, Ancef was ordered pre-procedure and administered intravenously within one hour of incision.  FLUOROSCOPY TIME:  3 minutes and 36 seconds.  PROCEDURE: The procedure, risks, benefits, and alternatives were explained to the patient's daughter. Questions regarding the procedure were encouraged and answered. The patient's daughter understands and consents to the procedure.  A 5-French catheter was then advanced through the the patient's mouth under fluoroscopy into the esophagus and to the level of the stomach. This catheter was used to insufflate the stomach with air under fluoroscopy.  The abdominal wall was prepped with Betadine in a sterile fashion, and a sterile drape was applied covering the operative field. A sterile gown and sterile gloves were used for the procedure. Local anesthesia was provided with 1% Lidocaine.  A skin incision was made in the upper abdominal wall. Under fluoroscopy, an 18 gauge trocar needle was advanced into the stomach. Contrast injection was performed to confirm intraluminal position of the needle tip. A single T tack was then deployed in the lumen of the stomach. This was brought up to tension at the skin surface.  Over a guidewire, a 9-French sheath was advanced into the lumen of the stomach. The wire was left in place as a safety wire. A loop snare device from a percutaneous gastrostomy kit was then advanced into the  stomach.  A floppy guide wire was advanced through the orogastric catheter under fluoroscopy in the stomach. The loop snare advanced through the percutaneous gastric access was used to snare the guide wire. This allowed withdrawal of the loop snare out of the patient's mouth by retraction of the orogastric catheter and wire.  A 20-French bumper retention gastrostomy tube was looped  around the snare device. It was then pulled back through the patient's mouth. The retention bumper was brought up to the anterior gastric wall. The T tack suture was cut at the skin. The exiting gastrostomy tube was cut to appropriate length and a feeding adapter applied. The catheter was injected with contrast material to confirm position and a fluoroscopic spot image saved. The tube was then flushed with saline. A dressing was applied over the gastrostomy exit site.  COMPLICATIONS: None.  FINDINGS: Initial fluoroscopy demonstrates adequate opacification of the colon by ingested barium in order to prevent colonic injury during the procedure. The stomach distended well with air allowing safe placement of the gastrostomy tube. After placement, the tip of the gastrostomy tube lies in the body of the stomach.  IMPRESSION: Percutaneous gastrostomy with placement of a 20-French bumper retention tube in the body of the stomach. This tube can be used for percutaneous feeds beginning in 24 hours after placement.   Electronically Signed   By: Aletta Edouard M.D.   On: 12/21/2014 17:29   Dg Chest Port 1 View  12/22/2014   CLINICAL DATA:  Fever.  EXAM: PORTABLE CHEST - 1 VIEW  COMPARISON:  12/20/2014.  FINDINGS: Tracheostomy tube and left IJ line stable position . Heart size stable. Low lung volumes with basilar atelectasis. No pleural effusion or pneumothorax. No acute osseous abnormality.  IMPRESSION: 1. Lines and tubes in stable position. 2. Stable chest. Low lung volumes with basilar subsegmental atelectasis.   Electronically Signed   By: Marcello Moores  Register   On: 12/22/2014 07:26   Dg Chest Port 1 View  12/20/2014   CLINICAL DATA:  Respiratory failure.  EXAM: PORTABLE CHEST - 1 VIEW  COMPARISON:  12/19/2014.  FINDINGS: Tracheostomy tube and NG tube in stable position. Left IJ line in stable position. Mediastinum hilar structures stable. Low lung volumes. Basilar atelectasis. Heart size stable. No pleural effusion or  pneumothorax. No acute osseous abnormality.  IMPRESSION: 1. Lines and tubes in stable position. 2. Low lung volumes with basilar atelectasis.   Electronically Signed   By: Marcello Moores  Register   On: 12/20/2014 07:08   Dg Chest Port 1 View  12/19/2014   CLINICAL DATA:  Tracheostomy in NG tube placement  EXAM: PORTABLE CHEST - 1 VIEW  COMPARISON:  12/18/2014  FINDINGS: Tracheostomy in satisfactory position, 3.5 cm above the carina.  Enteric tube terminates in the gastric body.  Left IJ venous catheter terminates in the mid SVC.  Lungs are clear.  No pleural effusion or pneumothorax.  The heart is top-normal in size.  IMPRESSION: Tracheostomy in satisfactory position, 3.5 cm above the carina.   Electronically Signed   By: Julian Hy M.D.   On: 12/19/2014 15:28    Labs:  CBC:  Recent Labs  12/19/14 0520 12/19/14 1733 12/20/14 0545 12/21/14 0430 12/22/14 0410  WBC 9.7  --  9.3 8.2 9.1  HGB 6.6* 7.6* 7.2* 7.2* 7.8*  HCT 21.0* 23.9* 22.7* 22.8* 25.1*  PLT 172  --  168 177 198    COAGS:  Recent Labs  12/04/14 2031 12/19/14 0750  INR 0.97 1.06  APTT  --  37    BMP:  Recent Labs  12/19/14 0520 12/20/14 0545 12/21/14 0430 12/22/14 0410  NA 142 143 142 138  K 3.8 4.1 3.9 4.0  CL 103 104 103 100  CO2 _0 GLUCOSE 151* 162* 234* 284*  BUN 37* 39* 39* 30*  CALCIUM 8.6 8.8 8.8 8.5  CREATININE 1.11* 1.19* 1.20* 1.04  GFRNONAA 52* 48* 48* 57*  GFRAA 61* 56* 55* 66*    LIVER FUNCTION TESTS:  Recent Labs  12/04/14 2031 12/09/14 1737 12/10/14 0315 12/13/14 0400  BILITOT 0.4  --  0.2* 0.2*  AST 25  --  34 120*  ALT 13  --  39* 157*  ALKPHOS 73  --  72 65  PROT 7.5  --  6.4 5.5*  ALBUMIN 3.8 2.4* 2.4*  2.5* 2.1*    Assessment and Plan:  S/p perc gastrostomy tube 4/7; ok to use tube; febrile; CXR stable; Hgb 7.8(7.2), blood cx's pend; other plans as per CCM  Signed: Lucius Wise,D KEVIN 12/22/2014, 9:52 AM   I spent a total of 15 minutes  in face to face in  clinical consultation/evaluation, greater than 50% of which was counseling/coordinating care for gastrostomy tube placement

## 2014-12-22 NOTE — Progress Notes (Signed)
Patient is alert and does not appear to be in any distress or pain. Was notified by NT that patient's axillary temperature was 100.4 this am. Notified NP at the bedside of patient's temperature and told her I would get a rectal to recheck. Rectal temp was 100.4. Paged NP and notified her of rectal temperature. Was told to continue to monitor and if temperature increases to call again. She has remained afebrile. CBG this afternoon was 71. CBG was rechecked again and was 88.

## 2014-12-23 ENCOUNTER — Inpatient Hospital Stay (HOSPITAL_COMMUNITY): Payer: Medicare Other

## 2014-12-23 DIAGNOSIS — J9620 Acute and chronic respiratory failure, unspecified whether with hypoxia or hypercapnia: Secondary | ICD-10-CM

## 2014-12-23 LAB — GLUCOSE, CAPILLARY
GLUCOSE-CAPILLARY: 139 mg/dL — AB (ref 70–99)
GLUCOSE-CAPILLARY: 145 mg/dL — AB (ref 70–99)
GLUCOSE-CAPILLARY: 233 mg/dL — AB (ref 70–99)
GLUCOSE-CAPILLARY: 210 mg/dL — AB (ref 70–99)
GLUCOSE-CAPILLARY: 109 mg/dL — AB (ref 70–99)
Glucose-Capillary: 200 mg/dL — ABNORMAL HIGH (ref 70–99)
Glucose-Capillary: 150 mg/dL — ABNORMAL HIGH (ref 70–99)
Glucose-Capillary: 241 mg/dL — ABNORMAL HIGH (ref 70–99)

## 2014-12-23 LAB — BASIC METABOLIC PANEL
Anion gap: 9 (ref 5–15)
BUN: 34 mg/dL — ABNORMAL HIGH (ref 6–23)
CALCIUM: 8.7 mg/dL (ref 8.4–10.5)
CHLORIDE: 99 mmol/L (ref 96–112)
CO2: 29 mmol/L (ref 19–32)
Creatinine, Ser: 1.06 mg/dL (ref 0.50–1.10)
GFR calc Af Amer: 64 mL/min — ABNORMAL LOW (ref >90)
GFR calc non Af Amer: 55 mL/min — ABNORMAL LOW (ref >90)
Glucose, Bld: 172 mg/dL — ABNORMAL HIGH (ref 70–99)
Potassium: 3.4 mmol/L — ABNORMAL LOW (ref 3.5–5.1)
Sodium: 137 mmol/L (ref 135–145)

## 2014-12-23 LAB — CBC
HCT: 25.4 % — ABNORMAL LOW (ref 36.0–46.0)
HEMOGLOBIN: 8 g/dL — AB (ref 12.0–15.0)
MCH: 30.2 pg (ref 26.0–34.0)
MCHC: 31.5 g/dL (ref 30.0–36.0)
MCV: 95.8 fL (ref 78.0–100.0)
PLATELETS: 226 10*3/uL (ref 150–400)
RBC: 2.65 MIL/uL — ABNORMAL LOW (ref 3.87–5.11)
RDW: 14 % (ref 11.5–15.5)
WBC: 9.3 10*3/uL (ref 4.0–10.5)

## 2014-12-23 NOTE — Progress Notes (Signed)
PULMONARY / CRITICAL CARE MEDICINE   Name: Gina Cox MRN: [id number] DOB: 1956-07-16    ADMISSION DATE:  12/04/2014  REFERRING MD :  Madilyn Hook EDP  CHIEF COMPLAINT:  Found down  INITIAL PRESENTATION: 59 y/o female found unresponsive by family at home.  CBG 54 in ER, and intubated for airway protection >> Difficult airway.  STUDIES:  3/21 CT head >> no acute intracranial process 3/22 MRI Brain >> hippocampal diffusion signal abnormal to the posterior singulate gyrus  3/22 EEG >> no seizure activity 3/22 LP >> glucose 96, protein 42, WBC 128 (82%N), RBC 13 3/25 EEG >> toxic/metabolic encephalopathy.  No seizure activity 3/28 MRI Brain >> stable, abnormal brain MR with hyperintensity in limbic system affecting the hippocampus  SIGNIFICANT EVENTS: 3/21  Admit 3/22  Neurology consulted 3/24  ID consulted 3/26  acyclovir, ampicillin, decadron d/c'ed 4/01  Pt with new but minimal movement of fingers / toes on command 4/04  Tolerated PSV 5/5 4/8:SUBJECTIVE:  More alert but no follow commands, tolerating ATC since 11 am 4/7.  Temp 100 axillary, RN is going to recheck.  S/P PEG 4/7.  Note sputum cultures from 4/5.     SUBJECTIVE/OVERNIGHT/INTERVAL HX 12/23/14: No change. Eyes open. On ATC  VITAL SIGNS: Temp:  [98.7 F (37.1 C)-99.9 F (37.7 C)] 99.4 F (37.4 C) (04/09 0400) Pulse Rate:  [82-88] 83 (04/09 0804) Resp:  [16-34] 25 (04/09 1000) BP: (126-159)/(56-113) 142/62 mmHg (04/09 1000) SpO2:  [93 %-98 %] 96 % (04/09 1000) FiO2 (%):  [28 %] 28 % (04/09 0804) Weight:  [95.8 kg (211 lb 3.2 oz)] 95.8 kg (211 lb 3.2 oz) (04/09 0357)   VENTILATOR SETTINGS: Vent Mode:  [-]  FiO2 (%):  [28 %] 28 %   INTAKE / OUTPUT:  Intake/Output Summary (Last 24 hours) at 12/23/14 1111 Last data filed at 12/23/14 1000  Gross per 24 hour  Intake   1270 ml  Output   2910 ml  Net  -1640 ml   PHYSICAL EXAMINATION: General: no distress Neuro: Opens eyes to voice.  Moving around spontaneously,  intermittently tracks.  HEENT:  mm pink moist, trach midline c/d/i Cardiovascular: s1s2 regular Lungs: even/non-labored, coarse bilaterally, diminished Abdomen: soft, non tender Musculoskeletal:  Scant BLE edema  Skin: no rashes  LABS:  CBC  Recent Labs Lab 12/21/14 0430 12/22/14 0410 12/23/14 0622  WBC 8.2 9.1 9.3  HGB 7.2* 7.8* 8.0*  HCT 22.8* 25.1* 25.4*  PLT 177 198 226    BMET  Recent Labs Lab 12/21/14 0430 12/22/14 0410 12/23/14 0622  NA 142 138 137  K 3.9 4.0 3.4*  CL 103 100 99  CO2 BUN 39* 30* 34*  CREATININE 1.20* 1.04 1.06  GLUCOSE 234* 284* 172*   Electrolytes  Recent Labs Lab 12/20/14 0545 12/21/14 0430 12/22/14 0410 12/23/14 0622  CALCIUM 8.8 8.8 8.5 8.7  MG 1.6  --   --   --   PHOS 4.1  --   --   --       ABG No results for input(s): PHART, PCO2ART, PO2ART in the last 168 hours.   Liver Enzymes No results for input(s): AST, ALT, ALKPHOS, BILITOT, ALBUMIN in the last 168 hours.   Glucose  Recent Labs Lab 12/22/14 1208 12/22/14 1618 12/22/14 1759 12/22/14 2102 12/22/14 2327 12/23/14 0401  GLUCAP 248* 71 88 139* 200* 150*   Imaging Dg Chest Port 1 View  12/22/2014   CLINICAL DATA:  Fever.  EXAM:  PORTABLE CHEST - 1 VIEW  COMPARISON:  12/20/2014.  FINDINGS: Tracheostomy tube and left IJ line stable position . Heart size stable. Low lung volumes with basilar atelectasis. No pleural effusion or pneumothorax. No acute osseous abnormality.  IMPRESSION: 1. Lines and tubes in stable position. 2. Stable chest. Low lung volumes with basilar subsegmental atelectasis.   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  Likely diabetic / hypoglycemic encephalopathy with AKI.  Slight improvement 4/7. But similar on 12/23/14 - Ehrlichia panel negative -  RMSF negative - ANA and ANCA negative - CSF HSV negative  Hx of ETOH, cocaine abuse. P:   Monitor mental status RASS goal 0 F/u anti-NMDAR, anti-AMPA >> negative  Continue lactulose Thiamine, folic acid Passive ROM  Suspect poor prognosis for meaningful recovery here.   PULMONARY OETT 3/21 >>>4/5 Trach (DF) 4/5>>> A:   Acute respiratory failure secondary to inability to protect airway >> difficult airway. Tobacco abuse.   - on ATC P:   Daily SBT, goal ATC  F/u CXR intermittently  Trach care per protocol   CARDIOVASCULAR A: Hx HTN, HLD. P:  Continue norvasc 10 mg  Metoprolol 100 mg bid  Hold outpt lisinopril, simvastatin, nebivolol Consider restarting lisinopril in am 4/9 - sr cr returned to normal, renal fxn stabilized   RENAL A:  AKI - creatinine plateau, now improving .  FENa 0.67 %, suggestive of pre-renal failure Gap/non gap metabolic acidosis >> resolved.   P:   Renal dose adjust meds D5W @ KVO Replace electrolytes as needed.   F/u BMET Reduce free water to 200 ml Q6  GASTROINTESTINAL PEG 4/7 >>  A:  Nutrition. Diarrhea >> C diff negative 3/24. P:   TF while on vent Protonix PEG placement per IR 4/7   HEMATOLOGIC A:  Anemia of critical illness - s/p PRBC 4/5 x1 P:  F/u CBC SQ heparin for DVT prevention Anemia panel pending  Transfuse as needed for hgb <7  INFECTIOUS A:   Possible meningitis - suspect neurologic injury related to hypoglycemic event + prolonged period of downtime.  P:   Rocephin, vancomycin for 14 days total, completed 4/4  Blood 3/21 >> neg BAL 4/5 >> 75k coag neg staph BCx2 4/8 >>  HSV BAL 4/5 >>   Follow fever curve / WBC trend    ENDOCRINE A:  DM with renal complications. P:   SSI Lantus 8 units BID Hold outpt pioglitazone, metformin   GLOBAL:  Likely too well for LTAC given weaning progress.  Concern however, that she may need nocturnal vent support and ATC during day for baseline.  Will monitor  progress.  CM looking for SNF vs LTAC placement     Dr. Kalman Shan, M.D., [facility].C.P Pulmonary and Critical Care Medicine Staff Physician [facility] Pager: [phone], If no answer or between  15:00h - 7:00h: call [phone]  12/23/2014 11:16 AM

## 2014-12-24 DIAGNOSIS — J9620 Acute and chronic respiratory failure, unspecified whether with hypoxia or hypercapnia: Secondary | ICD-10-CM

## 2014-12-24 LAB — GLUCOSE, CAPILLARY
GLUCOSE-CAPILLARY: 236 mg/dL — AB (ref 70–99)
GLUCOSE-CAPILLARY: 244 mg/dL — AB (ref 70–99)
GLUCOSE-CAPILLARY: 199 mg/dL — AB (ref 70–99)
GLUCOSE-CAPILLARY: 182 mg/dL — AB (ref 70–99)
Glucose-Capillary: 226 mg/dL — ABNORMAL HIGH (ref 70–99)
Glucose-Capillary: 227 mg/dL — ABNORMAL HIGH (ref 70–99)
Glucose-Capillary: 129 mg/dL — ABNORMAL HIGH (ref 70–99)

## 2014-12-24 MED ORDER — VANCOMYCIN HCL IN DEXTROSE 1-5 GM/200ML-% IV SOLN
1000.0000 mg | Freq: Once | INTRAVENOUS | Status: AC
Start: 2014-12-24 — End: 2014-12-24
  Administered 2014-12-24: 1000 mg via INTRAVENOUS
  Filled 2014-12-24: qty 200

## 2014-12-24 MED ORDER — INSULIN GLARGINE 100 UNIT/ML ~~LOC~~ SOLN
8.0000 [IU] | Freq: Two times a day (BID) | SUBCUTANEOUS | Status: DC
  Administered 2014-12-24 (×2): 8 [IU] via SUBCUTANEOUS
  Filled 2014-12-24 (×4): qty 0.08

## 2014-12-24 MED ORDER — SODIUM CHLORIDE 0.9 % IV SOLN
INTRAVENOUS | Status: DC
  Administered 2014-12-24: 11:00:00 via INTRAVENOUS

## 2014-12-24 MED ORDER — INSULIN GLARGINE 100 UNIT/ML ~~LOC~~ SOLN: 12.0000 [IU] | Freq: Two times a day (BID) | SUBCUTANEOUS | Status: DC

## 2014-12-24 MED ORDER — VANCOMYCIN HCL 500 MG IV SOLR
500.0000 mg | Freq: Two times a day (BID) | INTRAVENOUS | Status: DC
  Administered 2014-12-25 – 2014-12-27 (×5): 500 mg via INTRAVENOUS
  Filled 2014-12-24 (×6): qty 500

## 2014-12-24 MED ORDER — VANCOMYCIN HCL IN DEXTROSE 1-5 GM/200ML-% IV SOLN: 1000.0000 mg | Freq: Two times a day (BID) | INTRAVENOUS | Status: DC

## 2014-12-24 NOTE — Clinical Social Work Psychosocial (Signed)
Clinical Social Work Department BRIEF PSYCHOSOCIAL ASSESSMENT 12/24/2014  Patient:  Gina Cox, Gina Cox     Account Number:  [id number]     Admit date:  12/04/2014  Clinical Social Worker:  Elray Buba, CLINICAL SOCIAL WORKER  Date/Time:  12/24/2014 02:19 PM  Referred by:  Physician  Date Referred:  12/23/2014 Referred for  SNF Placement   Other Referral:   Interview type:  Family Other interview type:   Daughter Gladys    PSYCHOSOCIAL DATA Living Status:  ALONE Admitted from facility:   Level of care:   Primary support name:  Gladys Primary support relationship to patient:  CHILD, ADULT Degree of support available:   High/ daughter has been taking care of pt's bills, taking her shopping and making sure she gets to her medical appointments    CURRENT CONCERNS  Other Concerns:    SOCIAL WORK ASSESSMENT / PLAN CSW reviewed pt chart that reflected pt was non responsive. CSW called and spoke with pt's daughter to discuss SNF options.  CSW explained role and provided a description of the SNF process and facilities.  CSW encouraged pt's daughter to discuss pt history and current needs.  CSW encouraged pt's daughter to discuss her thoughts and feelings related to pt's diagnoses.  CSW provided active and supportive listening   Assessment/plan status:  Psychosocial Support/Ongoing Assessment of Needs Other assessment/ plan:   send pt's clinical information to SNF's in [place]   Information/referral to community resources:    PATIENT'S/FAMILY'S RESPONSE TO PLAN OF CARE: Pt's daughter discussed pt living alone prior to hospitalization and that "she wont be living alone anymore".  Pt's daughter discussed pt "not responding to commands and squirming like she want to move but can't". Pt's daughter stated that pt has no rehab history but would like pt to get all the help she can at a rehab facility at discharge.  Pt's daughter is only child and making all of pt decisions alone.  Pt's  daughter worried about pt's recovery but hopeful stating she saw pt yesterday and she was "breathing on her own."  Pt's daughter grateful for the help with securing a SNF for her mother    .Elray Buba, LCSW [facility] Clinical Social Worker - Weekend Coverage cell #: [phone]

## 2014-12-24 NOTE — Clinical Social Work Placement (Addendum)
Clinical Social Work Department CLINICAL SOCIAL WORK PLACEMENT NOTE 12/24/2014  Patient:  Gina Cox, Gina Cox  Account Number:  [id number] Admit date:  12/04/2014  Clinical Social Worker:  Elray Buba, CLINICAL SOCIAL WORKER  Date/time:  12/24/2014 02:43 PM  Clinical Social Work is seeking post-discharge placement for this patient at the following level of care:   SKILLED NURSING   (*CSW will update this form in Epic as items are completed)   12/24/2014  Patient/family provided with [facility] Department of Clinical Social Work's list of facilities offering this level of care within the geographic area requested by the patient (or if unable, by the patient's family).  12/24/2014  Patient/family informed of their freedom to choose among providers that offer the needed level of care, that participate in Medicare, Medicaid or managed care program needed by the patient, have an available bed and are willing to accept the patient.  12/24/2014  Patient/family informed of [facility]' ownership interest in [facility], as well as of the fact that they are under no obligation to receive care at this facility.  PASARR submitted to EDS on 12/25/2014 PASARR number received on 12/25/2014  FL2 transmitted to all facilities in geographic area requested by pt/family on  12/25/2014 FL2 transmitted to all facilities within larger geographic area on   Patient informed that his/her managed care company has contracts with or will negotiate with  certain facilities, including the following:     Patient/family informed of bed offers received:  12/25/2014 Patient chooses bed at [facility] Physician recommends and patient chooses bed at    Patient to be transferred to  on   Patient to be transferred to facility by  Patient and family notified of transfer on  Name of family member notified:    The following physician request were entered in Epic:   Additional  Comments:  .Elray Buba, LCSW [facility] Clinical Social Worker - Weekend Coverage cell #: [phone]

## 2014-12-24 NOTE — Progress Notes (Addendum)
PULMONARY / CRITICAL CARE MEDICINE   Name: Gina Cox MRN: [id number] DOB: 07-Jul-1956    ADMISSION DATE:  12/04/2014  REFERRING MD :  Madilyn Hook EDP  CHIEF COMPLAINT:  Found down  INITIAL PRESENTATION: 59 y/o female found unresponsive by family at home.  CBG 54 in ER, and intubated for airway protection >> Difficult airway.  STUDIES:  3/21 CT head >> no acute intracranial process 3/22 MRI Brain >> hippocampal diffusion signal abnormal to the posterior singulate gyrus  3/22 EEG >> no seizure activity 3/22 LP >> glucose 96, protein 42, WBC 128 (82%N), RBC 13 3/25 EEG >> toxic/metabolic encephalopathy.  No seizure activity 3/28 MRI Brain >> stable, abnormal brain MR with hyperintensity in limbic system affecting the hippocampus  SIGNIFICANT EVENTS: 3/21  Admit 3/22  Neurology consulted 3/24  ID consulted 3/26  acyclovir, ampicillin, decadron d/c'ed 4/01  Pt with new but minimal movement of fingers / toes on command 4/04  Tolerated PSV 5/5 4/8::  More alert but no follow commands, tolerating ATC since 11 am 4/7.  Temp 100 axillary, RN is going to recheck.  S/P PEG 4/7.  Note sputum cultures from 4/5.   12/23/14: No change. Eyes open. On ATC    SUBJECTIVE/OVERNIGHT/INTERVAL HX 12/24/14: remains on ATC since 12/21/14 all 24h. Eyes open but does not track or follow commands - no change. RN reports hyperglycemia 200s - patient on D4 drip  VITAL SIGNS: Temp:  [98.3 F (36.8 C)-99.6 F (37.6 C)] 99.2 F (37.3 C) (04/10 0800) Pulse Rate:  [74-87] 79 (04/10 0744) Resp:  [16-28] 20 (04/10 0744) BP: (107-147)/(44-70) 142/70 mmHg (04/10 0744) SpO2:  [95 %-99 %] 97 % (04/10 0744) FiO2 (%):  [28 %] 28 % (04/10 0744) Weight:  [95.2 kg (209 lb 14.1 oz)] 95.2 kg (209 lb 14.1 oz) (04/10 0500)   VENTILATOR SETTINGS: Vent Mode:  [-]  FiO2 (%):  [28 %] 28 %   INTAKE / OUTPUT:  Intake/Output Summary (Last 24 hours) at 12/24/14 0925 Last data filed at 12/24/14 0900  Gross per 24 hour  Intake    1850 ml  Output   1970 ml  Net   -120 ml   PHYSICAL EXAMINATION: General: no distress Neuro: Opens eyes spontaneously, Not following commands,   Moving around spontaneously, not tracking HEENT:  mm pink moist, trach midline c/d/i Cardiovascular: s1s2 regular Lungs: even/non-labored, coarse bilaterally, diminished Abdomen: soft, non tender Musculoskeletal:  Scant BLE edema  Skin: no rashes  LABS:  CBC  Recent Labs Lab 12/21/14 0430 12/22/14 0410 12/23/14 0622  WBC 8.2 9.1 9.3  HGB 7.2* 7.8* 8.0*  HCT 22.8* 25.1* 25.4*  PLT 177 198 226    BMET  Recent Labs Lab 12/21/14 0430 12/22/14 0410 12/23/14 0622  NA 142 138 137  K 3.9 4.0 3.4*  CL 103 100 99  CO2 BUN 39* 30* 34*  CREATININE 1.20* 1.04 1.06  GLUCOSE 234* 284* 172*   Electrolytes  Recent Labs Lab 12/20/14 0545 12/21/14 0430 12/22/14 0410 12/23/14 0622  CALCIUM 8.8 8.8 8.5 8.7  MG 1.6  --   --   --   PHOS 4.1  --   --   --       ABG No results for input(s): PHART, PCO2ART, PO2ART in the last 168 hours.   Liver Enzymes No results for input(s): AST, ALT, ALKPHOS, BILITOT, ALBUMIN in the last 168 hours.   Glucose  Recent Labs Lab 12/23/14 1139 12/23/14 1247 12/23/14  1537 12/23/14 1931 12/23/14 2319 12/24/14 0504  GLUCAP 233* 210* 241* 109* 236* 244*   Imaging Dg Chest Port 1 View  12/23/2014   CLINICAL DATA:  Respiratory failure.  EXAM: PORTABLE CHEST - 1 VIEW  COMPARISON:  12/22/2014  FINDINGS: Tracheostomy tube unchanged. Left IJ central venous catheter has tip overlying the region of the SVC.  Lungs are hypoinflated without consolidation or effusion. Cardiomediastinal silhouette and remainder of the exam is unchanged.  IMPRESSION: Hypoinflation without acute cardiopulmonary disease.  Tubes and lines unchanged.   Electronically Signed   By: Elberta Fortis M.D.   On: 12/23/2014 08:04   ASSESSMENT / PLAN:  NEUROLOGIC A:   #Baselien Hx of ETOH, cocaine abuse.  #Admit  isue Acute encephalopathy - considered HSV encephalitis, meningitis, metabolic (hypoglycemia on admission, elevated ammonia, AKI).  Likely diabetic / hypoglycemic encephalopathy with AKI.     - Slight improvement 4/7. But similar on 12/23/14 and 12/24/14. Looking more like hypoglycemic encephalopathy - Ehrlichia panel negative - RMSF negative - ANA and ANCA negative - CSF HSV negative -  anti-NMDAR, anti-AMPA >> negative   P:   Monitor mental status RASS goal 0 Continue lactulose Thiamine, folic acid Passive ROM  Suspect poor prognosis for meaningful recovery here.   PULMONARY OETT 3/21 >>>4/5 Trach (DF) 4/5>>> A:   Acute respiratory failure secondary to inability to protect airway >> difficult airway. Tobacco abuse.   - on ATC sinc 12/21/14 P:   Continue ATC F/u CXR intermittently  Trach care per protocol   CARDIOVASCULAR A: Hx HTN, HLD. P:  Continue norvasc 10 mg  Metoprolol 100 mg bid  Hold outpt lisinopril, simvastatin, nebivolol Proably avoid lisinopril due to ATN at admit but if need to restart can do under monitored setting  RENAL A:  AKI - creatinine plateau, now improving .  FENa 0.67 %, suggestive of pre-renal failure Gap/non gap metabolic acidosis >> resolved.   P:   Renal dose adjust meds D5W @ KVO chagne to Normal saline kvo Replace electrolytes as needed.   F/u BMET free water to 200 ml Q6  GASTROINTESTINAL PEG 4/7 >>  A:  Nutrition. Diarrhea >> C diff negative 3/24. P:   TF via PEG Protonix   HEMATOLOGIC A:  Anemia of critical illness - s/p PRBC 12/19/14 x1 P:  F/u CBC SQ heparin for DVT prevention Anemia panel pending  Transfuse as needed for hgb <7  INFECTIOUS Blood 3/21 >> neg BAL 4/5 >> 75k coag neg staph BCx2 4/8 >>  HSV BAL 4/5 >>   A:   Possible meningitis - suspect neurologic injury related to hypoglycemic event + prolonged period of downtime.  S/p Rocephin, vancomycin for 14 days total, completed 4/4   - on /410/16: No  fever  P Follow fever curve / WBC trend  Monitor off abx  ENDOCRINE A:  DM with renal complications.   - having hyperglycemia 12/24/14  P:   Change d5w to kvo Avoid hypoglycemia given encephalopathy SSI Lantus 8 units BID Hold outpt pioglitazone, metformin   GLOBAL:  Chagne to sDU status.  CM looking for SNF vs LTAC placement  Now on ATC since 12/21/14. Do labs 1-2 times per week only. No family at bedside 12/24/2014     Dr. Kalman Shan, M.D., [facility].C.P Pulmonary and Critical Care Medicine Staff Physician [facility] Pager: [phone], If no answer or between  15:00h - 7:00h: call [phone]  12/24/2014 9:25  AM    

## 2014-12-24 NOTE — Progress Notes (Signed)
ANTIBIOTIC CONSULT NOTE - INITIAL  Pharmacy Consult for Vancomycin (resume) Indication: GPC bacteremia  No Known Allergies  Patient Measurements: Height:  (162.6 cm) Weight: 209 lb 14.1 oz (95.2 kg) IBW/kg (Calculated) : 54.7  Vital Signs: Temp: 99.4 F (37.4 C) (04/10 1200) Temp Source: Oral (04/10 1200) BP: 137/59 mmHg (04/10 1200) Pulse Rate: 79 (04/10 1131) Intake/Output from previous day: 04/09 0701 - 04/10 0700 In: 2010 [I.V.:250; NG/GT:1760] Out: 2045 [Urine:1675; Stool:370] Intake/Output from this shift: Total I/O In: 290 [I.V.:50; NG/GT:240] Out: 605 [Urine:605]  Labs:  Recent Labs  12/22/14 0410 12/23/14 0622  WBC 9.1 9.3  HGB 7.8* 8.0*  PLT 198 226  CREATININE 1.04 1.06   Estimated Creatinine Clearance: 62.4 mL/min (by C-G formula based on Cr of 1.06). No results for input(s): VANCOTROUGH, VANCOPEAK, VANCORANDOM, GENTTROUGH, GENTPEAK, GENTRANDOM, TOBRATROUGH, TOBRAPEAK, TOBRARND, AMIKACINPEAK, AMIKACINTROU, AMIKACIN in the last 72 hours.   Microbiology: Recent Results (from the past 720 hour(s))  Blood Culture (routine x 2)     Status: None   Collection Time: 12/04/14  8:10 PM  Result Value Ref Range Status   Specimen Description BLOOD RIGHT WRIST  Final   Special Requests BOTTLES DRAWN AEROBIC AND ANAEROBIC 5CC EACH  Final   Culture   Final    NO GROWTH 5 DAYS Performed at [facility]    Report Status 12/11/2014 FINAL  Final  Blood Culture (routine x 2)     Status: None   Collection Time: 12/04/14  8:11 PM  Result Value Ref Range Status   Specimen Description BLOOD LEFT ARM  Final   Special Requests BOTTLES DRAWN AEROBIC AND ANAEROBIC 5CC EACH  Final   Culture   Final    NO GROWTH 5 DAYS Performed at [facility]    Report Status 12/11/2014 FINAL  Final  Urine culture     Status: None   Collection Time: 12/04/14  9:31 PM  Result Value Ref Range Status   Specimen Description URINE, CLEAN CATCH  Final   Special  Requests NONE  Final   Colony Count   Final    >=100,000 COLONIES/ML Performed at [facility]    Culture   Final    Multiple bacterial morphotypes present, none predominant. Suggest appropriate recollection if clinically indicated. Performed at [facility]    Report Status 12/05/2014 FINAL  Final  MRSA PCR Screening     Status: None   Collection Time: 12/05/14  4:23 AM  Result Value Ref Range Status   MRSA by PCR NEGATIVE NEGATIVE Final    Comment:        The GeneXpert MRSA Assay (FDA approved for NASAL specimens only), is one component of a comprehensive MRSA colonization surveillance program. It is not intended to diagnose MRSA infection nor to guide or monitor treatment for MRSA infections.   CSF culture     Status: None   Collection Time: 12/06/14 11:25 AM  Result Value Ref Range Status   Specimen Description CSF  Final   Special Requests Normal  Final   Gram Stain   Final    NO WBC SEEN NO ORGANISMS SEEN CYTOSPIN SLIDE Performed at [facility]    Culture   Final    NO GROWTH 3 DAYS Performed at [facility]    Report Status 12/15/2014 FINAL  Final  Clostridium Difficile by PCR     Status: None   Collection Time: 12/07/14  4:28 PM  Result Value Ref Range Status  C difficile by pcr NEGATIVE NEGATIVE Final  Culture, bal-quantitative     Status: None   Collection Time: 12/19/14  3:26 PM  Result Value Ref Range Status   Specimen Description BRONCHIAL ALVEOLAR LAVAGE  Final   Special Requests Normal  Final   Gram Stain   Final    RARE WBC PRESENT, PREDOMINANTLY PMN RARE SQUAMOUS EPITHELIAL CELLS PRESENT RARE GRAM POSITIVE COCCI IN PAIRS Performed at [facility]   Final    75,000 COLONIES/ML Performed at [facility]    Culture   Final    MODERATE STAPHYLOCOCCUS SPECIES (COAGULASE NEGATIVE) Performed at [facility]    Report Status 12/21/2014 FINAL  Final  Culture, blood  (routine x 2)     Status: None (Preliminary result)   Collection Time: 12/22/14  5:20 AM  Result Value Ref Range Status   Specimen Description BLOOD LEFT HAND  Final   Special Requests BOTTLES DRAWN AEROBIC AND ANAEROBIC  Final   Culture   Final           BLOOD CULTURE RECEIVED NO GROWTH TO DATE CULTURE WILL BE HELD FOR 5 DAYS BEFORE ISSUING A FINAL NEGATIVE REPORT Performed at [facility]    Report Status PENDING  Incomplete  Culture, blood (routine x 2)     Status: None (Preliminary result)   Collection Time: 12/22/14  5:30 AM  Result Value Ref Range Status   Specimen Description BLOOD RIGHT HAND  Final   Special Requests BOTTLES DRAWN AEROBIC AND ANAEROBIC 5CC  Final   Culture   Final    GRAM POSITIVE COCCI IN CLUSTERS Note: Gram Stain Report Called to,Read Back By and Verified With: Caryn Section RN 12/24/14 AT 1200 BY [facility] Performed at [facility]    Report Status PENDING  Incomplete    Medications:  Anti-infectives    Start     Dose/Rate Route Frequency Ordered Stop   12/24/14 1600  vancomycin (VANCOCIN) IVPB 1000 mg/200 mL premix     1,000 mg 200 mL/hr over 60 Minutes Intravenous Every 12 hours 12/24/14 1542     12/21/14 1602  ceFAZolin (ANCEF) 2-3 GM-% IVPB SOLR    Comments:  Hunt, Rebecca   : cabinet override      12/21/14 1602 12/21/14 1621   12/21/14 1100  ceFAZolin (ANCEF) IVPB 2 g/50 mL premix     2 g 100 mL/hr over 30 Minutes Intravenous On call 12/20/14 1415 12/21/14 1647   12/16/14 1000  vancomycin (VANCOCIN) IVPB 1000 mg/200 mL premix     1,000 mg 200 mL/hr over 60 Minutes Intravenous Every 24 hours 12/16/14 0942 12/17/14 1202   12/15/14 1000  vancomycin (VANCOCIN) IVPB 750 mg/150 ml premix     750 mg 150 mL/hr over 60 Minutes Intravenous  Once 12/15/14 0919 12/15/14 1030   12/14/14 1000  vancomycin (VANCOCIN) IVPB 750 mg/150 ml premix     750 mg 150 mL/hr over 60 Minutes Intravenous  Once 12/14/14 0927 12/14/14 1150   12/13/14 0945   vancomycin (VANCOCIN) IVPB 1000 mg/200 mL premix     1,000 mg 200 mL/hr over 60 Minutes Intravenous  Once 12/13/14 0944 12/13/14 1124   12/13/14 0900  vancomycin (VANCOCIN) IVPB 1000 mg/200 mL premix  Status:  Discontinued     1,000 mg 200 mL/hr over 60 Minutes Intravenous  Once 12/13/14 0835 12/13/14 0941   12/11/14 0700  vancomycin (VANCOCIN) 1,500 mg in sodium chloride 0.9 %  500 mL IVPB  Status:  Discontinued     1,500 mg 250 mL/hr over 120 Minutes Intravenous Every 48 hours 12/11/14 0609 12/12/14 1242   12/08/14 1800  acyclovir (ZOVIRAX) 550 mg in dextrose 5 % 100 mL IVPB  Status:  Discontinued     550 mg 111 mL/hr over 60 Minutes Intravenous Every 24 hours 12/07/14 2243 12/09/14 1053   12/08/14 0600  ampicillin (OMNIPEN) 2 g in sodium chloride 0.9 % 50 mL IVPB  Status:  Discontinued     2 g 150 mL/hr over 20 Minutes Intravenous 3 times per day 12/07/14 2243 12/09/14 1053   12/07/14 1800  acyclovir (ZOVIRAX) 550 mg in dextrose 5 % 100 mL IVPB  Status:  Discontinued     550 mg 111 mL/hr over 60 Minutes Intravenous Every 12 hours 12/07/14 1253 12/07/14 2243   12/07/14 1400  ampicillin (OMNIPEN) 2 g in sodium chloride 0.9 % 50 mL IVPB  Status:  Discontinued     2 g 150 mL/hr over 20 Minutes Intravenous Every 6 hours 12/07/14 1253 12/07/14 2243   12/07/14 1000  vancomycin (VANCOCIN) 500 mg in sodium chloride 0.9 % 100 mL IVPB  Status:  Discontinued     500 mg 100 mL/hr over 60 Minutes Intravenous Every 12 hours 12/06/14 2227 12/07/14 1255   12/05/14 2000  cefTRIAXone (ROCEPHIN) 2 g in dextrose 5 % 50 mL IVPB  Status:  Discontinued     2 g 100 mL/hr over 30 Minutes Intravenous Every 24 hours 12/04/14 2022 12/05/14 0051   12/05/14 1000  vancomycin (VANCOCIN) IVPB 750 mg/150 ml premix     750 mg 150 mL/hr over 60 Minutes Intravenous Every 12 hours 12/05/14 0105 12/06/14 2329   12/05/14 0800  cefTRIAXone (ROCEPHIN) 2 g in dextrose 5 % 50 mL IVPB - Premix     2 g 100 mL/hr over 30 Minutes  Intravenous Every 12 hours 12/05/14 0105 12/17/14 2058   12/05/14 0115  ampicillin (OMNIPEN) 2 g in sodium chloride 0.9 % 50 mL IVPB  Status:  Discontinued     2 g 150 mL/hr over 20 Minutes Intravenous 6 times per day 12/05/14 0105 12/07/14 1253   12/05/14 0115  acyclovir (ZOVIRAX) 550 mg in dextrose 5 % 100 mL IVPB  Status:  Discontinued     550 mg 111 mL/hr over 60 Minutes Intravenous 3 times per day 12/05/14 0107 12/07/14 1253   12/04/14 2300  vancomycin (VANCOCIN) 2,000 mg in sodium chloride 0.9 % 500 mL IVPB  Status:  Discontinued     2,000 mg 250 mL/hr over 120 Minutes Intravenous  Once 12/04/14 2205 12/05/14 0051   12/04/14 2015  cefTRIAXone (ROCEPHIN) 2 g in dextrose 5 % 50 mL IVPB     2 g 100 mL/hr over 30 Minutes Intravenous  Once 12/04/14 2009 12/04/14 2100      Assessment 58 y/oF with PMH of DM, HTN, HLD who presented 3/21 with hypoglycemia and AMS after being found unresponsive at home.  Initial treatment for UTI later expanded to empiric meningitis coverage (CTX, vanc, Amp, Acyclovir).  Completed 14 days of Rocephin/vancomycin as of 12/18/14.  Had been stable until fever spike on 4/8; cultures re-obtained.  Currently with 1/2 GPCs in clusters; MD would like to restart vancomycin for possible bacteremia.   Anti-infectives 3/21 >> Ceftriaxone >> 3/21 >> vanc >> 3/22 >> ampicillin >> 3/26 3/22 >> acyclovir for r/o HSV >> 3/26 4/10 >> restart vancomycin >>  Micro: 3/21 blood x 2:  NGF 3/21 urine: >100k col/ml, none predominant FINAL 3/23 CSF: high gluc, nml protein, high WBC; non-cloudy 3/23 CSF: NGF 3/22 HIV/RPR both negative 3/22 VDRL CSF: IP 3/24 cdiff (-) 3/23 LP: glucose 96 (slightly high), protein 42, wbc high 128, neutrophils high 82 3/23 HSV PCR: negative 3/29 RMSF: negative 3/29 Erlichia panel: negative 4/5 BAL: CoNS 4/8 blood: 1/2 GPCs in clusters  Tmax: 101.9 on 4/8; temps in 99s since WBCs: wnl, stable Renal: SCr wnl, previous AKI now resolved   Drug  level / dose changes info: Complicated course of vancomycin d/t AKI (see last pharmacy note from 4/2)   Goal of Therapy:   Vancomycin trough level 15-20 mcg/ml   Eradication of infection  Appropriate antibiotic dosing for indication and renal function  Plan:  Day 1 of antibiotics  Vancomycin 1000 mg IV x 1, then 500 mg IV q12 (1250 mg q12 per nomogram, but patient was only tolerating 1g q24 during previous course as AKI resolved)  Check vancomycin trough soon, given previous dosing/renal issues.  Follow clinical course, renal function, culture results as available - 1/2 CoNS suggests contamination  Follow for de-escalation of antibiotics and LOT   Bernadene Person, PharmD Pager: [phone] 12/24/2014, 3:47 PM

## 2014-12-24 NOTE — Progress Notes (Signed)
CRITICAL VALUE ALERT  Critical value received:  Positive blood culture- gram + cocci in clusters in aerobic bottle  Date of notification:  12/24/2014  Time of notification:  1201  Critical value read back:Yes.    Nurse who received alert:  Starla Link, RN  MD notified (1st page):  Dr. Kalman Shan   Time of first page:  1201  MD notified (2nd page):  Time of second page:  Responding MD:  Dr. Kalman Shan   Time MD responded:  1208

## 2014-12-25 LAB — GLUCOSE, CAPILLARY
GLUCOSE-CAPILLARY: 124 mg/dL — AB (ref 70–99)
GLUCOSE-CAPILLARY: 140 mg/dL — AB (ref 70–99)
GLUCOSE-CAPILLARY: 188 mg/dL — AB (ref 70–99)
Glucose-Capillary: 254 mg/dL — ABNORMAL HIGH (ref 70–99)
Glucose-Capillary: 172 mg/dL — ABNORMAL HIGH (ref 70–99)
Glucose-Capillary: 97 mg/dL (ref 70–99)
Glucose-Capillary: 238 mg/dL — ABNORMAL HIGH (ref 70–99)

## 2014-12-25 MED ORDER — INSULIN GLARGINE 100 UNIT/ML ~~LOC~~ SOLN
12.0000 [IU] | Freq: Two times a day (BID) | SUBCUTANEOUS | Status: DC
  Administered 2014-12-25 – 2014-12-27 (×5): 12 [IU] via SUBCUTANEOUS
  Filled 2014-12-25 (×5): qty 0.12

## 2014-12-25 MED ORDER — FENTANYL CITRATE 0.05 MG/ML IJ SOLN
INTRAMUSCULAR | Status: AC
  Filled 2014-12-25: qty 2

## 2014-12-25 MED ORDER — INSULIN ASPART 100 UNIT/ML ~~LOC~~ SOLN
5.0000 [IU] | Freq: Three times a day (TID) | SUBCUTANEOUS | Status: DC
  Administered 2014-12-25 – 2014-12-27 (×6): 5 [IU] via SUBCUTANEOUS

## 2014-12-25 MED ORDER — RISPERIDONE 1 MG/ML PO SOLN
0.2500 mg | Freq: Two times a day (BID) | ORAL | Status: DC
  Administered 2014-12-25 – 2014-12-27 (×5): 0.25 mg
  Filled 2014-12-25 (×6): qty 0.25

## 2014-12-25 MED ORDER — FERROUS SULFATE 325 (65 FE) MG PO TABS
325.0000 mg | ORAL_TABLET | Freq: Three times a day (TID) | ORAL | Status: DC
  Administered 2014-12-25 – 2014-12-26 (×3): 325 mg via ORAL
  Filled 2014-12-25 (×9): qty 1

## 2014-12-25 MED ORDER — RISPERIDONE 1 MG/ML PO SOLN: 0.5000 mg | Freq: Every day | ORAL | Status: DC

## 2014-12-25 NOTE — Care Management Note (Signed)
CARE MANAGEMENT NOTE 12/25/2014  Patient:  KELLY-ANNE, FILBECK   Account Number:  [id number]  Date Initiated:  12/05/2014  Documentation initiated by:  Leachman,RHONDA  Subjective/Objective Assessment:   59 year old F brought to the [facility] ED on 12/04/2014 after being found down at home by daughter.  Last seen normal 1 day prior. Required intubation in the emergency department for airway protection     Action/Plan:   tbd   Anticipated DC Date:  12/28/2014   Anticipated DC Plan:  SKILLED NURSING FACILITY  In-house referral  Clinical Social Worker      DC Planning Services  CM consult      [facility] Choice  NA   Choice offered to / List presented to:  NA   DME arranged  NA           Status of service:  In process, will continue to follow Medicare Important Message given?   (If response is "NO", the following Medicare IM given date fields will be blank) Date Medicare IM given:   Medicare IM given by:   Date Additional Medicare IM given:   Additional Medicare IM given by:    Discharge Disposition:    Per UR Regulation:  Reviewed for med. necessity/level of care/duration of stay  If discussed at Long Length of Stay Meetings, dates discussed:   12/12/2014  12/14/2014  12/19/2014  12/21/2014    Comments:  December 25, 2014/Rhonda L. Earlene Plater, RN, BSN, CCM. Case Management [facility] [phone] No discharge needs present of time of review. trache collar at 28%/pt dows now open eyes.  Plan is for snf placement.   [id number] Analucia, Wilburn RN,BSN,CCM: [phone] Chart reviewed for patient needs and discharge planning. continues to require full vent support/ was trach'd on [id number]  [id number]/Rhonda Earlene Plater RN, BSN, CCN: [phone] Case management. Chart reviewed for discharge planning and present needs. Discharge needs: none present at time of review. Continues to requre full vent support Fi02 at 30% and sedated.  December 14, 2014/Rhonda L. Earlene Plater, RN, BSN, CCM. Case  Management [facility] [phone] No discharge needs present of time of review. Remains on vent, failed weaning.  December 11, 2014/Rhonda L. Earlene Plater, RN, BSN, CCM. Case Management [facility] [phone] No discharge needs present of time of review. Intaubated, off iv sedation but does not respond to stimuli.  Nerology in.  Poss bacterial menegitis. wbc elevated/ bld cultures are negative for growth/chem 12 panel abnormal, na 148, k+2.9  December 08, 2014/Rhonda L. Earlene Plater, RN, BSN, CCM. Case Management [facility] [phone] No discharge needs present of time of review. remains intubated, responsvie not sedated.  eeg pending  December 05, 2014/Rhonda L. Earlene Plater, RN, BSN, CCM. Case Management [facility] [phone] No discharge needs present of time of review.

## 2014-12-25 NOTE — Progress Notes (Addendum)
CSW continuing to follow for SNF placement.  CSW followed up with pt daughter, Venita Sheffield via telephone to discuss SNF bed offers. Pt daughter discussed that she is interested in [facility] which responded considering. Pt daughter discussed that next option would be [facility] which did provide a bed offer.   Pt daughter confirmed that pt was receiving disability prior to admission and CSW provided education surrounding how Medicaid cover's SNF and facility will also use pt disability to assist with payment. Pt daughter expressed understanding and in agreement.   CSW contacted [facility] to notify facility that pt daughter interested in facility and facility plans to review information to notify this CSW if facility can make a bed offer.  CSW to follow up with pt daughter once CSW receives response from [facility] in order to update pt daughter and for pt daughter to make decision re: SNF.  CSW to continue to follow to provide support and assist with pt disposition needs.   Addendum 3:45 pm:  CSW received notification from [facility] that facility was able to offer a bed.   CSW contacted pt daughter, Venita Sheffield to update pt daughter regarding bed offers.   Pt daughter expressed that she wants to accept bed offer from [facility]. Pt daughter states that this facility is the closest to where she lives and will be convenient for her to visit. CSW discussed with pt daughter that per RN, pt potential for discharge tomorrow. Pt daughter expressed questions surrounding how Medicaid pays for facility and if she needs to apply for additional disability and CSW explained that facility social worker can guide pt daughter on how insurance covers placement and what steps pt daughter needs to take. Pt daughter expressed understanding.   CSW contacted [facility] and left message with  admissions coordinator that pt daughter wants to accept bed offer.   CSW to continue to follow to provide support to pt daughter and assist with pt disposition needs.   Loletta Specter, MSW, LCSW Clinical Social Work [phone]

## 2014-12-25 NOTE — Progress Notes (Addendum)
PULMONARY / CRITICAL CARE MEDICINE   Name: Gina Cox MRN: [id number] DOB: April 01, 1956    ADMISSION DATE:  12/04/2014  REFERRING MD :  Madilyn Hook EDP  CHIEF COMPLAINT:  Found down  INITIAL PRESENTATION: 59 y/o female found unresponsive by family at home.  CBG 54 in ER, and intubated for airway protection >> Difficult airway.  STUDIES:  3/21 CT head >> no acute intracranial process 3/22 MRI Brain >> hippocampal diffusion signal abnormal to the posterior singulate gyrus  3/22 EEG >> no seizure activity 3/22 LP >> glucose 96, protein 42, WBC 128 (82%N), RBC 13 3/25 EEG >> toxic/metabolic encephalopathy.  No seizure activity 3/28 MRI Brain >> stable, abnormal brain MR with hyperintensity in limbic system affecting the hippocampus  SIGNIFICANT EVENTS: 3/21  Admit 3/22  Neurology consulted 3/24  ID consulted 3/26  acyclovir, ampicillin, decadron d/c'ed 4/01  Pt with new but minimal movement of fingers / toes on command 4/04  Tolerated PSV 5/5 4/8::  More alert but no follow commands, tolerating ATC since 11 am 4/7.  Temp 100 axillary, RN is going to recheck.  S/P PEG 4/7.  Note sputum cultures from 4/5.   12/23/14: No change. Eyes open. On ATC    SUBJECTIVE/OVERNIGHT/INTERVAL HX Low gr fever  Eyes open but does not track or follow commands   hyperglycemia 200s   VITAL SIGNS: Temp:  [99.2 F (37.3 C)-100.1 F (37.8 C)] 99.7 F (37.6 C) (04/11 0800) Pulse Rate:  [79-96] 88 (04/11 0731) Resp:  [14-36] 21 (04/11 0800) BP: (106-173)/(46-89) 145/75 mmHg (04/11 0800) SpO2:  [94 %-100 %] 100 % (04/11 0800) FiO2 (%):  [28 %] 28 % (04/11 0800) Weight:  [95.3 kg (210 lb 1.6 oz)] 95.3 kg (210 lb 1.6 oz) (04/11 0435)   VENTILATOR SETTINGS: Vent Mode:  [-]  FiO2 (%):  [28 %] 28 %   INTAKE / OUTPUT:  Intake/Output Summary (Last 24 hours) at 12/25/14 0924 Last data filed at 12/25/14 0800  Gross per 24 hour  Intake   1870 ml  Output   2655 ml  Net   -785 ml   PHYSICAL  EXAMINATION: General: no distress Neuro: Opens eyes spontaneously & to name,Not following commands,   Moving around spontaneously, not tracking HEENT:  mm pink moist, trach midline c/d/i Cardiovascular: s1s2 regular Lungs: even/non-labored, coarse bilaterally, diminished Abdomen: soft, non tender Musculoskeletal:  Scant BLE edema  Skin: no rashes  LABS:  CBC  Recent Labs Lab 12/21/14 0430 12/22/14 0410 12/23/14 0622  WBC 8.2 9.1 9.3  HGB 7.2* 7.8* 8.0*  HCT 22.8* 25.1* 25.4*  PLT 177 198 226    BMET  Recent Labs Lab 12/21/14 0430 12/22/14 0410 12/23/14 0622  NA 142 138 137  K 3.9 4.0 3.4*  CL 103 100 99  CO2 31 29 29   BUN 39* 30* 34*  CREATININE 1.20* 1.04 1.06  GLUCOSE 234* 284* 172*   Electrolytes  Recent Labs Lab 12/20/14 0545 12/21/14 0430 12/22/14 0410 12/23/14 0622  CALCIUM 8.8 8.8 8.5 8.7  MG 1.6  --   --   --   PHOS 4.1  --   --   --       ABG No results for input(s): PHART, PCO2ART, PO2ART in the last 168 hours.   Liver Enzymes No results for input(s): AST, ALT, ALKPHOS, BILITOT, ALBUMIN in the last 168 hours.   Glucose  Recent Labs Lab 12/24/14 1321 12/24/14 1652 12/24/14 2008 12/25/14 0029 12/25/14 0425 12/25/14 3762  GLUCAP  226* 129* 182* 188* 254* 140*   Imaging No results found. ASSESSMENT / PLAN:  NEUROLOGIC A:   #Baselien Hx of ETOH, cocaine abuse.  #Admit isue Acute encephalopathy - considered HSV encephalitis, meningitis, metabolic (hypoglycemia on admission, elevated ammonia, AKI).  Likely diabetic / hypoglycemic encephalopathy with AKI.     - Slight improvement 4/7. But similar on 12/23/14 and 12/24/14. Looking more like hypoglycemic encephalopathy - Ehrlichia panel negative - RMSF negative - ANA and ANCA negative - CSF HSV negative -  anti-NMDAR, anti-AMPA >> negative   P:   Monitor mental status RASS goal 0 Continue lactulose Thiamine, folic acid Passive ROM  Suspect poor prognosis for meaningful  recovery here.  Trial of risperdal  PULMONARY OETT 3/21 >>>4/5 Trach (DF) 4/5>>> A:   Acute respiratory failure secondary to inability to protect airway >> difficult airway. Tobacco abuse.   - on ATC sinc 12/21/14 P:   Continue ATC Trach care per protocol   CARDIOVASCULAR A: Hx HTN, HLD. P:  Continue norvasc 10 mg  Metoprolol 100 mg bid  Hold outpt lisinopril, simvastatin, nebivolol Proably avoid lisinopril due to ATN at admit but if need to restart can do under monitored setting  RENAL A:  AKI - creatinine improved .  FENa 0.67 %, suggestive of pre-renal failure Gap/non gap metabolic acidosis >> resolved.   P:   Renal dose adjust meds kvo IVFS Replace electrolytes as needed.   F/u BMET free water to 200 ml Q6  GASTROINTESTINAL PEG 4/7 >>  A:  Nutrition. Diarrhea >> C diff negative 3/24. P:   TF via PEG Protonix   HEMATOLOGIC A:  Anemia of critical illness - s/p PRBC 12/19/14 x1 P:  F/u CBC SQ heparin for DVT prevention Anemia panel pending  Transfuse as needed for hgb <7  INFECTIOUS Blood 3/21 >> neg BAL 4/5 >> 75k coag neg staph BCx2 4/8 >>  GP clusters 1/2 >> HSV BAL 4/5 >>   A:   Possible meningitis - suspect neurologic injury related to hypoglycemic event + prolonged period of downtime.  S/p Rocephin, vancomycin for 14 days total, completed 4/4 LIJ CVL 3/23 >>  - on /410/16: No fever  P - dc CVL Follow fever curve / WBC trend  Monitor off abx  ENDOCRINE A:  DM with renal complications.   - having hyperglycemia 12/24/14  P:    Avoid hypoglycemia given encephalopathy SSI Lantus 8 units BID Hold outpt pioglitazone, metformin   GLOBAL:  Chagne to sDU status.  CM looking for SNF   Now on ATC since 12/21/14. Do labs 1-2 times per week only. No family at bedside 4/11   Cyril Mourning MD. Tonny Bollman. [facility] Pager [phone] If no response call [phone]     12/25/2014 9:24 AM

## 2014-12-25 NOTE — Progress Notes (Signed)
Inpatient Diabetes Program Recommendations  AACE/ADA: New Consensus Statement on Inpatient Glycemic Control (2013)  Target Ranges:  Prepandial:   less than 140 mg/dL      Peak postprandial:   less than 180 mg/dL (1-2 hours)      Critically ill patients:  140 - 180 mg/dL    Results for IVONNA, KINNICK (MRN [id number]) as of 12/25/2014 07:19  Ref. Range 12/23/2014 23:19 12/24/2014 05:04 12/24/2014 07:41 12/24/2014 13:09 12/24/2014 13:21 12/24/2014 16:52 12/24/2014 20:08  Glucose-Capillary Latest Range: 70-99 mg/dL 409 (H) 811 (H) 914 (H) 227 (H) 226 (H) 129 (H) 182 (H)     Current Orders: Lantus 8 units bid  Novolog Resistant SSI Q4 hours    **Patient currently receiving Vital HP Tube Feeds at 40 cc/hour.  **Having elevated glucose levels.  **Patient required 32 units total of Novolog SSI yesterday (04/10).    MD- Please consider adding Novolog Tube Feed coverage-  Novolog 3 units Q4 hours (hold if pt NPO, hold if tube feeds held for any reason)    Will Follow. Ambrose Finland RN, MSN, CDE Diabetes Coordinator Inpatient Diabetes Program Team Pager: [phone] (8a-5p)

## 2014-12-26 DIAGNOSIS — G931 Anoxic brain damage, not elsewhere classified: Secondary | ICD-10-CM

## 2014-12-26 LAB — CBC
HCT: 27.2 % — ABNORMAL LOW (ref 36.0–46.0)
HEMOGLOBIN: 8.6 g/dL — AB (ref 12.0–15.0)
MCH: 30.2 pg (ref 26.0–34.0)
MCHC: 31.6 g/dL (ref 30.0–36.0)
MCV: 95.4 fL (ref 78.0–100.0)
Platelets: 248 10*3/uL (ref 150–400)
RBC: 2.85 MIL/uL — ABNORMAL LOW (ref 3.87–5.11)
RDW: 13.8 % (ref 11.5–15.5)
WBC: 9 10*3/uL (ref 4.0–10.5)

## 2014-12-26 LAB — BASIC METABOLIC PANEL
ANION GAP: 11 (ref 5–15)
BUN: 49 mg/dL — ABNORMAL HIGH (ref 6–23)
CHLORIDE: 105 mmol/L (ref 96–112)
CO2: 29 mmol/L (ref 19–32)
Calcium: 9.2 mg/dL (ref 8.4–10.5)
Creatinine, Ser: 1.09 mg/dL (ref 0.50–1.10)
GFR, EST AFRICAN AMERICAN: 62 mL/min — AB (ref >90)
GFR, EST NON AFRICAN AMERICAN: 54 mL/min — AB (ref >90)
Glucose, Bld: 172 mg/dL — ABNORMAL HIGH (ref 70–99)
Potassium: 3.6 mmol/L (ref 3.5–5.1)
Sodium: 145 mmol/L (ref 135–145)

## 2014-12-26 LAB — GLUCOSE, CAPILLARY
GLUCOSE-CAPILLARY: 192 mg/dL — AB (ref 70–99)
GLUCOSE-CAPILLARY: 58 mg/dL — AB (ref 70–99)
Glucose-Capillary: 168 mg/dL — ABNORMAL HIGH (ref 70–99)
Glucose-Capillary: 182 mg/dL — ABNORMAL HIGH (ref 70–99)
Glucose-Capillary: 122 mg/dL — ABNORMAL HIGH (ref 70–99)
Glucose-Capillary: 137 mg/dL — ABNORMAL HIGH (ref 70–99)
Glucose-Capillary: 101 mg/dL — ABNORMAL HIGH (ref 70–99)

## 2014-12-26 LAB — CULTURE, BLOOD (ROUTINE X 2)

## 2014-12-26 MED ORDER — DEXTROSE 50 % IV SOLN
INTRAVENOUS | Status: AC
  Administered 2014-12-26: 25 mL
  Filled 2014-12-26: qty 50

## 2014-12-26 MED ORDER — VITAL AF 1.2 CAL PO LIQD
1000.0000 mL | ORAL | Status: DC
  Administered 2014-12-26 – 2014-12-27 (×2): 1000 mL
  Filled 2014-12-26 (×3): qty 1000

## 2014-12-26 NOTE — Progress Notes (Signed)
PULMONARY / CRITICAL CARE MEDICINE   Name: Gina Cox MRN: [id number] DOB: March 17, 1956    ADMISSION DATE:  12/04/2014  REFERRING MD :  Madilyn Hook EDP  CHIEF COMPLAINT:  Found down  INITIAL PRESENTATION: 59 y/o female found unresponsive by family at home.  CBG 54 in ER, and intubated for airway protection >> Difficult airway.  STUDIES:  3/21 CT head >> no acute intracranial process 3/22 MRI Brain >> hippocampal diffusion signal abnormal to the posterior singulate gyrus  3/22 EEG >> no seizure activity 3/22 LP >> glucose 96, protein 42, WBC 128 (82%N), RBC 13 3/25 EEG >> toxic/metabolic encephalopathy.  No seizure activity 3/28 MRI Brain >> stable, abnormal brain MR with hyperintensity in limbic system affecting the hippocampus  SIGNIFICANT EVENTS: 3/21  Admit 3/22  Neurology consulted 3/24  ID consulted 3/26  acyclovir, ampicillin, decadron d/c'ed 4/01  Pt with new but minimal movement of fingers / toes on command 4/04  Tolerated PSV 5/5 4/8::  More alert but no follow commands, tolerating ATC since 11 am 4/7.  Temp 100 axillary, RN is going to recheck.  S/P PEG 4/7.  Note sputum cultures from 4/5.   12/23/14: No change. Eyes open. On ATC 4/10: vanc started.    SUBJECTIVE/OVERNIGHT/INTERVAL HX  no distress. No sig change  VITAL SIGNS: Temp:  [98.5 F (36.9 C)-100.8 F (38.2 C)] 98.5 F (36.9 C) (04/12 0800) Pulse Rate:  [85-96] 85 (04/12 0400) Resp:  [9-32] 14 (04/12 0900) BP: (113-196)/(43-79) 155/52 mmHg (04/12 0900) SpO2:  [90 %-100 %] 94 % (04/12 0900) FiO2 (%):  [21 %] 21 % (04/12 0400) Weight:  [94.8 kg (208 lb 15.9 oz)] 94.8 kg (208 lb 15.9 oz) (04/12 0500)   VENTILATOR SETTINGS: Vent Mode:  [-]  FiO2 (%):  [21 %] 21 %   INTAKE / OUTPUT:  Intake/Output Summary (Last 24 hours) at 12/26/14 0908 Last data filed at 12/26/14 0900  Gross per 24 hour  Intake   1670 ml  Output   1690 ml  Net    -20 ml   PHYSICAL EXAMINATION: General: no distress Neuro: Opens  eyes spontaneously & to name,Not following commands,   Moving around spontaneously, not tracking, no change  HEENT:  mm pink moist, trach midline c/d/i # 6 cuffed Cardiovascular: s1s2 regular Lungs: even/non-labored, coarse bilaterally, diminished Abdomen: soft, non tender Musculoskeletal:  Scant BLE edema  Skin: no rashes  LABS:  CBC  Recent Labs Lab 12/22/14 0410 12/23/14 0622 12/26/14 0338  WBC 9.1 9.3 9.0  HGB 7.8* 8.0* 8.6*  HCT 25.1* 25.4* 27.2*  PLT 198 226 248    BMET  Recent Labs Lab 12/22/14 0410 12/23/14 0622 12/26/14 0338  NA 138 137 145  K 4.0 3.4* 3.6  CL 100 99 105  CO2 29 29 29   BUN 30* 34* 49*  CREATININE 1.04 1.06 1.09  GLUCOSE 284* 172* 172*    Glucose  Recent Labs Lab 12/25/14 1149 12/25/14 1522 12/25/14 1931 12/25/14 2302 12/26/14 0320 12/26/14 0758  GLUCAP 172* 124* 97 238* 168* 182*   Imaging No results found. ASSESSMENT / PLAN:   Acute encephalopathy felt likely d/t to hypoglycemic event HSV encephalitis, meningitis, and infection all ruled out  Hx of ETOH, cocaine abuse. - Ehrlichia panel negative - RMSF negative - ANA and ANCA negative - CSF HSV negative -  anti-NMDAR, anti-AMPA >> negative  Plan:   Monitor mental status RASS goal 0 Continue lactulose Thiamine, folic acid Passive ROM  Suspect poor prognosis  for meaningful recovery here.  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 Difficult airway at baseline H/o Tobacco abuse.  - on ATC sinc 12/21/14 Plan:   Continue ATC Trach care per protocol  We will change to 6 cuffless. Will need to change w/ tube changer as w/in 14d course   Fever w/ GPC in clusters. ? Line related bacteremia? LIJ CVL 3/23 >>4/12 Blood 3/21 >> neg BAL 4/5 >> 75k coag neg staph BCx2 4/8 >>  GP clusters 1/2 >> HSV 4/5: neg  Plan Vanc started 4/10>>> Follow fever curve / WBC trend  Await blood sensitivities  Try to avoid central access if able. Will  speak to CM re: PIV vs PICC at SNF.   Hx HTN, HLD. Plan:  Continue norvasc 10 mg  Metoprolol 100 mg bid  Hold outpt lisinopril, simvastatin, nebivolol Proably avoid lisinopril due to ATN at admit but if need to restart can do under monitored setting  Dysphagia due to MS. S/p PEG 4/7 Plan:   TF via PEG Protonix  Anemia of critical illness - s/p PRBC 12/19/14 x1 Plan:  F/u CBC SQ heparin for DVT prevention Anemia panel pending  Transfuse as needed for hgb <7  DM with renal complications. - having hyperglycemia 12/24/14  Plan:   Avoid hypoglycemia given encephalopathy SSI Lantus 8 units BID Hold outpt pioglitazone, metformin   GLOBAL:  Fever curve improved. No sig change in MS. Awaiting Culture data. Will cont abx, await sensitivities, move to medical ward.   Simonne Martinet ACNP-BC [facility] Pager # [phone] OR # [phone] if no answer   12/26/2014 9:08 AM

## 2014-12-26 NOTE — Progress Notes (Signed)
NUTRITION FOLLOW UP  Intervention:   D/C Vital HP D/C Prostat  Start Vital AF 1.2 @ 40 mL/hr and increase by 10 mL every 4 hours to goal rate of 70 mL/hr  Tube feeding regimen provides 2016 kcal (100% of needs), 126 grams of protein, and 1361 ml of H2O.   Continue free water flushes of 200 mL QID Total free water: 2161 mL  Nutrition Dx:   Inadequate oral intake related to inability to eat as evidenced by NPO status; ongoing.  Goal:   Pt to meet >/= 90% of their estimated nutrition needs; not met  Monitor:   TF initiation/tolerance/adequacy, weight trends, labs, I/Os  Assessment:   Pt found unresponsive, brought to ED and required intubation. PMH significant for HTN, DM, polysubstance abuse.   Pt now off ventilator support and on trach collar only. Transferred to floor.  PEG placed 4/7, TF restarted 4/8 Will change TF formula and rate to better fit pt's needs.  Pt not medically ready for discharge today. Anticipate discharge tomorrow.   Height: Ht Readings from Last 1 Encounters:  12/11/14 '5\' 4"'  (1.626 m)    Weight Status:   Wt Readings from Last 1 Encounters:  12/26/14 208 lb 15.9 oz (94.8 kg)    Re-estimated needs:  Kcal: 2000-2200 Protein: 110-125 g Fluid: 2.0-2.2 L/day  Skin: intact  Diet Order: Diet NPO time specified   Intake/Output Summary (Last 24 hours) at 12/26/14 1334 Last data filed at 12/26/14 1000  Gross per 24 hour  Intake   1340 ml  Output   1620 ml  Net   -280 ml    Last BM: 4/12- 250 mL via rectal tube   Labs:   Recent Labs Lab 12/20/14 0545  12/22/14 0410 12/23/14 0622 12/26/14 0338  NA 143  < > 138 137 145  K 4.1  < > 4.0 3.4* 3.6  CL 104  < > 100 99 105  CO2 31  < > '29 29 29  ' BUN 39*  < > 30* 34* 49*  CREATININE 1.19*  < > 1.04 1.06 1.09  CALCIUM 8.8  < > 8.5 8.7 9.2  MG 1.6  --   --   --   --   PHOS 4.1  --   --   --   --   GLUCOSE 162*  < > 284* 172* 172*  < > = values in this interval not displayed.  CBG (last  3)   Recent Labs  12/25/14 2302 12/26/14 0320 12/26/14 0758  GLUCAP 238* 168* 182*    Scheduled Meds: . amLODipine  10 mg Per Tube Daily  . antiseptic oral rinse  7 mL Mouth Rinse QID  . chlorhexidine  15 mL Mouth Rinse BID  . etomidate  40 mg Intravenous Once  . feeding supplement (PRO-STAT SUGAR FREE 64)  30 mL Per Tube BID  . feeding supplement (VITAL HIGH PROTEIN)  1,000 mL Per Tube Q24H  . ferrous sulfate  325 mg Oral TID WC  . folic acid  1 mg Per Tube Daily  . free water  200 mL Per Tube 4 times per day  . insulin aspart  0-20 Units Subcutaneous 6 times per day  . insulin aspart  5 Units Subcutaneous TID WC  . insulin glargine  12 Units Subcutaneous BID  . lactulose  20 g Per Tube Daily  . metoprolol tartrate  100 mg Per Tube BID  . pantoprazole sodium  40 mg Per Tube Q24H  .  risperiDONE  0.25 mg Per Tube BID  . thiamine  100 mg Per Tube Daily  . vancomycin  500 mg Intravenous Q12H    Continuous Infusions: . sodium chloride 10 mL/hr at 12/24/14 [address] [place], [place], [place]

## 2014-12-26 NOTE — Progress Notes (Signed)
Subjective: Patient somewhat improved.  Trached.  Still does not interact.    Objective: Current vital signs: BP 155/52 mmHg  Pulse 85  Temp(Src) 98.5 F (36.9 C) (Oral)  Resp 14  Ht  (1.626 m)  Wt 94.8 kg (208 lb 15.9 oz)  BMI 35.86 kg/m2  SpO2 95% Vital signs in last 24 hours: Temp:  [98.5 F (36.9 C)-100.8 F (38.2 C)] 98.5 F (36.9 C) (04/12 0800) Pulse Rate:  [85-93] 85 (04/12 0400) Resp:  [9-32] 14 (04/12 0900) BP: (113-196)/(43-79) 155/52 mmHg (04/12 0900) SpO2:  [90 %-100 %] 95 % (04/12 1310) FiO2 (%):  [21 %-28 %] 28 % (04/12 1310) Weight:  [94.8 kg (208 lb 15.9 oz)] 94.8 kg (208 lb 15.9 oz) (04/12 0500)  Intake/Output from previous day: 04/11 0701 - 04/12 0700 In: 1650 [I.V.:30; NG/GT:1520; IV Piggyback:100] Out: 1770 [Urine:1520; Stool:250] Intake/Output this shift: Total I/O In: 160 [NG/GT:160] Out: 375 [Urine:375] Nutritional status:    Neurologic Exam: Mental Status: Patient does not respond to verbal stimuli.  Eyes open.  Seems to alert somewhat to name being called.  Does not follow commands.  No attempts at speech.   Cranial Nerves: II: patient does not respond confrontation bilaterally, pupils right 3 mm, left 3 mm,and minimally reactive bilaterally III,IV,VI: doll's response present bilaterally.  V,VII: corneal reflex present bilaterally  VIII: patient does not respond to verbal stimuli IX,X: gag reflex reduced, XI: trapezius strength unable to test bilaterally XII: tongue strength unable to test Motor: Patient moves all extremities spontaneously but not purposely.  Moves right greater than left.   Deep Tendon Reflexes:  3+ throughout. Plantars: upgoing bilaterally Cerebellar: Unable to perform    Lab Results: Basic Metabolic Panel:  Recent Labs Lab 12/20/14 0545 12/21/14 0430 12/22/14 0410 12/23/14 0622 12/26/14 0338  NA 143 142 138 137 145  K 4.1 3.9 4.0 3.4* 3.6  CL 104 103 100 99 105  CO2 GLUCOSE 162*  234* 284* 172* 172*  BUN 39* 39* 30* 34* 49*  CREATININE 1.19* 1.20* 1.04 1.06 1.09  CALCIUM 8.8 8.8 8.5 8.7 9.2  MG 1.6  --   --   --   --   PHOS 4.1  --   --   --   --     Liver Function Tests: No results for input(s): AST, ALT, ALKPHOS, BILITOT, PROT, ALBUMIN in the last 168 hours. No results for input(s): LIPASE, AMYLASE in the last 168 hours. No results for input(s): AMMONIA in the last 168 hours.  CBC:  Recent Labs Lab 12/20/14 0545 12/21/14 0430 12/22/14 0410 12/23/14 0622 12/26/14 0338  WBC 9.3 8.2 9.1 9.3 9.0  HGB 7.2* 7.2* 7.8* 8.0* 8.6*  HCT 22.7* 22.8* 25.1* 25.4* 27.2*  MCV 97.0 96.6 96.5 95.8 95.4  PLT 168 177 198 226 248    Cardiac Enzymes: No results for input(s): CKTOTAL, CKMB, CKMBINDEX, TROPONINI in the last 168 hours.  Lipid Panel: No results for input(s): CHOL, TRIG, HDL, CHOLHDL, VLDL, LDLCALC in the last 168 hours.  CBG:  Recent Labs Lab 12/25/14 1931 12/25/14 2302 12/26/14 0320 12/26/14 0758 12/26/14 1312  GLUCAP 97 238* 168* 182* 192*    Microbiology: Results for orders placed or performed during the hospital encounter of 12/04/14  Blood Culture (routine x 2)     Status: None   Collection Time: 12/04/14  8:10 PM  Result Value Ref Range Status   Specimen Description BLOOD RIGHT WRIST  Final  Special Requests BOTTLES DRAWN AEROBIC AND ANAEROBIC 5CC EACH  Final   Culture   Final    NO GROWTH 5 DAYS Performed at [facility]    Report Status 12/11/2014 FINAL  Final  Blood Culture (routine x 2)     Status: None   Collection Time: 12/04/14  8:11 PM  Result Value Ref Range Status   Specimen Description BLOOD LEFT ARM  Final   Special Requests BOTTLES DRAWN AEROBIC AND ANAEROBIC 5CC EACH  Final   Culture   Final    NO GROWTH 5 DAYS Performed at [facility]    Report Status 12/11/2014 FINAL  Final  Urine culture     Status: None   Collection Time: 12/04/14  9:31 PM  Result Value Ref Range Status   Specimen  Description URINE, CLEAN CATCH  Final   Special Requests NONE  Final   Colony Count   Final    >=100,000 COLONIES/ML Performed at [facility]    Culture   Final    Multiple bacterial morphotypes present, none predominant. Suggest appropriate recollection if clinically indicated. Performed at [facility]    Report Status 12/05/2014 FINAL  Final  MRSA PCR Screening     Status: None   Collection Time: 12/05/14  4:23 AM  Result Value Ref Range Status   MRSA by PCR NEGATIVE NEGATIVE Final    Comment:        The GeneXpert MRSA Assay (FDA approved for NASAL specimens only), is one component of a comprehensive MRSA colonization surveillance program. It is not intended to diagnose MRSA infection nor to guide or monitor treatment for MRSA infections.   CSF culture     Status: None   Collection Time: 12/06/14 11:25 AM  Result Value Ref Range Status   Specimen Description CSF  Final   Special Requests Normal  Final   Gram Stain   Final    NO WBC SEEN NO ORGANISMS SEEN CYTOSPIN SLIDE Performed at [facility]    Culture   Final    NO GROWTH 3 DAYS Performed at [facility]    Report Status 12/15/2014 FINAL  Final  Clostridium Difficile by PCR     Status: None   Collection Time: 12/07/14  4:28 PM  Result Value Ref Range Status   C difficile by pcr NEGATIVE NEGATIVE Final  Culture, bal-quantitative     Status: None   Collection Time: 12/19/14  3:26 PM  Result Value Ref Range Status   Specimen Description BRONCHIAL ALVEOLAR LAVAGE  Final   Special Requests Normal  Final   Gram Stain   Final    RARE WBC PRESENT, PREDOMINANTLY PMN RARE SQUAMOUS EPITHELIAL CELLS PRESENT RARE GRAM POSITIVE COCCI IN PAIRS Performed at [facility]   Final    75,000 COLONIES/ML Performed at [facility]    Culture   Final    MODERATE STAPHYLOCOCCUS SPECIES (COAGULASE NEGATIVE) Performed at [facility]    Report  Status 12/21/2014 FINAL  Final  Culture, blood (routine x 2)     Status: None (Preliminary result)   Collection Time: 12/22/14  5:20 AM  Result Value Ref Range Status   Specimen Description BLOOD LEFT HAND  Final   Special Requests BOTTLES DRAWN AEROBIC AND ANAEROBIC  Final   Culture   Final           BLOOD CULTURE RECEIVED NO GROWTH TO DATE CULTURE WILL  BE HELD FOR 5 DAYS BEFORE ISSUING A FINAL NEGATIVE REPORT Performed at [facility]    Report Status PENDING  Incomplete  Culture, blood (routine x 2)     Status: None   Collection Time: 12/22/14  5:30 AM  Result Value Ref Range Status   Specimen Description BLOOD RIGHT HAND  Final   Special Requests BOTTLES DRAWN AEROBIC AND ANAEROBIC 5CC  Final   Culture   Final    STAPHYLOCOCCUS SPECIES (COAGULASE NEGATIVE) Note: THE SIGNIFICANCE OF ISOLATING THIS ORGANISM FROM A SINGLE SET OF BLOOD CULTURES WHEN MULTIPLE SETS ARE DRAWN IS UNCERTAIN. PLEASE NOTIFY THE MICROBIOLOGY DEPARTMENT WITHIN ONE WEEK IF SPECIATION AND SENSITIVITIES ARE REQUIRED. Note: Gram Stain Report Called to,Read Back By and Verified With: Caryn Section RN 12/24/14 AT 1200 BY [facility] Performed at [facility]    Report Status 12/26/2014 FINAL  Final    Coagulation Studies: No results for input(s): LABPROT, INR in the last 72 hours.  Imaging: No results found.  Medications:  I have reviewed the patient's current medications. Scheduled: . amLODipine  10 mg Per Tube Daily  . antiseptic oral rinse  7 mL Mouth Rinse QID  . chlorhexidine  15 mL Mouth Rinse BID  . etomidate  40 mg Intravenous Once  . ferrous sulfate  325 mg Oral TID WC  . folic acid  1 mg Per Tube Daily  . free water  200 mL Per Tube 4 times per day  . insulin aspart  0-20 Units Subcutaneous 6 times per day  . insulin aspart  5 Units Subcutaneous TID WC  . insulin glargine  12 Units Subcutaneous BID  . lactulose  20 g Per Tube Daily  . metoprolol tartrate  100 mg Per Tube BID  .  pantoprazole sodium  40 mg Per Tube Q24H  . risperiDONE  0.25 mg Per Tube BID  . thiamine  100 mg Per Tube Daily  . vancomycin  500 mg Intravenous Q12H    Assessment/Plan: Patient improved.  No longer posturing but with spontaneous movement throughout.  Does not track or make attempts at speech.  Does not follow commands.  MRI repeated and shows no change with continued hippocampal T2 hyperintensity.  Work up has been unrevealing.  Discharge anticipated for tomorrow.     LOS: 22 days   Thana Farr, MD [facility] [phone] 12/26/2014  3:08 PM

## 2014-12-26 NOTE — Discharge Summary (Signed)
Physician Discharge Summary       Patient ID: Gina Cox MRN: [id number] DOB/AGE: 61-Oct-1957 61 y.o.  Admit date: 12/04/2014 Discharge date: 12/27/2014  Discharge Diagnoses:   Acute Hypoglycemic Encephalopathy  Persistent Vegetative state  History of ETOH and Cocaine abuse Acute respiratory failure (not specified & resolved) Difficult airway Tracheostomy status HTN Hyperlipidemia AKI (resolved) Mixed anion gap and non-anion gap metabolic acidosis (resolved) Dysphagia  S/P PEG Coag negative staph bacteremia  Anemia of Critical illness  DM w/ complications    Detailed Hospital Course:   This is a 59 year old female who has a past medical history significant for hypertension, diabetes, and polysubstance abuse who was brought to the [facility] ED 3/21 after being found down unresponsive at home by her daughter. She was last seen normal 1 day prior around 1PM. Daughter stated that this has happened before whenever pt does not eat and then goes to sleep. Apparently this had been occuring more over the past 4 - 5 days. When daughter went to visit her one day earlier, pt was on the ground but refused that she be brought to ED for evaluation as she was adamant that nothing was wrong with her. Pt asked daughter to take her to the grocery store and per daughter, pt acted her normal self while shopping and on return back home. When she left, pt was in her USOH. On 3/21 when daughter got off work, she returned to pt's house and this is when she found her down and unresponsive. From her description, it seems that pt was agonal at the time with very shallow respirations as well as "gurgling / snoring" per daughter. On EMS arrival, initial blood glucose was 54 (improved to 197 after 25g D50).  She was intubated on arrival to the ER. Noted to be a VERY DIFFICULT INTUBATION PER EDP. Additional diagnostics in ER found her in acute renal failure and hyperkalemic.  She was placed on full vent support,  multiple cultures were sent, and she was initially started empirically on treatment for meningitis, and she was admitted to the intensive care. In spite of aggressive supportive interventions he mental status never improved to baseline. We had both infectious disease and neurology consultation in hopes of finding a reversible etiology for her acute encephalopathic state. She went thru extensive diagnostic evaluation which included:  3/21 CT head >> no acute intracranial process 3/22 MRI Brain >> hippocampal diffusion signal abnormal to the posterior singulate gyrus  3/22 EEG >> no seizure activity 3/22 LP >> glucose 96, protein 42, WBC 128 (82%N), RBC 13 3/25 EEG >> toxic/metabolic encephalopathy. No seizure activity 3/28 MRI Brain >> stable, abnormal brain MR with hyperintensity in limbic system affecting the hippocampus.   Ultimately we felt her on-going persistent vegetative state was the result of an acute hypoglycemia event and that the prognosis for a functional recovery was very poor. She ultimately had trach placed on 4/5 and was liberated from the mechanical ventilator since 4/7. Since that time she has had little change in her mental status. She did spike temp on 4/8. Blood cultures eventually grew out coag negative staph which we felt likely represented line sepsis. Her central venous line was removed and she completed 4 doses of vancomycin for this. At time of discharge she has met maximum benefit from in-patient stay and she is now ready for d/c to the SNF setting for supportive care. Her prognosis for functional recovery is poor and suspect that she will be trach/PEG and total  care dependent indefinitely.    Discharge Plan by active problems   Acute encephalopathy in setting of Hypoglycemia event; now in Persistent Vegetative State (PVS) Hx of ETOH, cocaine abuse. Suspect poor prognosis for meaningful recovery here.  Plan:  Continue lactulose Thiamine, folic acid Passive ROM    Tracheostomy status 4/5 s/p inability to protect airway. Difficult airway at baseline H/o Tobacco abuse. 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  Plan:  Continue ATC Trach care per protocol  F/u trach clinic June 22 at 1pm w/ [place] at trach clinic in [place] negative staph bacteremia. She is s/p Central line removal and she received 4 doses of vancomycin for this.  Plan Trend fever curve   Hx HTN, HLD. Plan:  Continue norvasc 10 mg  Metoprolol 100 mg bid  Hold outpt lisinopril, simvastatin, nebivolol Proably avoid lisinopril due to ATN at admit but if need to restart can do under monitored setting  Dysphagia due to MS. S/p PEG 4/7 Plan:  TF via PEG Protonix  Anemia of critical illness - s/p PRBC 12/19/14 x1 Plan:  F/u CBC SQ heparin for DVT prevention  DM with renal complications. - having hyperglycemia 12/24/14  Plan:  Avoid hypoglycemia given encephalopathy SSI Lantus 12 units BID Hold outpt pioglitazone, metformin   Significant Hospital tests/ studies  Consults: neurology; infectious disease    SIGNIFICANT EVENTS: 3/21 Admit 3/22 Neurology consulted 3/24 ID consulted 3/26 acyclovir, ampicillin, decadron d/c'ed 4/01 Pt with new but minimal movement of fingers / toes on command 4/04 Tolerated PSV 5/5 4/8 More alert but no follow commands, tolerating ATC since 11 am 4/7. Temp 100 axillary, RN is going to recheck. S/P PEG 4/7. Note sputum cultures from 4/5.  12/23/14: No change. Eyes open. On ATC 4/10: vanc started.  4/11 Central line removed 4/12 trach changed to 6 cuffless.   Discharge Exam: BP 135/69 mmHg  Pulse 84  Temp(Src) 98.6 F (37 C) (Axillary)  Resp 18  Ht _0  (1.626 m)  Wt 96.361 kg (212 lb 7 oz)  BMI 36.45 kg/m2  SpO2 98% 28 % ATC   General: no distress Neuro: Opens eyes spontaneously & to name,Not following commands,  Moving around spontaneously, not tracking, no change  HEENT: mm pink moist, trach midline c/d/i # 6 cuffed Cardiovascular: s1s2 regular Lungs: even/non-labored, coarse bilaterally, diminished Abdomen: soft, non tender Musculoskeletal: Scant BLE edema  Skin: no rashes  Labs at discharge Lab Results  Component Value Date   CREATININE 1.09 12/26/2014   BUN 49* 12/26/2014   NA 145 12/26/2014   K 3.6 12/26/2014   CL 105 12/26/2014   CO2 29 12/26/2014   Lab Results  Component Value Date   WBC 9.0 12/26/2014   HGB 8.6* 12/26/2014   HCT 27.2* 12/26/2014   MCV 95.4 12/26/2014   PLT 248 12/26/2014   Lab Results  Component Value Date   ALT 157* 12/13/2014   AST 120* 12/13/2014   ALKPHOS 65 12/13/2014   BILITOT 0.2* 12/13/2014   Lab Results  Component Value Date   INR 1.06 12/19/2014   INR 0.97 12/04/2014    Current radiology studies No results found.  Disposition:  01-Home or Self Care      Discharge Instructions    Diet - low sodium heart healthy    Complete by:  As directed      Increase activity slowly  Complete by:  As directed             Medication List    STOP taking these medications        fluorometholone 0.1 % ophthalmic suspension  Commonly known as:  FML     ketorolac 0.4 % Soln  Commonly known as:  ACULAR     lisinopril 40 MG tablet  Commonly known as:  PRINIVIL,ZESTRIL     nebivolol 5 MG tablet  Commonly known as:  BYSTOLIC     ofloxacin 0.3 % ophthalmic solution  Commonly known as:  OCUFLOX     pioglitazone-metformin 15-500 MG per tablet  Commonly known as:  ACTOPLUS MET     simvastatin 20 MG tablet  Commonly known as:  ZOCOR      TAKE these medications        amLODipine 10 MG tablet  Commonly known as:  NORVASC  Place 1 tablet (10 mg total) into feeding tube daily.     antiseptic oral rinse 0.05 % Liqd solution  Commonly known as:  CPC / CETYLPYRIDINIUM CHLORIDE 0.05%  7 mLs by Mouth Rinse route QID.      chlorhexidine 0.12 % solution  Commonly known as:  PERIDEX  15 mLs by Mouth Rinse route 2 (two) times daily.     feeding supplement (VITAL AF 1.2 CAL) Liqd  Place 1,000 mLs into feeding tube continuous.     ferrous sulfate 300 (60 FE) MG/5ML syrup  Take 5 mLs (300 mg total) by mouth 3 (three) times daily with meals.     folic acid 1 MG tablet  Commonly known as:  FOLVITE  Place 1 tablet (1 mg total) into feeding tube daily.     free water Soln  Place 200 mLs into feeding tube every 6 (six) hours.     insulin aspart 100 UNIT/ML injection  Commonly known as:  novoLOG  Inject 0-20 Units into the skin every 4 (four) hours.     insulin aspart 100 UNIT/ML injection  Commonly known as:  novoLOG  Inject 5 Units into the skin 3 (three) times daily with meals.     insulin glargine 100 UNIT/ML injection  Commonly known as:  LANTUS  Inject 0.12 mLs (12 Units total) into the skin 2 (two) times daily.     metoprolol tartrate 25 mg/10 mL Susp  Commonly known as:  LOPRESSOR  Place 40 mLs (100 mg total) into feeding tube 2 (two) times daily.     pantoprazole sodium 40 mg/20 mL Pack  Commonly known as:  PROTONIX  Place 20 mLs (40 mg total) into feeding tube daily.     risperiDONE 1 MG/ML oral solution  Commonly known as:  RISPERDAL  Place 0.3 mLs (0.3 mg total) into feeding tube 2 (two) times daily.     thiamine 100 MG tablet  Place 1 tablet (100 mg total) into feeding tube daily.           OXYGEN: 28% Trach Collar  Follow-up Information    Follow up with [facility] On 03/07/2015.   Specialty:  Respiratory Therapy   Why:  1 pm    Contact information:   [address] [id number] [place] [phone]      Discharged Condition: good  Physician Statement:   The Patient was personally examined, the discharge assessment and plan has been personally reviewed and I agree with ACNP Ahsan Esterline's assessment and  plan. > 30 minutes of time  have been dedicated to discharge assessment, planning and discharge instructions.   Signed: Bodhi Stenglein,PETE 12/27/2014, 11:10 AM

## 2014-12-26 NOTE — Progress Notes (Addendum)
CSW continuing to follow.   Pt daughter has chosen bed at [facility] for disposition.   CSW spoke with MD this morning who reports that pt not yet medically ready for discharge today as blood cultures are pending to determine if pt needs to be discharged on antibiotics. Hopeful for results from blood cultures today in order to determine plan and anticipate discharge tomorrow.   CSW contacted [facility] to update facility and sent updated clinical information.   CSW contacted pt daughter via telephone and left voice message.   CSW to continue to follow to assist with pt disposition needs.   Addendum 2:14 pm:  CSW received return phone call from pt daughter and updated pt daughter regarding that pt not yet medically ready for discharge today, but hopeful for results from blood cultures in order for pt to be discharged tomorrow.  Pt daughter expressed understanding and appreciative of update.  CSW to continue to follow.  Loletta Specter, MSW, LCSW Clinical Social Work [phone]

## 2014-12-26 NOTE — Significant Event (Signed)
Hypoglycemic Event  CBG: 58  Treatment: D50 IV 25 mL  Symptoms: None  Follow-up CBG: Time: 1907 CBG Result:137  Possible Reasons for Event: Unknown  Comments/MD notified:none    Baker Pierini, Molli Barrows  Remember to initiate Hypoglycemia Order Set & complete

## 2014-12-26 NOTE — Procedures (Signed)
Tracheostomy tube change: Verbal timeout was performed prior to the procedure. The old  # 6 cuffed trach was carefully removed. the tracheostomy site appeared: unremarkable. A new #  6 Cuffless trach was easily placed in the tracheostomy stoma using a tube exchanger given it was < 10-14d new trach, and secured with velcro trach ties. The tracheostomy was patent, good color change observed via EZ-CAP.  Pt tolerated well.   Simonne Martinet ACNP-BC [facility] Pulmonary/Critical Care Pager # [phone] OR # [phone] if no answer

## 2014-12-27 LAB — GLUCOSE, CAPILLARY
GLUCOSE-CAPILLARY: 197 mg/dL — AB (ref 70–99)
GLUCOSE-CAPILLARY: 117 mg/dL — AB (ref 70–99)
GLUCOSE-CAPILLARY: 203 mg/dL — AB (ref 70–99)
Glucose-Capillary: 227 mg/dL — ABNORMAL HIGH (ref 70–99)

## 2014-12-27 MED ORDER — FERROUS SULFATE 300 (60 FE) MG/5ML PO SYRP
300.0000 mg | ORAL_SOLUTION | Freq: Three times a day (TID) | ORAL | Status: DC
  Administered 2014-12-27 (×2): 300 mg via ORAL
  Filled 2014-12-27 (×4): qty 5

## 2014-12-27 MED ORDER — RISPERIDONE 1 MG/ML PO SOLN: 0.2500 mg | Freq: Two times a day (BID) | ORAL | Status: DC

## 2014-12-27 MED ORDER — INSULIN GLARGINE 100 UNIT/ML ~~LOC~~ SOLN: 12.0000 [IU] | Freq: Two times a day (BID) | SUBCUTANEOUS | Status: AC

## 2014-12-27 MED ORDER — INSULIN ASPART 100 UNIT/ML ~~LOC~~ SOLN: 5.0000 [IU] | Freq: Three times a day (TID) | SUBCUTANEOUS | Status: AC

## 2014-12-27 MED ORDER — FREE WATER: 200.0000 mL | Freq: Four times a day (QID) | Status: AC

## 2014-12-27 MED ORDER — CETYLPYRIDINIUM CHLORIDE 0.05 % MT LIQD: 7.0000 mL | Freq: Four times a day (QID) | OROMUCOSAL | Status: AC

## 2014-12-27 MED ORDER — PANTOPRAZOLE SODIUM 40 MG PO PACK: 40.0000 mg | PACK | ORAL | Status: AC

## 2014-12-27 MED ORDER — AMLODIPINE BESYLATE 10 MG PO TABS: 10.0000 mg | ORAL_TABLET | Freq: Every day | ORAL | Status: AC

## 2014-12-27 MED ORDER — FOLIC ACID 1 MG PO TABS: 1.0000 mg | ORAL_TABLET | Freq: Every day | ORAL | Status: AC

## 2014-12-27 MED ORDER — METOPROLOL TARTRATE 25 MG/10 ML ORAL SUSPENSION: 100.0000 mg | Freq: Two times a day (BID) | ORAL | Status: AC

## 2014-12-27 MED ORDER — VITAL AF 1.2 CAL PO LIQD: 1000.0000 mL | ORAL | Status: AC

## 2014-12-27 MED ORDER — CHLORHEXIDINE GLUCONATE 0.12 % MT SOLN: 15.0000 mL | Freq: Two times a day (BID) | OROMUCOSAL | Status: AC

## 2014-12-27 MED ORDER — RISPERIDONE 1 MG/ML PO SOLN: 0.2500 mg | Freq: Two times a day (BID) | ORAL | Status: AC

## 2014-12-27 MED ORDER — FERROUS SULFATE 300 (60 FE) MG/5ML PO SYRP: 300.0000 mg | ORAL_SOLUTION | Freq: Three times a day (TID) | ORAL | Status: AC

## 2014-12-27 MED ORDER — THIAMINE HCL 100 MG PO TABS: 100.0000 mg | ORAL_TABLET | Freq: Every day | ORAL | Status: AC

## 2014-12-27 MED ORDER — INSULIN ASPART 100 UNIT/ML ~~LOC~~ SOLN: 0.0000 [IU] | SUBCUTANEOUS | Status: AC

## 2014-12-27 NOTE — Progress Notes (Signed)
Clinical Social Work Department CLINICAL SOCIAL WORK PLACEMENT NOTE 12/27/2014  Patient:  Gina Cox, Gina Cox  Account Number:  [id number] Admit date:  12/04/2014  Clinical Social Worker:  Elray Buba, CLINICAL SOCIAL WORKER  Date/time:  12/24/2014 02:43 PM  Clinical Social Work is seeking post-discharge placement for this patient at the following level of care:   SKILLED NURSING   (*CSW will update this form in Epic as items are completed)   12/24/2014  Patient/family provided with [facility] Department of Clinical Social Work's list of facilities offering this level of care within the geographic area requested by the patient (or if unable, by the patient's family).  12/24/2014  Patient/family informed of their freedom to choose among providers that offer the needed level of care, that participate in Medicare, Medicaid or managed care program needed by the patient, have an available bed and are willing to accept the patient.  12/24/2014  Patient/family informed of [facility]' ownership interest in [facility], as well as of the fact that they are under no obligation to receive care at this facility.  PASARR submitted to EDS on 12/24/2014 PASARR number received on 12/24/2014  FL2 transmitted to all facilities in geographic area requested by pt/family on  12/24/2014 FL2 transmitted to all facilities within larger geographic area on   Patient informed that his/her managed care company has contracts with or will negotiate with  certain facilities, including the following:     Patient/family informed of bed offers received:  12/25/2014 Patient chooses bed at [facility] Physician recommends and patient chooses bed at    Patient to be transferred to [facility] on  12/27/2014 Patient to be transferred to facility by [facility] Patient and family notified of transfer on 12/27/2014 Name of family member notified:  DAUGHTER  The following physician  request were entered in Epic:   Additional Comments: Pt ready for d/c to [facility] today. Daughter contacted and is in agreement with d/c plan.  Today's PN's and D/C Summary sent to SNF for review prior to d/c. Admissions Cooordinator  confirmed SNF was ready for pt. [facility] transport required.  Cori Razor LCSW [phone]

## 2014-12-27 NOTE — Progress Notes (Signed)
Patient discharged to [facility] at 1245 via non emergency ambulance. Patient is awake,alert,stable on d/c.

## 2014-12-27 NOTE — Progress Notes (Signed)
Rt placed pt on room air per MD order sats 95%.

## 2014-12-27 NOTE — Progress Notes (Signed)
Report given to nurse at [facility] S. LPN. Patient is stable ,on RA sats 98 %. Fleaxiseal removed, patient tolerated the procedure.

## 2014-12-27 NOTE — Progress Notes (Signed)
Can dc vanc Dc lactulose Trial RA Ok for dc to SNF with FU in trach clinic for trach changes. Limited code  Oretha Milch MD

## 2014-12-28 LAB — CULTURE, BLOOD (ROUTINE X 2): CULTURE: NO GROWTH

## 2015-02-14 DEATH — deceased

## 2015-03-07 ENCOUNTER — Inpatient Hospital Stay (HOSPITAL_COMMUNITY): Admit: 2015-03-07 | Payer: Medicaid Other

## 2016-11-09 IMAGING — CT CT HEAD W/O CM
2 series · 17 of 30 positions shown, 20 images · non-contrast
Comparison: 08/01/2012

CLINICAL DATA: Found unresponsive.  Hypoglycemia

EXAM:
CT HEAD WITHOUT CONTRAST
TECHNIQUE: Contiguous axial images were obtained from the base of the skull
through the vertex without intravenous contrast.

[Series 2: head w/o · axial · non-contrast · 0.40mm/px · z∈[+1428,+1548]mm · 9 of 32 slices shown, 12 images]
[im 4/32  brain]
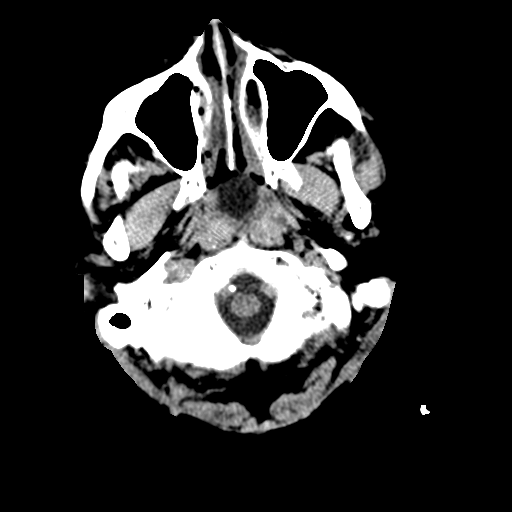
[im 4/32  bone]
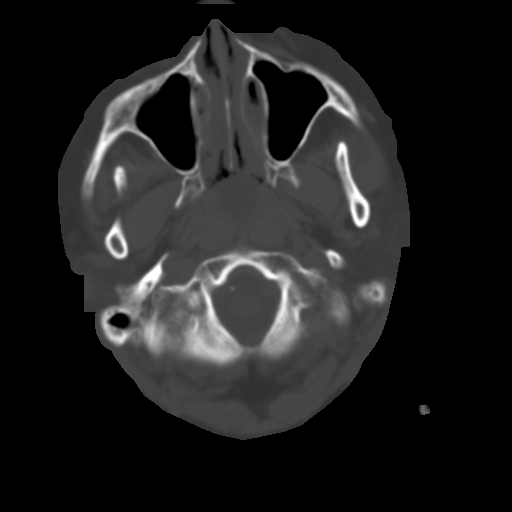
[im 7/32  brain]
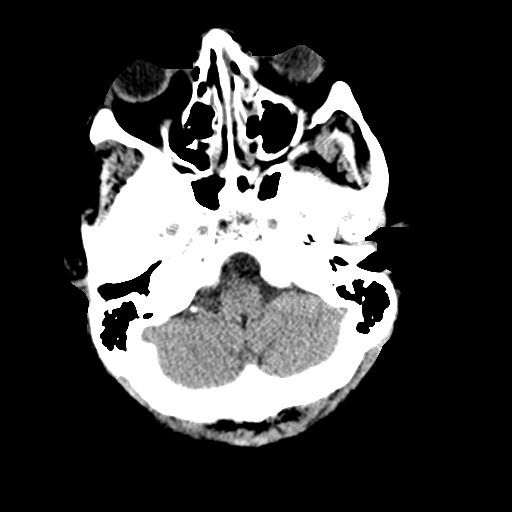
[im 10/32  brain]
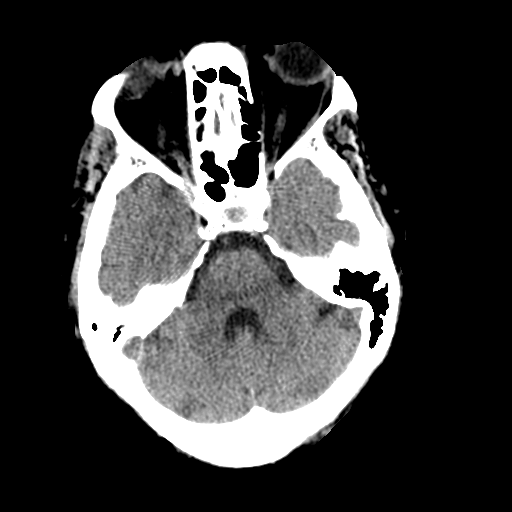
[im 13/32  brain]
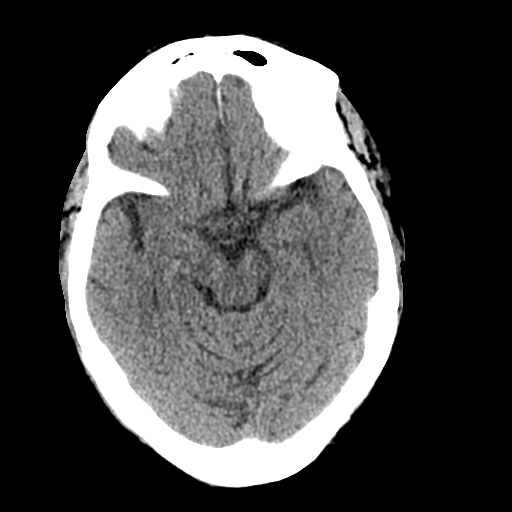
[im 16/32  brain]
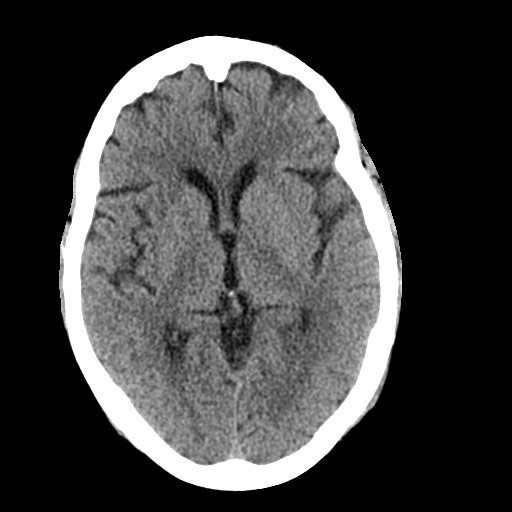
[im 16/32  bone]
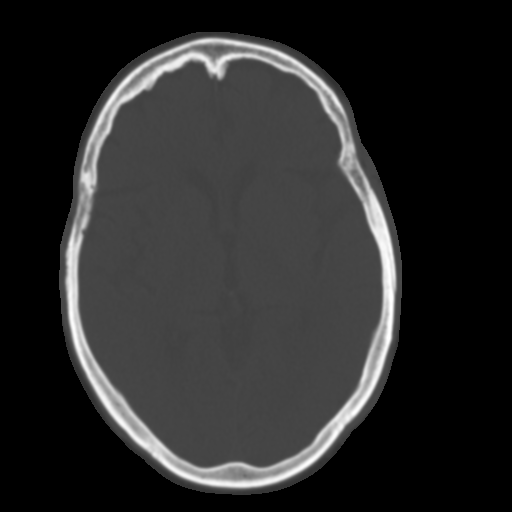
[im 19/32  brain]
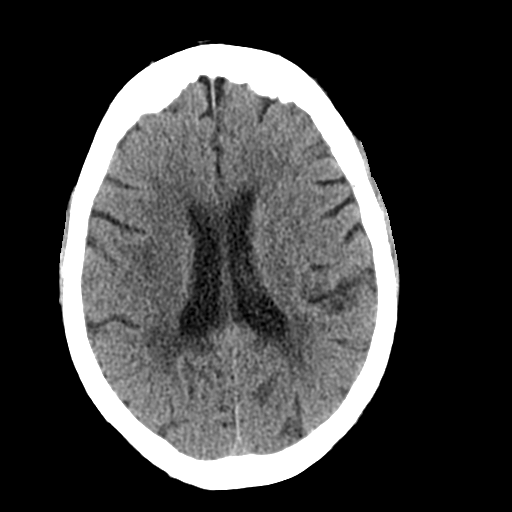
[im 22/32  brain]
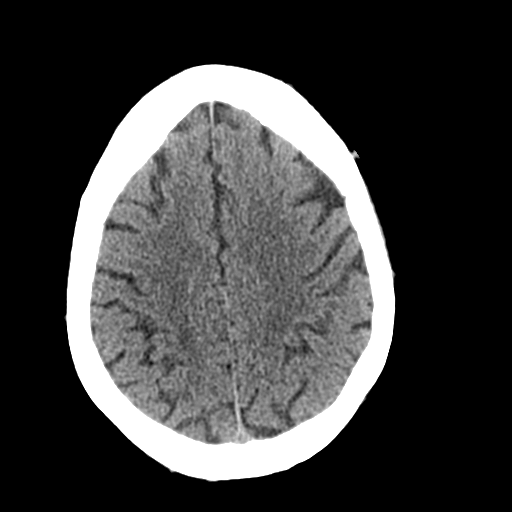
[im 25/32  brain]
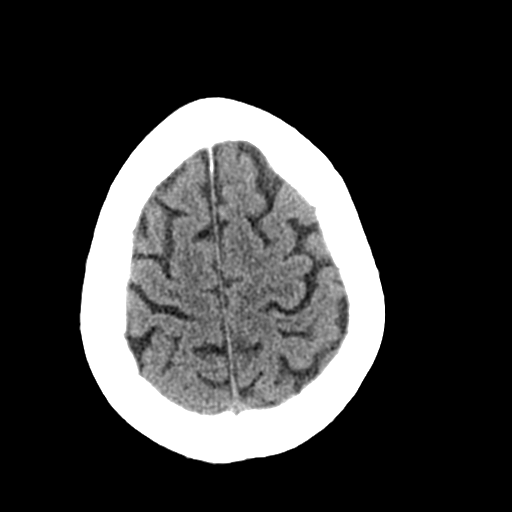
[im 28/32  brain]
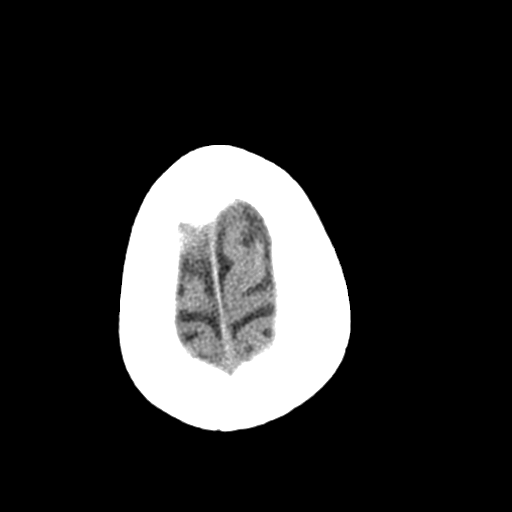
[im 28/32  bone]
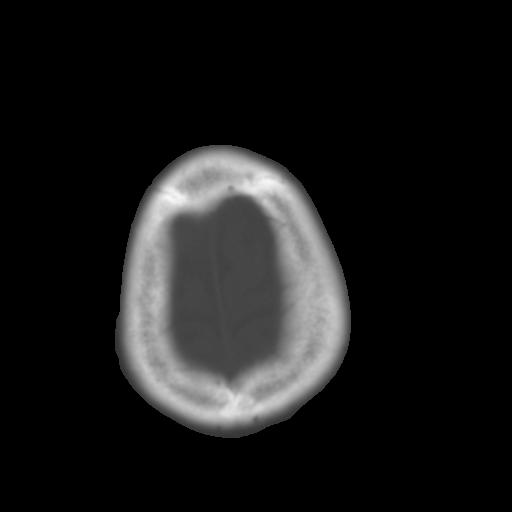

[Series 3: bone windows · axial · 0.40mm/px · z∈[+1428,+1551]mm · 8 of 53 slices shown]
[im 6/53  bone]
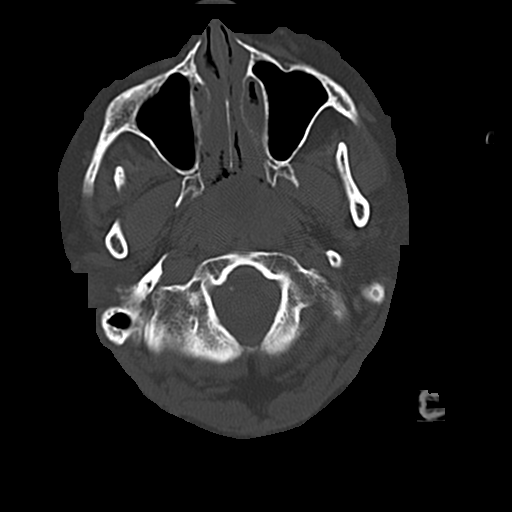
[im 12/53  bone]
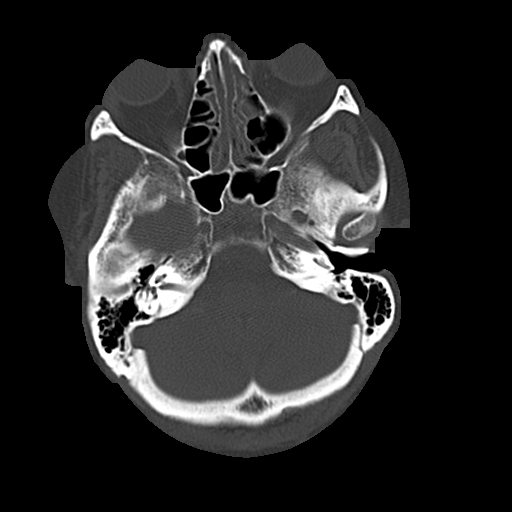
[im 18/53  bone]
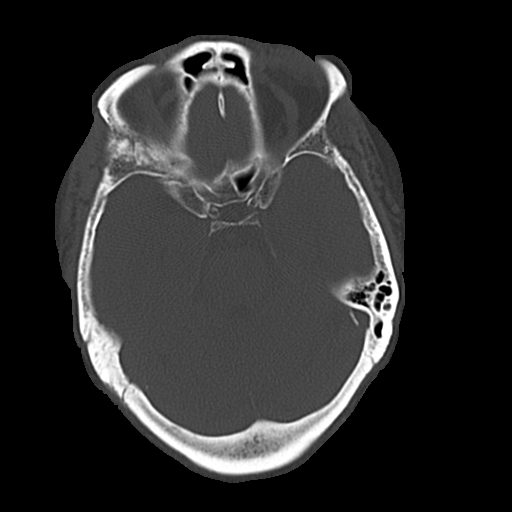
[im 24/53  bone]
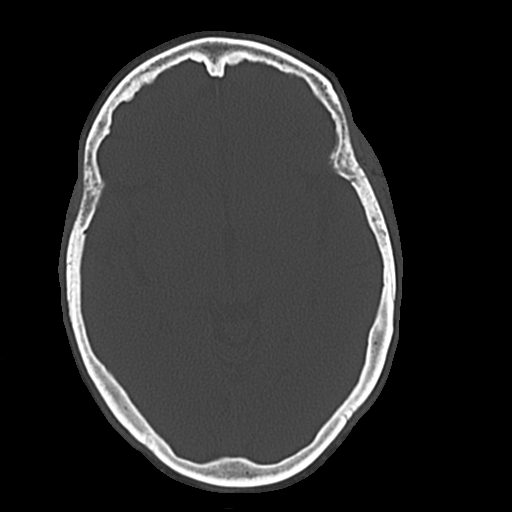
[im 29/53  bone]
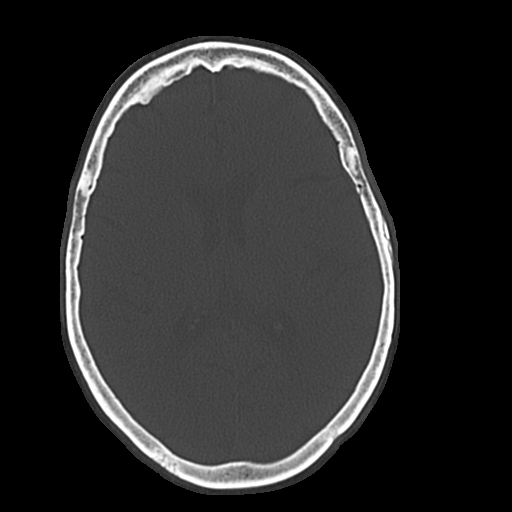
[im 35/53  bone]
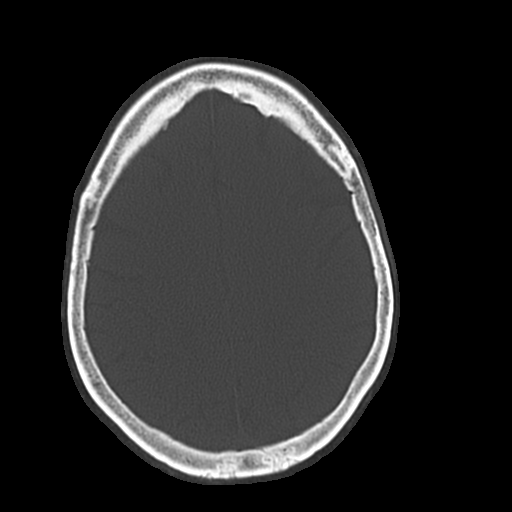
[im 41/53  bone]
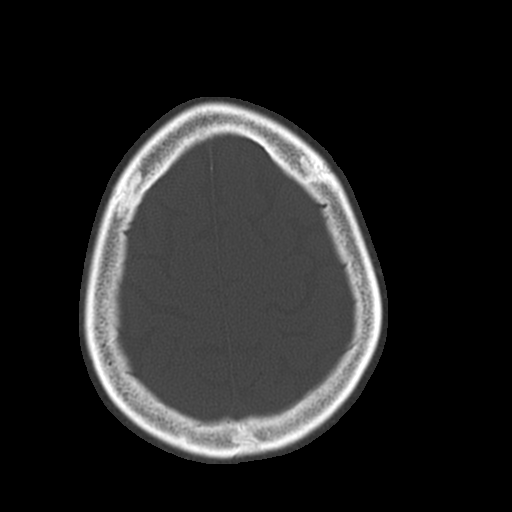
[im 47/53  bone]
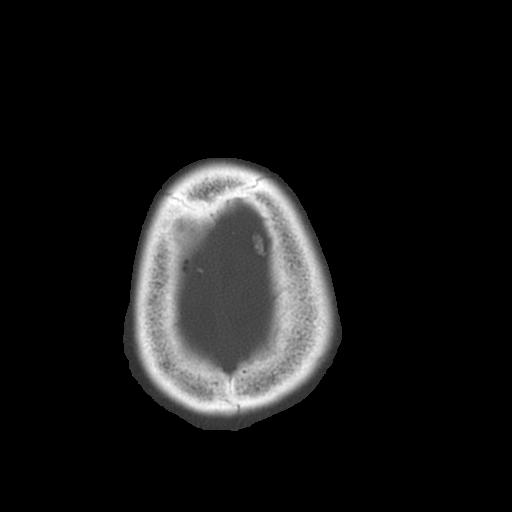

[17 of 30 positions shown; findings below may reference images not displayed]

FINDINGS: There is no intracranial hemorrhage, mass or evidence of acute
infarction. There is mild generalized atrophy. There is mild chronic
microvascular ischemic change. There is no significant extra-axial
fluid collection.

No acute intracranial findings are evident.

There is mild foamy secretions in the left sphenoid sinus.
IMPRESSION: No acute intracranial findings. There is mild generalized atrophy
and chronic microvascular ischemic change.
